# Patient Record
Sex: Female | Born: 1985 | Race: Black or African American | Hispanic: No | Marital: Single | Smoking: Former smoker
Health system: Southern US, Community
[De-identification: ages and names within clinical notes are randomized; demographics above are authoritative.]

## PROBLEM LIST (undated history)

## (undated) ENCOUNTER — Inpatient Hospital Stay (HOSPITAL_COMMUNITY): Payer: Self-pay

## (undated) DIAGNOSIS — N898 Other specified noninflammatory disorders of vagina: Secondary | ICD-10-CM

## (undated) DIAGNOSIS — O24419 Gestational diabetes mellitus in pregnancy, unspecified control: Secondary | ICD-10-CM

## (undated) DIAGNOSIS — F32A Depression, unspecified: Secondary | ICD-10-CM

## (undated) DIAGNOSIS — R350 Frequency of micturition: Secondary | ICD-10-CM

## (undated) DIAGNOSIS — B379 Candidiasis, unspecified: Secondary | ICD-10-CM

## (undated) DIAGNOSIS — F419 Anxiety disorder, unspecified: Secondary | ICD-10-CM

## (undated) DIAGNOSIS — B009 Herpesviral infection, unspecified: Secondary | ICD-10-CM

## (undated) DIAGNOSIS — E559 Vitamin D deficiency, unspecified: Secondary | ICD-10-CM

## (undated) DIAGNOSIS — N760 Acute vaginitis: Secondary | ICD-10-CM

## (undated) DIAGNOSIS — B9689 Other specified bacterial agents as the cause of diseases classified elsewhere: Secondary | ICD-10-CM

## (undated) DIAGNOSIS — N926 Irregular menstruation, unspecified: Secondary | ICD-10-CM

## (undated) DIAGNOSIS — F329 Major depressive disorder, single episode, unspecified: Secondary | ICD-10-CM

## (undated) HISTORY — DX: Other specified bacterial agents as the cause of diseases classified elsewhere: B96.89

## (undated) HISTORY — DX: Irregular menstruation, unspecified: N92.6

## (undated) HISTORY — DX: Gestational diabetes mellitus in pregnancy, unspecified control: O24.419

## (undated) HISTORY — PX: WISDOM TOOTH EXTRACTION: SHX21

## (undated) HISTORY — DX: Other specified noninflammatory disorders of vagina: N89.8

## (undated) HISTORY — DX: Frequency of micturition: R35.0

## (undated) HISTORY — DX: Acute vaginitis: N76.0

## (undated) HISTORY — DX: Anxiety disorder, unspecified: F41.9

## (undated) HISTORY — DX: Major depressive disorder, single episode, unspecified: F32.9

## (undated) HISTORY — DX: Candidiasis, unspecified: B37.9

## (undated) HISTORY — DX: Depression, unspecified: F32.A

## (undated) HISTORY — DX: Vitamin D deficiency, unspecified: E55.9

---
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
## 2004-04-05 ENCOUNTER — Ambulatory Visit (HOSPITAL_COMMUNITY): Admission: RE | Admit: 2004-04-05 | Discharge: 2004-04-05 | Payer: Self-pay | Admitting: Obstetrics and Gynecology

## 2004-04-08 ENCOUNTER — Inpatient Hospital Stay (HOSPITAL_COMMUNITY): Admission: RE | Admit: 2004-04-08 | Discharge: 2004-04-10 | Payer: Self-pay | Admitting: Obstetrics and Gynecology

## 2004-06-13 ENCOUNTER — Emergency Department (HOSPITAL_COMMUNITY): Admission: EM | Admit: 2004-06-13 | Discharge: 2004-06-13 | Payer: Self-pay | Admitting: Emergency Medicine

## 2004-08-19 ENCOUNTER — Emergency Department (HOSPITAL_COMMUNITY): Admission: EM | Admit: 2004-08-19 | Discharge: 2004-08-19 | Payer: Self-pay | Admitting: *Deleted

## 2005-09-09 ENCOUNTER — Inpatient Hospital Stay (HOSPITAL_COMMUNITY): Admission: RE | Admit: 2005-09-09 | Discharge: 2005-09-11 | Payer: Self-pay | Admitting: Obstetrics and Gynecology

## 2006-11-21 ENCOUNTER — Other Ambulatory Visit: Admission: RE | Admit: 2006-11-21 | Discharge: 2006-11-21 | Payer: Self-pay | Admitting: Obstetrics and Gynecology

## 2007-08-19 ENCOUNTER — Emergency Department (HOSPITAL_COMMUNITY): Admission: EM | Admit: 2007-08-19 | Discharge: 2007-08-19 | Payer: Self-pay | Admitting: Emergency Medicine

## 2007-10-27 ENCOUNTER — Ambulatory Visit: Payer: Self-pay | Admitting: Psychiatry

## 2007-10-28 ENCOUNTER — Inpatient Hospital Stay (HOSPITAL_COMMUNITY): Admission: AD | Admit: 2007-10-28 | Discharge: 2007-11-11 | Payer: Self-pay | Admitting: Psychiatry

## 2007-11-27 ENCOUNTER — Emergency Department (HOSPITAL_COMMUNITY): Admission: EM | Admit: 2007-11-27 | Discharge: 2007-11-27 | Payer: Self-pay | Admitting: Emergency Medicine

## 2007-12-05 ENCOUNTER — Other Ambulatory Visit: Admission: RE | Admit: 2007-12-05 | Discharge: 2007-12-05 | Payer: Self-pay | Admitting: Obstetrics and Gynecology

## 2008-11-05 ENCOUNTER — Emergency Department (HOSPITAL_COMMUNITY): Admission: EM | Admit: 2008-11-05 | Discharge: 2008-11-05 | Payer: Self-pay | Admitting: Emergency Medicine

## 2008-12-28 ENCOUNTER — Emergency Department (HOSPITAL_COMMUNITY): Admission: EM | Admit: 2008-12-28 | Discharge: 2008-12-28 | Payer: Self-pay | Admitting: Emergency Medicine

## 2009-02-03 ENCOUNTER — Other Ambulatory Visit: Admission: RE | Admit: 2009-02-03 | Discharge: 2009-02-03 | Payer: Self-pay | Admitting: Obstetrics and Gynecology

## 2009-11-26 ENCOUNTER — Inpatient Hospital Stay (HOSPITAL_COMMUNITY)
Admission: AD | Admit: 2009-11-26 | Discharge: 2009-11-28 | Payer: Self-pay | Source: Home / Self Care | Admitting: Obstetrics & Gynecology

## 2009-11-26 ENCOUNTER — Ambulatory Visit: Payer: Self-pay | Admitting: Family Medicine

## 2010-02-16 ENCOUNTER — Emergency Department (HOSPITAL_COMMUNITY)
Admission: EM | Admit: 2010-02-16 | Discharge: 2010-02-16 | Payer: Self-pay | Source: Home / Self Care | Admitting: Emergency Medicine

## 2010-02-16 ENCOUNTER — Other Ambulatory Visit
Admission: RE | Admit: 2010-02-16 | Discharge: 2010-02-16 | Payer: Self-pay | Source: Home / Self Care | Admitting: Obstetrics and Gynecology

## 2010-05-14 LAB — CBC
MCH: 31.6 pg (ref 26.0–34.0)
MCH: 32.6 pg (ref 26.0–34.0)
MCV: 95.2 fL (ref 78.0–100.0)
MCV: 96 fL (ref 78.0–100.0)
Platelets: 173 10*3/uL (ref 150–400)
RBC: 3.58 MIL/uL — ABNORMAL LOW (ref 3.87–5.11)
RBC: 4.03 MIL/uL (ref 3.87–5.11)
RDW: 15.1 % (ref 11.5–15.5)
WBC: 14.1 10*3/uL — ABNORMAL HIGH (ref 4.0–10.5)

## 2010-06-04 LAB — URINALYSIS, ROUTINE W REFLEX MICROSCOPIC
Glucose, UA: NEGATIVE mg/dL
Nitrite: NEGATIVE
Specific Gravity, Urine: 1.015 (ref 1.005–1.030)
pH: 7 (ref 5.0–8.0)

## 2010-06-04 LAB — COMPREHENSIVE METABOLIC PANEL
AST: 17 U/L (ref 0–37)
Albumin: 3.8 g/dL (ref 3.5–5.2)
Alkaline Phosphatase: 54 U/L (ref 39–117)
BUN: 12 mg/dL (ref 6–23)
Calcium: 8.9 mg/dL (ref 8.4–10.5)
Chloride: 104 mEq/L (ref 96–112)
Creatinine, Ser: 0.61 mg/dL (ref 0.4–1.2)
Sodium: 137 mEq/L (ref 135–145)
Total Bilirubin: 1 mg/dL (ref 0.3–1.2)
Total Protein: 6.6 g/dL (ref 6.0–8.3)

## 2010-06-04 LAB — CBC
Hemoglobin: 13.1 g/dL (ref 12.0–15.0)
Platelets: 186 10*3/uL (ref 150–400)
RDW: 13.2 % (ref 11.5–15.5)
WBC: 8.5 10*3/uL (ref 4.0–10.5)

## 2010-06-04 LAB — DIFFERENTIAL
Basophils Absolute: 0 10*3/uL (ref 0.0–0.1)
Lymphs Abs: 3.5 10*3/uL (ref 0.7–4.0)
Neutro Abs: 3.8 10*3/uL (ref 1.7–7.7)

## 2010-06-05 LAB — URINALYSIS, ROUTINE W REFLEX MICROSCOPIC
Nitrite: NEGATIVE
pH: 6 (ref 5.0–8.0)

## 2010-06-05 LAB — URINE CULTURE

## 2010-06-05 LAB — RAPID STREP SCREEN (MED CTR MEBANE ONLY): Streptococcus, Group A Screen (Direct): NEGATIVE

## 2010-06-05 LAB — URINE MICROSCOPIC-ADD ON

## 2010-06-05 LAB — PREGNANCY, URINE: Preg Test, Ur: NEGATIVE

## 2010-07-14 NOTE — H&P (Signed)
[name], [name]            ACCOUNT NO.:  [id number]   MEDICAL RECORD NO.:  [id number]          PATIENT TYPE:  IPS   LOCATION:  0403                          FACILITY:  [facility]   PHYSICIAN:  Anselm Jungling, MD  DATE OF BIRTH:  26-Dec-1985   DATE OF ADMISSION:  10/28/2007  DATE OF DISCHARGE:                       PSYCHIATRIC ADMISSION ASSESSMENT   This is an involuntary admission to the services of Anselm Jungling,  M.D.  This is a 25 year old single [place] American female whose most  notable attribute is her multicolored hair.  It is quite unusual.  The  commitment papers indicate that the respondent is psychotic, she was  responding to internal stimuli and currently a danger to herself.  She  was brought to the emergency room at [facility] with a chief  complaint of altered level of consciousness.  According to the patient's  sister, the patient had been threatening suicide.  She had been under a  lot of family stress.  She is known to use marijuana; indeed, her UDS  was positive for marijuana.  She had no alcohol, and her UA showed that  she was beginning a UTI.  She had a small amount of leukocyte esterase  positive.  She is on her period, and she is concerned about STDs and  wants to be checked.  Yesterday, prior to getting a shot of Geodon, she  was somewhat combative.  She had to be restrained.  Apparently she has  not been sleeping well and has not been eating well for the last week or  so, and the Geodon seemed to have brought her around.  She received 10  mg IM in the ED and she has not required any further medication.   PAST PSYCHIATRIC HISTORY:  She presented to her [facility], Dr. Sudie Bailey, in  February.  He started her on Lexapro.  She became noncompliant with the  Lexapro approximately 2 months ago.  No real reason other than she had  not been back for followup and did not have refills.  She states that it  was helpful.   SOCIAL HISTORY:  She is a high school  graduate in 2005.  She is not  married.  She has a 71-year-old son and a 67-year-old daughter.  Their  father is incarcerated due to drugs.  The children are with her sister.  She has been unemployed since last year, and apparently various family  members are financially supporting her and the children.   FAMILY HISTORY:  She thinks her mother has anxiety.  She is not sure  what medications her mother is prescribed.   ALCOHOL AND DRUG HISTORY:  She states that she only uses marijuana on  and off.   PRIMARY CARE [name]:  Mila Homer. Sudie Bailey, M.D.   MEDICAL PROBLEMS:  None that she is aware of.   MEDICATIONS:  She was prescribed Lexapro 20 mg but she has not taken  this in about 2 months.   DRUG ALLERGIES:  No known drug allergies.   POSITIVE PHYSICAL FINDINGS:  She was medically cleared in the ED at  [facility].  She  does have a UTI as noted with the small amount of  leukocyte esterase positive in her urine.  The remainder of her lab work  was unremarkable.  Her vital signs on admission to the unit show that  she is 62 inches tall, she weighs 153, her temperature is 98.6, her  blood pressure was 123/69 to 127/81, pulse was 89 to 97, respirations  18.  She does have several tattoos.  Please see anatomic drawing for  location and description.  She does have a history for multiple STDs and  she is complaining today of vaginal itch.   MENTAL STATUS EXAM:  Today she is alert and oriented.  She is  appropriately groomed, dressed, and appears to be adequately nourished.  Her speech was a normal rate, rhythm and tone.  Her mood is calm.  It is  appropriate to the situation.  Her affect is congruent.  Thought  processes are clear, rational and goal oriented.  She would like to  restart the Lexapro.  Judgment and insight are good.  Concentration and  memory are intact.  Intelligence is at least average.  She denies being  suicidal or homicidal.  She denies auditory or visual  hallucinations.  She states that the Geodon cleared that up.   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  Marijuana abuse.  AXIS II:  Deferred.  AXIS III:  History for STDs, currently has urinary tract infection.  AXIS IV:  Severe financial issues and unemployment.  AXIS V:  30.   The plan is to admit for safety and stabilization.  We will restart her  Lexapro.  We will treat her UTI and rule out any STDs.  Estimated length  of stay is 3 days.      Mickie Leonarda Salon, P.A.-C.      Anselm Jungling, MD  Electronically Signed    MD/MEDQ  D:  10/28/2007  T:  10/28/2007  Job:  [id number]

## 2010-07-14 NOTE — Discharge Summary (Signed)
[name], SEEDORF            ACCOUNT NO.:  [id number]   MEDICAL RECORD NO.:  [id number]          PATIENT TYPE:  IPS   LOCATION:  0400                          FACILITY:  [facility]   PHYSICIAN:  Anselm Jungling, MD  DATE OF BIRTH:  [birth year]   DATE OF ADMISSION:  10/28/2007  DATE OF DISCHARGE:  11/11/2007                               DISCHARGE SUMMARY   IDENTIFYING DATA AND REASON FOR ADMISSION:  The patient is a 25 year old  single African American female who was admitted due to psychotic  symptoms.  Please refer to the admission note for further details  pertaining to the symptoms, circumstances and history that led to her  hospitalization.  She was given an initial Axis I diagnosis of psychosis  not otherwise specified.   MEDICAL AND LABORATORY:  The patient was admitted with a urinary tract  infection.  She was treated with Bactrim DS 1 tablet b.i.d.  There were  no other medical issues.   HOSPITAL COURSE:  The patient was admitted to the adult inpatient  psychiatric service.  She presented as a well-nourished, normally-  developed [place] American female with multicolored hair.  She showed  vague and latent responses.  She could not explain why she was in the  hospital.  She made vague references to not being able to control her  behavior, but was unable to be specific.  She appeared to have limited  insight into mental illness.   She was treated with a psychotropic regimen initially consisting of  Risperdal.  This was well tolerated, but the patient made minimal  progress.  After approximately 9 days of inpatient treatment, it was  noted that she was beginning to display an affective component,  consisting of a good deal of inappropriate laughter, mild euphoria and  attempting to play childish tricks on the staff and then laughing about  it.  Because of this, Depakote was added to the regimen.  Within 3 days,  the patient's affect had stabilized significantly, her  thinking was more  realistic and she reported that she was feeling better.   The case manager worked with the patient's family towards discharge and  aftercare planning.  This involved the patient's aunt, who had been away  in the early part of the patient's stay, but was able to be involved in  the latter part of the patient's stay.   The patient was discharged on the 15th hospital day, much improved.  She  agreed to the following aftercare plan.   AFTERCARE:  The patient was to follow up with [facility] with  an appointment on November 15, 2007.   DISCHARGE MEDICATIONS:  1. Lexapro 10 mg daily.  2. Depakote 1000 mg q.h.s.  3. Risperdal 6 mg q.h.s.  4. Septra/Bactrim DS 1 tablet twice daily through September 15.   DISCHARGE DIAGNOSES:  AXIS I:  Schizoaffective disorder not otherwise  specified.  AXIS II:  Deferred.  AXIS III:  Status post urinary tract infection.  AXIS IV:  Stressors severe.  AXIS V:  GAF on discharge 55.      Anselm Jungling,  MD  Electronically Signed     SPB/MEDQ  D:  11/11/2007  T:  11/12/2007  Job:  [id number]

## 2010-07-17 NOTE — H&P (Signed)
[name], [name]            ACCOUNT NO.:  [id number]   MEDICAL RECORD NO.:  [id number]          PATIENT TYPE:  INP   LOCATION:  LDR4                          FACILITY:  [facility]   PHYSICIAN:  [name], M.D. DATE OF BIRTH:  Apr 12, 1985   DATE OF ADMISSION:  09/09/2005  DATE OF DISCHARGE:  LH                                HISTORY & PHYSICAL   REASON FOR ADMISSION:  Pregnancy at 39 weeks, in active labor.   HISTORY OF PRESENT ILLNESS:  [name] was awakened about midnight with  uterine contractions and now presents at 6 cm, in active labor.   MEDICAL HISTORY:  Negative.   SURGICAL HISTORY:  Negative.   ALLERGIES:  She has no known allergies.   MEDICATIONS:  Prenatal vitamins and Valtrex.   FAMILY HISTORY:  Family history is positive for hypertension, coronary heart  disease and diabetes.   PRENATAL COURSE:  Uneventful.  Blood type is A positive.  UDS negative.  Rubella is immune.  Hepatitis B surface antigen negative.  HIV is negative.  HSV is positive.  Serology is nonreactive.  Pap normal.  GC and Chlamydia  are both negative.  Prior GBS was positive.  Twenty-eight-week hemoglobin  10.1, hematocrit 31.5.  One-hour glucose 153; three-hour is as follows --  79, 183, 147 and 108.   PHYSICAL EXAMINATION:  VITAL SIGNS:  Stable.  PELVIC:  Cervix 6 cm, 0 station, bulging membranes, active labor.  ABDOMEN:  Fetus is stable and has reactive heart rhythm.   PLAN:  Anticipate vaginal delivery.     [name], [name]      [name], M.D.  Electronically Signed   DL/MEDQ  D:  04/54/0981  T:  09/09/2005  Job:  [id number]

## 2010-07-17 NOTE — H&P (Signed)
NAMEREWA, WEISSBERG            ACCOUNT NO.:  [id number]   MEDICAL RECORD NO.:  [id number]          PATIENT TYPE:  INP   LOCATION:  LDR1                          [facility]ILITY:  [facility]   PHYSICIAN:  Tilda Burrow, M.D. DATE OF BIRTH:  [birth year]   DATE OF ADMISSION:  04/08/2004  DATE OF DISCHARGE:  LH                                HISTORY & PHYSICAL   LENGTH OF FIRST STAGE LABOR:  Eight hours and 25 minutes.   LENGTH OF SECOND STAGE LABOR:  26 minutes.   LENGTH OF THIRD STAGE LABOR:  11 minutes.   DELIVERY NOTE:  Elysha had a spontaneous vaginal delivery from an OP  position of a viable female infant.  Upon delivery of head, nuchal cord was  noted x1 which was loosened easily and infant slipped through without any  difficulty.  Upon delivery infant had spontaneous movement of all  extremities, good strong cry, and pinked up readily after delivery.  Infant  was thoroughly suctioned and dried, cord clamped and cut, and placed on  mother's abdomen in good condition.  Upon inspection perineum is noted to be  intact.  Third stage of labor was actively managed with 20 units of Pitocin  and 1000 cc of crystalloid_at a rapid rate.  Placenta was delivered  spontaneously via Tomasa Blase' mechanism.  Three vessel cord is noted upon  inspection.  Membranes are noted to be intact upon inspection.  Estimated  blood loss is approximately 350 cc.  Infant and mother stabilized and  transferred out to the postpartum unit in stable condition.      DL/MEDQ  D:  62/95/2841  T:  04/08/2004  Job:  [id number]

## 2010-07-17 NOTE — H&P (Signed)
NAMEADREENA, [name]            ACCOUNT NO.:  [id number]   MEDICAL RECORD NO.:  [id number]          PATIENT TYPE:  INP   LOCATION:  LDR1                          [facility]  [facility]   PHYSICIAN:  Tilda Burrow, M.D. DATE OF BIRTH:  1986-01-08   DATE OF ADMISSION:  04/08/2004  DATE OF DISCHARGE:  LH                                HISTORY & PHYSICAL   REASON FOR ADMISSION:  Pregnancy at 38 weeks approximately with early labor.   HISTORY OF PRESENT ILLNESS:  [name] presented at 3 a.m. this morning  having mild contractions about 5-10 minutes apart.  At that time she was 1  cm.  Now at 9:20 a.m. she is 3 cm, 90% effaced, and 0 station.  The  membranes are bulging.  The contractions are still approximately 5-7 minutes  apart, mild-to-moderate to palpation, but she is making cervical change.   MEDICAL HISTORY:  Positive for migraines.   SURGICAL HISTORY:  Negative.   ALLERGIES:  She has no known allergies.   MEDICATIONS:  1.  Valtrex.  2.  Prenatal vitamins.   FAMILY HISTORY:  Positive for hypertension, diabetes, and coronary artery  disease.   PRENATAL COURSE:  Essentially uneventful.  Blood type is A positive.  Rubella is immune.  Hepatitis B surface antigen is negative.  HIV is  negative.  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  Serology is nonreactive.  AFP is within  normal limits.  A 28-week hemoglobin is 11, hematocrit 34.9.  A one hour  glucose is 160.  A three hour is as follows:  166, 163, and 134.  Sickle  cell screen is negative.  She is GBF positive.   PHYSICAL EXAMINATION:  VITAL SIGNS:  Stable.  Fetal heart rate is stable  with A cells.  PELVIC:  Cervix is 3, 90, and 0 station.  Membranes are bulging.  Contraction pattern is still mild, anywhere from 5-7 minutes apart.   PLAN:  We are going admit, ambulate.  Start IV antibiotics for positive GBS  status and probable Pitocin augmentation of labor.      DL/MEDQ  D:  16/11/9602  T:  04/08/2004  Job:  [id number]   cc:   [facility]

## 2010-07-17 NOTE — Op Note (Signed)
[name], [name]            ACCOUNT NO.:  [id number]   MEDICAL RECORD NO.:  [id number]          PATIENT TYPE:  INP   LOCATION:  LDR4                          FACILITY:  [facility]   PHYSICIAN:  Tilda Burrow, M.D. DATE OF BIRTH:  01/20/1986   DATE OF PROCEDURE:  DATE OF DISCHARGE:                                  PROCEDURE NOTE   DELIVERY SUMMARY   ONSET OF LABOR:  September 09, 2005.   DATE OF DELIVERY:  September 09, 2005, at 03:12 a.m.   LENGTH OF FIRST STAGE LABOR:  3-1/2 hours.   LENGTH OF SECOND STAGE LABOR:  14 minutes.   LENGTH OF THIRD STAGE LABOR:  8 minutes.   DELIVERY NOTE:  [name] had a normal spontaneous vaginal livery of a viable  female infant.  There was spontaneous delivery of entire infant.  Upon  delivery infant had strong cry, good movement, pinked up well.  Cord was  clamped and cut.  Infant suctioned, thoroughly dried, and placed _on warmer_  for newborn care.  Upon inspection perineum was noted to be intact.  Third  stage of labor was __ managed with 20 units of Pitocin, 1000 mL of _infusion  at a_ rapid rate.  The placenta was delivered spontaneously via Schultze  mechanism.  Membranes were noted to be intact.  There is _3-vessel_ cord.  Cord blood gas and cord blood was obtained and sent to the lab.  Estimated  blood loss was approximately 400 mL.      [name], Lanier Clam      Tilda Burrow, M.D.  Electronically Signed    DL/MEDQ  D:  16/11/9602  T:  09/09/2005  Job:  [id number]   cc:   Jeoffrey Massed, MD  Fax: [phone]

## 2010-11-24 ENCOUNTER — Other Ambulatory Visit: Payer: Self-pay | Admitting: Obstetrics and Gynecology

## 2010-11-26 LAB — RAPID URINE DRUG SCREEN, HOSP PERFORMED
Barbiturates: NOT DETECTED
Cocaine: NOT DETECTED
Opiates: NOT DETECTED
Tetrahydrocannabinol: POSITIVE — AB

## 2010-11-26 LAB — URINALYSIS, ROUTINE W REFLEX MICROSCOPIC
Bilirubin Urine: NEGATIVE
Nitrite: NEGATIVE
Specific Gravity, Urine: 1.025
pH: 6

## 2010-12-02 LAB — URINE CULTURE: Special Requests: NEGATIVE

## 2010-12-02 LAB — URINALYSIS, ROUTINE W REFLEX MICROSCOPIC
Bilirubin Urine: NEGATIVE
Glucose, UA: NEGATIVE
Specific Gravity, Urine: 1.027
pH: 6.5

## 2010-12-02 LAB — URINE MICROSCOPIC-ADD ON

## 2011-04-12 ENCOUNTER — Other Ambulatory Visit: Payer: Self-pay | Admitting: Obstetrics & Gynecology

## 2011-04-12 ENCOUNTER — Other Ambulatory Visit (HOSPITAL_COMMUNITY)
Admission: RE | Admit: 2011-04-12 | Discharge: 2011-04-12 | Disposition: A | Discharge status: Home / Self Care | Payer: Medicaid Other | Source: Ambulatory Visit | Attending: Obstetrics & Gynecology | Admitting: Obstetrics & Gynecology

## 2011-04-12 DIAGNOSIS — Z113 Encounter for screening for infections with a predominantly sexual mode of transmission: Secondary | ICD-10-CM | POA: Insufficient documentation

## 2011-04-12 DIAGNOSIS — Z01419 Encounter for gynecological examination (general) (routine) without abnormal findings: Secondary | ICD-10-CM | POA: Insufficient documentation

## 2011-05-21 ENCOUNTER — Other Ambulatory Visit: Payer: Self-pay | Admitting: Obstetrics and Gynecology

## 2012-04-24 ENCOUNTER — Other Ambulatory Visit (HOSPITAL_COMMUNITY)
Admission: RE | Admit: 2012-04-24 | Discharge: 2012-04-24 | Disposition: A | Discharge status: Home / Self Care | Payer: Medicaid Other | Source: Ambulatory Visit | Attending: Obstetrics and Gynecology | Admitting: Obstetrics and Gynecology

## 2012-04-24 ENCOUNTER — Other Ambulatory Visit: Payer: Self-pay | Admitting: Adult Health

## 2012-04-24 DIAGNOSIS — Z01419 Encounter for gynecological examination (general) (routine) without abnormal findings: Secondary | ICD-10-CM | POA: Insufficient documentation

## 2012-04-24 DIAGNOSIS — Z113 Encounter for screening for infections with a predominantly sexual mode of transmission: Secondary | ICD-10-CM | POA: Insufficient documentation

## 2012-08-15 ENCOUNTER — Other Ambulatory Visit: Payer: Self-pay | Admitting: *Deleted

## 2012-08-15 MED ORDER — METRONIDAZOLE 250 MG PO TABS: 250.0000 mg | ORAL_TABLET | Freq: Three times a day (TID) | ORAL | Status: DC

## 2012-09-02 ENCOUNTER — Inpatient Hospital Stay (HOSPITAL_COMMUNITY)
Admission: EM | Admit: 2012-09-02 | Discharge: 2012-09-07 | DRG: 419 | Disposition: A | Discharge status: Home / Self Care | Payer: Medicaid Other | Attending: Internal Medicine | Admitting: Internal Medicine

## 2012-09-02 ENCOUNTER — Emergency Department (HOSPITAL_COMMUNITY): Payer: Medicaid Other

## 2012-09-02 ENCOUNTER — Encounter (HOSPITAL_COMMUNITY): Payer: Self-pay | Admitting: Emergency Medicine

## 2012-09-02 DIAGNOSIS — K8050 Calculus of bile duct without cholangitis or cholecystitis without obstruction: Secondary | ICD-10-CM

## 2012-09-02 DIAGNOSIS — R932 Abnormal findings on diagnostic imaging of liver and biliary tract: Secondary | ICD-10-CM

## 2012-09-02 DIAGNOSIS — F329 Major depressive disorder, single episode, unspecified: Secondary | ICD-10-CM | POA: Diagnosis present

## 2012-09-02 DIAGNOSIS — E875 Hyperkalemia: Secondary | ICD-10-CM | POA: Diagnosis present

## 2012-09-02 DIAGNOSIS — F3289 Other specified depressive episodes: Secondary | ICD-10-CM | POA: Diagnosis present

## 2012-09-02 DIAGNOSIS — R3911 Hesitancy of micturition: Secondary | ICD-10-CM | POA: Diagnosis present

## 2012-09-02 DIAGNOSIS — R109 Unspecified abdominal pain: Secondary | ICD-10-CM

## 2012-09-02 DIAGNOSIS — K8071 Calculus of gallbladder and bile duct without cholecystitis with obstruction: Principal | ICD-10-CM | POA: Diagnosis present

## 2012-09-02 DIAGNOSIS — R110 Nausea: Secondary | ICD-10-CM | POA: Diagnosis present

## 2012-09-02 DIAGNOSIS — R1013 Epigastric pain: Secondary | ICD-10-CM | POA: Diagnosis present

## 2012-09-02 DIAGNOSIS — E876 Hypokalemia: Secondary | ICD-10-CM | POA: Diagnosis not present

## 2012-09-02 DIAGNOSIS — K8021 Calculus of gallbladder without cholecystitis with obstruction: Secondary | ICD-10-CM | POA: Diagnosis present

## 2012-09-02 DIAGNOSIS — F17200 Nicotine dependence, unspecified, uncomplicated: Secondary | ICD-10-CM | POA: Diagnosis present

## 2012-09-02 DIAGNOSIS — R112 Nausea with vomiting, unspecified: Secondary | ICD-10-CM | POA: Diagnosis present

## 2012-09-02 LAB — URINALYSIS, ROUTINE W REFLEX MICROSCOPIC
Glucose, UA: NEGATIVE mg/dL
Ketones, ur: NEGATIVE mg/dL
Leukocytes, UA: NEGATIVE
Nitrite: NEGATIVE
Protein, ur: 30 mg/dL — AB
Urobilinogen, UA: 1 mg/dL (ref 0.0–1.0)

## 2012-09-02 LAB — CBC WITH DIFFERENTIAL/PLATELET
Basophils Absolute: 0 10*3/uL (ref 0.0–0.1)
Basophils Relative: 0 % (ref 0–1)
Eosinophils Relative: 1 % (ref 0–5)
HCT: 39.5 % (ref 36.0–46.0)
Hemoglobin: 13.5 g/dL (ref 12.0–15.0)
MCHC: 34.2 g/dL (ref 30.0–36.0)
MCV: 92.3 fL (ref 78.0–100.0)
Monocytes Absolute: 0.7 10*3/uL (ref 0.1–1.0)
Monocytes Relative: 7 % (ref 3–12)
RDW: 13.3 % (ref 11.5–15.5)

## 2012-09-02 LAB — LIPASE, BLOOD: Lipase: 40 U/L (ref 11–59)

## 2012-09-02 LAB — COMPREHENSIVE METABOLIC PANEL
AST: 814 U/L — ABNORMAL HIGH (ref 0–37)
Albumin: 3.8 g/dL (ref 3.5–5.2)
BUN: 9 mg/dL (ref 6–23)
CO2: 25 mEq/L (ref 19–32)
Calcium: 9 mg/dL (ref 8.4–10.5)
Creatinine, Ser: 0.55 mg/dL (ref 0.50–1.10)
GFR calc non Af Amer: >90 mL/min (ref >90)

## 2012-09-02 LAB — POCT PREGNANCY, URINE: Preg Test, Ur: NEGATIVE

## 2012-09-02 MED ORDER — PANTOPRAZOLE SODIUM 40 MG IV SOLR
40.0000 mg | Freq: Two times a day (BID) | INTRAVENOUS | Status: DC
  Administered 2012-09-02 – 2012-09-06 (×8): 40 mg via INTRAVENOUS
  Filled 2012-09-02 (×14): qty 40

## 2012-09-02 MED ORDER — MORPHINE SULFATE 4 MG/ML IJ SOLN
4.0000 mg | Freq: Once | INTRAMUSCULAR | Status: AC
  Administered 2012-09-02: 4 mg via INTRAVENOUS
  Filled 2012-09-02: qty 1

## 2012-09-02 MED ORDER — SERTRALINE HCL 50 MG PO TABS
50.0000 mg | ORAL_TABLET | Freq: Every day | ORAL | Status: DC
  Administered 2012-09-02 – 2012-09-07 (×5): 50 mg via ORAL
  Filled 2012-09-02 (×7): qty 1

## 2012-09-02 MED ORDER — ACETAMINOPHEN 325 MG PO TABS: 650.0000 mg | ORAL_TABLET | Freq: Four times a day (QID) | ORAL | Status: DC | PRN

## 2012-09-02 MED ORDER — ALUM & MAG HYDROXIDE-SIMETH 200-200-20 MG/5ML PO SUSP: 30.0000 mL | Freq: Four times a day (QID) | ORAL | Status: DC | PRN

## 2012-09-02 MED ORDER — POTASSIUM CHLORIDE CRYS ER 20 MEQ PO TBCR
40.0000 meq | EXTENDED_RELEASE_TABLET | Freq: Once | ORAL | Status: AC
  Administered 2012-09-02: 40 meq via ORAL
  Filled 2012-09-02: qty 2

## 2012-09-02 MED ORDER — SODIUM CHLORIDE 0.9 % IV SOLN
INTRAVENOUS | Status: DC
  Administered 2012-09-02 – 2012-09-03 (×2): via INTRAVENOUS

## 2012-09-02 MED ORDER — ONDANSETRON HCL 4 MG/2ML IJ SOLN
4.0000 mg | Freq: Once | INTRAMUSCULAR | Status: AC
  Administered 2012-09-02: 4 mg via INTRAVENOUS
  Filled 2012-09-02: qty 2

## 2012-09-02 MED ORDER — ONDANSETRON HCL 4 MG PO TABS: 4.0000 mg | ORAL_TABLET | Freq: Four times a day (QID) | ORAL | Status: DC | PRN

## 2012-09-02 MED ORDER — ZOLPIDEM TARTRATE 5 MG PO TABS
5.0000 mg | ORAL_TABLET | Freq: Every evening | ORAL | Status: DC | PRN
  Administered 2012-09-05: 5 mg via ORAL
  Filled 2012-09-02: qty 1

## 2012-09-02 MED ORDER — ONDANSETRON HCL 4 MG/2ML IJ SOLN
4.0000 mg | Freq: Four times a day (QID) | INTRAMUSCULAR | Status: DC | PRN
  Administered 2012-09-04: 4 mg via INTRAVENOUS
  Filled 2012-09-02: qty 2

## 2012-09-02 MED ORDER — POTASSIUM CHLORIDE CRYS ER 20 MEQ PO TBCR
EXTENDED_RELEASE_TABLET | ORAL | Status: AC
  Filled 2012-09-02: qty 1

## 2012-09-02 MED ORDER — OXYCODONE HCL 5 MG PO TABS
5.0000 mg | ORAL_TABLET | ORAL | Status: DC | PRN
  Administered 2012-09-04 – 2012-09-05 (×2): 5 mg via ORAL
  Filled 2012-09-02 (×2): qty 1

## 2012-09-02 MED ORDER — POTASSIUM CHLORIDE CRYS ER 20 MEQ PO TBCR
40.0000 meq | EXTENDED_RELEASE_TABLET | Freq: Once | ORAL | Status: AC
  Administered 2012-09-02: 40 meq via ORAL
  Filled 2012-09-02: qty 1

## 2012-09-02 MED ORDER — ACETAMINOPHEN 650 MG RE SUPP
650.0000 mg | Freq: Four times a day (QID) | RECTAL | Status: DC | PRN
  Administered 2012-09-03 – 2012-09-04 (×2): 650 mg via RECTAL
  Filled 2012-09-02 (×2): qty 1

## 2012-09-02 MED ORDER — FENTANYL CITRATE 0.05 MG/ML IJ SOLN
50.0000 ug | Freq: Once | INTRAMUSCULAR | Status: AC
  Administered 2012-09-02: 50 ug via INTRAVENOUS
  Filled 2012-09-02: qty 2

## 2012-09-02 MED ORDER — HYDROMORPHONE HCL PF 1 MG/ML IJ SOLN
0.5000 mg | INTRAMUSCULAR | Status: DC | PRN
  Administered 2012-09-02 – 2012-09-06 (×22): 1 mg via INTRAVENOUS
  Filled 2012-09-02 (×22): qty 1

## 2012-09-02 MED ORDER — GI COCKTAIL ~~LOC~~
30.0000 mL | Freq: Once | ORAL | Status: AC
  Administered 2012-09-02: 30 mL via ORAL
  Filled 2012-09-02: qty 30

## 2012-09-02 NOTE — ED Notes (Signed)
Hx of recent yeast infection, Flagyl Rx finished

## 2012-09-02 NOTE — ED Provider Notes (Signed)
History    CSN: [id number] Arrival date & time 09/02/12  1341  First MD Initiated Contact with Patient 09/02/12 1455     Chief Complaint  Patient presents with  . Abdominal Pain   (Consider location/radiation/quality/duration/timing/severity/associated sxs/prior Treatment) HPI Comments: 27 y.o. Female with no significant PMHx presents today with gradual onset abdominal pain that started this morning after she woke up about 7am. She describes the pain as sharp, constant, severe "10/10," epigastric, radiating to the left thoracic area at times. Pt tried lying down to feel better, but it did not help. Admits nausea.   Pt states that her stomach was bothering her as she went to work. At work, she ate a full breakfast and noticed the pain was worse after eating. She went to the bathroom, hoping to experience relief. She had a normal bowel movement, but pain persisted so she went home to lay down. Denies fevers, diarrhea, vomiting, dysuria, hematuria, recent illness, sick contacts.   LMP was 2 weeks ago.   Patient is a 27 y.o. female presenting with abdominal pain.  Abdominal Pain Associated symptoms include abdominal pain. Pertinent negatives include no chest pain, diaphoresis, fever, headaches, nausea, neck pain, numbness, rash, vomiting or weakness.   History reviewed. No pertinent past medical history. History reviewed. No pertinent past surgical history. History reviewed. No pertinent family history. History  Substance Use Topics  . Smoking status: Current Every Day Smoker  . Smokeless tobacco: Not on file  . Alcohol Use: Yes   OB History   Grav Para Term Preterm Abortions TAB SAB Ect Mult Living                 Review of Systems  Constitutional: Negative for fever and diaphoresis.  HENT: Negative for neck pain and neck stiffness.   Eyes: Negative for visual disturbance.  Respiratory: Negative for apnea, chest tightness and shortness of breath.   Cardiovascular: Negative for  chest pain and palpitations.  Gastrointestinal: Positive for abdominal pain. Negative for nausea, vomiting, diarrhea and constipation.       Epigastric radiating to lower central abdomen  Genitourinary: Negative for dysuria.  Musculoskeletal: Negative for gait problem.  Skin: Negative for rash.  Neurological: Negative for dizziness, weakness, light-headedness, numbness and headaches.    Allergies  Review of patient's allergies indicates no known allergies.  Home Medications   Current Outpatient Rx  Name  Route  Sig  Dispense  Refill  . etonogestrel (IMPLANON) 68 MG IMPL implant   Subcutaneous   Inject 1 each into the skin once.         . metroNIDAZOLE (FLAGYL) 250 MG tablet   Oral   Take 250 mg by mouth 3 (three) times daily.         . sertraline (ZOLOFT) 50 MG tablet   Oral   Take 50 mg by mouth daily.          BP 118/75  Pulse 62  Temp(Src) 98.1 F (36.7 C) (Oral)  Resp 18  SpO2 99%  LMP 08/13/2012 Physical Exam  Nursing note and vitals reviewed. Constitutional: She is oriented to person, place, and time. She appears well-developed and well-nourished. No distress.  uncomfortable  HENT:  Head: Normocephalic and atraumatic.  Eyes: Conjunctivae and EOM are normal.  Neck: Normal range of motion. Neck supple.  No meningeal signs  Cardiovascular: Normal rate, regular rhythm and normal heart sounds.  Exam reveals no gallop and no friction rub.   No murmur heard. Pulmonary/Chest: Effort normal  and breath sounds normal. No respiratory distress. She has no wheezes. She has no rales. She exhibits no tenderness.  Abdominal: Soft. Bowel sounds are normal. She exhibits no distension. There is tenderness in the epigastric area. There is no rebound, no guarding, no CVA tenderness and negative Murphy's sign.    Musculoskeletal: Normal range of motion. She exhibits no edema and no tenderness.  Neurological: She is alert and oriented to person, place, and time. No cranial  nerve deficit.  Skin: Skin is warm and dry. She is not diaphoretic. No erythema.  Psychiatric: She has a normal mood and affect.    ED Course  Procedures (including critical care time) Labs Reviewed  COMPREHENSIVE METABOLIC PANEL - Abnormal; Notable for the following:    Potassium 3.3 (*)    Glucose, Bld 123 (*)    AST 814 (*)    ALT 501 (*)    Total Bilirubin 1.4 (*)    All other components within normal limits  URINALYSIS, ROUTINE W REFLEX MICROSCOPIC - Abnormal; Notable for the following:    Color, Urine AMBER (*)    APPearance CLOUDY (*)    Bilirubin Urine SMALL (*)    Protein, ur 30 (*)    All other components within normal limits  URINE MICROSCOPIC-ADD ON - Abnormal; Notable for the following:    Squamous Epithelial / LPF FEW (*)    All other components within normal limits  CBC WITH DIFFERENTIAL  LIPASE, BLOOD  POCT PREGNANCY, URINE   US Abdomen Complete  09/02/2012   *RADIOLOGY REPORT*  Clinical Data:  Abdominal pain.  Elevated liver enzymes.  COMPLETE ABDOMINAL ULTRASOUND  Comparison:  CT scan of the abdomen dated 12/28/2008  Findings:  Gallbladder:  There are numerous stones in the gallbladder, the largest being 1.9 cm in diameter.  These stones were present on the prior CT scan.  Gallbladder wall is not thickened.  Negative sonographic Murphy's sign.  Common bile duct:  Common bile duct is dilated to a diameter of 10.2 mm.  There are dilated intrahepatic bile ducts.  Liver:  Intrahepatic ductal dilatation.  The liver parenchyma is otherwise normal.  IVC:  Normal.  Pancreas:  Normal.  Spleen:  Normal.  4.2 cm in length.  Right Kidney:  Normal.  10.0 cm in length.  Left Kidney:  Normal.  11.7 cm in length.  Abdominal aorta:  Normal.  2.2 cm maximum diameter.  IMPRESSION: Multiple gallstones.  Biliary ductal dilatation into the head of the pancreas.  The finding is most likely secondary to a distal common bile duct stone although the stone is not identified in the distal duct on  this exam.   Original Report Authenticated By: Francene Boyers, M.D.   1. Gall stones, common bile duct   2. Abdominal pain   3. Hypokalemia   4. Elevated transaminase level   5. Epigastric abdominal pain   6. Hypokalemia   7. Nausea     MDM  Patient is afebrile, nontoxic, nonseptic appearing, though uncomfortable appearing.  Abdominal exam was unimpressive. Patient does not appear to have a surgical abdomen and there are no peritoneal signs.  Low suspicion for appendicitis, bowel obstruction, bowel perforation, cholecystitis, diverticulitis, PID or ectopic pregnancy. Will get basic labs, urinalysis, give GI cocktail, and re-evaluate.   Significantly elevated AST/ALT with normal Alk Phos and slightly elevated total bili. Will order abdominal ultrasound to consider gall bladder/liver. Discussed with Dr. Preston Fleeting who agreed with plan and will ask pt if she is still on  Flagyl and how long she has been on Zoloft as these can interfere with liver functions.  Pt states she finished the flagyl 3 weeks ago and started the Zoloft 2 weeks ago.   Ultrasound findings show multiple gallstones which is something that was also noted in a CT scan from 2010. But also with some biliary ductal dilatation into the head of the pancreas and Intrahepatic ductal dilatation. Discussed with Dr. Preston Fleeting who agrees that with these findings, along with the significant pain pt is appreciating, pt should be admitted. Will consult [facility].   7:32 PM Consulted both internal medicine (Dr. Lovell Sheehan) and GI (Dr. Rhea Belton, [place]). GI will see pt in the morning for ERCP. Dr. Lovell Sheehan will admit to med surg.   Glade Nurse, PA-C 09/02/12 2022

## 2012-09-02 NOTE — ED Notes (Addendum)
Report attempted 

## 2012-09-02 NOTE — ED Notes (Signed)
Pt here from home with c/o abd pain that  Started  this morning around 7 am , pain is in  Epigastric area , no n/v

## 2012-09-02 NOTE — ED Provider Notes (Signed)
Medical screening examination/treatment/procedure(s) were performed by non-physician practitioner and as supervising physician I was immediately available for consultation/collaboration.   Dione Booze, MD 09/02/12 [phone]

## 2012-09-02 NOTE — H&P (Signed)
[facility] History and Physical  [name] ZHY:[id number] DOB: 1986/01/24 DOA: 09/02/2012  Referring physician:  EDP PCP: Pcp Not In System  Specialists:  [facility]: Dr. Rhea Belton  Chief Complaint: ABD Pain  HPI: [name] is a 27 y.o. female who was in her usual state of good health until this AM when she began to have severe 9/10 epigastric ABD Pain.  The pain was constant and sharp.   She had nausea but no vomiting.  She denies having fevers or chills or diarrhea.  In the ED she was evaluated and was found to have elevated transaminases and an ABD [place] study revealed +Gallstones and CBD dilatation.  She was referred for medical admission and Gi was consulted to evaluate and perform an ERCP if possible in the AM.  Dr. Rhea Belton of [facility] was consulted.      Review of Systems: The patient denies anorexia, fever, chills, headaches, weight loss, vision loss, diplopia, dizziness, decreased hearing, rhinitis, hoarseness, chest pain, syncope, dyspnea on exertion, peripheral edema, balance deficits, cough, hemoptysis, vomiting, diarrhea, constipation, hematemesis, melena, hematochezia, severe indigestion/heartburn, dysuria, hematuria, incontinence, muscle weakness, suspicious skin lesions, transient blindness, difficulty walking, depression, unusual weight change, abnormal bleeding, enlarged lymph nodes, angioedema, and breast masses.    History reviewed. No pertinent past medical history.   G3P3   History reviewed. No pertinent past surgical history.   Prior to Admission medications   Medication Sig Start Date End Date Taking? Authorizing Provider  etonogestrel (IMPLANON) 68 MG IMPL implant Inject 1 each into the skin once.   Yes Historical Provider, MD  metroNIDAZOLE (FLAGYL) 250 MG tablet Take 250 mg by mouth 3 (three) times daily. 08/15/12  Yes Historical Provider, MD  sertraline (ZOLOFT) 50 MG tablet Take 50 mg by mouth daily.   Yes Historical Provider, MD     No  Known Allergies   Social History:     She smokes 3 cigarettes a day, and she drinks maybe twice a month.    reports that she has been smoking.  She does not have any smokeless tobacco history on file. She reports that  drinks alcohol. She reports that she does not use illicit drugs.     Family History  Problem Relation Age of Onset  . CAD Maternal Grandmother   . Hypertension Mother   . Diabetes Mother   . Breast cancer Maternal Aunt      Physical Exam:  GEN:  Pleasant Well nourished and well developed  27 y.o. African American  female  examined  and in no acute distress; cooperative with exam Filed Vitals:   09/02/12 1514 09/02/12 1612 09/02/12 1810 09/02/12 2005  BP: 118/75 114/60 131/83   Pulse: 62 85 57 65  Temp:    98.8 F (37.1 C)  TempSrc:    Oral  Resp: 18 18 19 19   SpO2: 99% 99% 98% 97%   Blood pressure 131/83, pulse 65, temperature 98.8 F (37.1 C), temperature source Oral, resp. rate 19, last menstrual period 08/13/2012, SpO2 97.00%. PSYCH: She is alert and oriented x4; does not appear anxious does not appear depressed; affect is normal HEENT: Normocephalic and Atraumatic, Mucous membranes pink; PERRLA; EOM intact; Fundi:  Benign;  No scleral icterus, Nares: Patent, Oropharynx: Clear, Fair Dentition, Neck:  FROM, no cervical lymphadenopathy nor thyromegaly or carotid bruit; no JVD; Breasts:: Not examined CHEST WALL: No tenderness CHEST: Normal respiration, clear to auscultation bilaterally HEART: Regular rate and rhythm; no murmurs rubs or gallops BACK:  No kyphosis or scoliosis; no CVA tenderness ABDOMEN: Positive Bowel Sounds, Obese, soft non-tender; no masses, no organomegaly.   Rectal Exam: Not done EXTREMITIES: No cyanosis, clubbing or edema; no ulcerations. Genitalia: not examined PULSES: 2+ and symmetric SKIN: Normal hydration no rash or ulceration CNS: Cranial nerves 2-12 grossly intact no focal neurologic deficit    Labs on Admission:  Basic  Metabolic Panel:  Recent Labs Lab 09/02/12 1504  NA 138  K 3.3*  CL 101  CO2 25  GLUCOSE 123*  BUN 9  CREATININE 0.55  CALCIUM 9.0   Liver Function Tests:  Recent Labs Lab 09/02/12 1504  AST 814*  ALT 501*  ALKPHOS 102  BILITOT 1.4*  PROT 7.0  ALBUMIN 3.8    Recent Labs Lab 09/02/12 1504  LIPASE 40   No results found for this basename: AMMONIA,  in the last 168 hours CBC:  Recent Labs Lab 09/02/12 1504  WBC 9.0  NEUTROABS 6.6  HGB 13.5  HCT 39.5  MCV 92.3  PLT 248   Cardiac Enzymes: No results found for this basename: CKTOTAL, CKMB, CKMBINDEX, TROPONINI,  in the last 168 hours  BNP (last 3 results) No results found for this basename: PROBNP,  in the last 8760 hours CBG: No results found for this basename: GLUCAP,  in the last 168 hours  Radiological Exams on Admission: US Abdomen Complete  09/02/2012   *RADIOLOGY REPORT*  Clinical Data:  Abdominal pain.  Elevated liver enzymes.  COMPLETE ABDOMINAL ULTRASOUND  Comparison:  CT scan of the abdomen dated 12/28/2008  Findings:  Gallbladder:  There are numerous stones in the gallbladder, the largest being 1.9 cm in diameter.  These stones were present on the prior CT scan.  Gallbladder wall is not thickened.  Negative sonographic Murphy's sign.  Common bile duct:  Common bile duct is dilated to a diameter of 10.2 mm.  There are dilated intrahepatic bile ducts.  Liver:  Intrahepatic ductal dilatation.  The liver parenchyma is otherwise normal.  IVC:  Normal.  Pancreas:  Normal.  Spleen:  Normal.  4.2 cm in length.  Right Kidney:  Normal.  10.0 cm in length.  Left Kidney:  Normal.  11.7 cm in length.  Abdominal aorta:  Normal.  2.2 cm maximum diameter.  IMPRESSION: Multiple gallstones.  Biliary ductal dilatation into the head of the pancreas.  The finding is most likely secondary to a distal common bile duct stone although the stone is not identified in the distal duct on this exam.   Original Report Authenticated By:  Francene Boyers, M.D.     Assessment/Plan Principal Problem:   Cholelithiasis with obstruction Active Problems:   Elevated transaminase level   Epigastric abdominal pain   Nausea   Hypokalemia    1.  Cholelithiasis with Obstruction- Seen on ABD [place].  GI to see in Am 07/06 for ERCP.  Cler liquids now, and NPO after midnight .  Pain Control with IV Dilaudid, IV Zofran PRN Nausea, and IVFs for maintenance.    2.  Elevated Transaminases- Due to #1, Monitor Trend, should improve after relief of Obstruction .     3.  Epigastric ABD Pain- Due to #1.  Pain Control with IV dilaudid PRN.    4.  Nausea- Due to #1, and Anti-Emetics PRN.    5.  Hypokalemia-  Replete K+, and Monitor.    6.  SCDs for DVT prophylaxis.       Code Status:      FULL CODE  Family Communication:    No Family Present Disposition Plan:    Return to Home  Time spent:  63 Minutes  Ron Parker [facility] Pager [phone]  If 7PM-7AM, please contact night-coverage [email or web address] Password [facility] 09/02/2012, 8:13 PM

## 2012-09-03 ENCOUNTER — Encounter (HOSPITAL_COMMUNITY): Payer: Self-pay | Admitting: Internal Medicine

## 2012-09-03 DIAGNOSIS — K8020 Calculus of gallbladder without cholecystitis without obstruction: Secondary | ICD-10-CM

## 2012-09-03 DIAGNOSIS — R109 Unspecified abdominal pain: Secondary | ICD-10-CM

## 2012-09-03 DIAGNOSIS — K831 Obstruction of bile duct: Secondary | ICD-10-CM

## 2012-09-03 LAB — BASIC METABOLIC PANEL
BUN: 4 mg/dL — ABNORMAL LOW (ref 6–23)
Creatinine, Ser: 0.56 mg/dL (ref 0.50–1.10)
GFR calc Af Amer: >90 mL/min (ref >90)
GFR calc non Af Amer: >90 mL/min (ref >90)

## 2012-09-03 LAB — CBC
MCHC: 32.7 g/dL (ref 30.0–36.0)
RDW: 13.5 % (ref 11.5–15.5)

## 2012-09-03 MED ORDER — CHLORHEXIDINE GLUCONATE 0.12 % MT SOLN
15.0000 mL | Freq: Two times a day (BID) | OROMUCOSAL | Status: DC
  Administered 2012-09-03 – 2012-09-04 (×3): 15 mL via OROMUCOSAL
  Filled 2012-09-03 (×2): qty 15

## 2012-09-03 MED ORDER — WHITE PETROLATUM GEL
Status: AC
  Administered 2012-09-03: 12:00:00
  Filled 2012-09-03: qty 5

## 2012-09-03 MED ORDER — BIOTENE DRY MOUTH MT LIQD
15.0000 mL | Freq: Two times a day (BID) | OROMUCOSAL | Status: DC
  Administered 2012-09-03 – 2012-09-04 (×3): 15 mL via OROMUCOSAL

## 2012-09-03 MED ORDER — SODIUM CHLORIDE 0.9 % IV SOLN
INTRAVENOUS | Status: AC
  Administered 2012-09-03: 18:00:00 via INTRAVENOUS

## 2012-09-03 MED ORDER — PIPERACILLIN-TAZOBACTAM 3.375 G IVPB
3.3750 g | Freq: Three times a day (TID) | INTRAVENOUS | Status: DC
  Administered 2012-09-03 – 2012-09-05 (×7): 3.375 g via INTRAVENOUS
  Filled 2012-09-03 (×8): qty 50

## 2012-09-03 NOTE — Consult Note (Signed)
Reason for Consult: Right upper quadrant abdominal pain, cholelithiasis with CBD obstruction Referring Physician: Leroy Sea, MD  [name] is an 27 y.o. female.  HPI: 27 yr old female who presented to [facility] yesterday morning with 9/10 sharp and severe epigastric and RUQ abdominal pain that was associated with nausea.  She denies vomiting, fevers, chills, or diarrhea.  She has not had symptoms like this before.  Evaluation in the ER showed a dilated CBD and cholelithiasis and elevated transminases.  GI was consulted for possible ERCP.  We are consulted to evaluate for laparoscopic cholecystectomy.    History reviewed. No pertinent past medical history.  History reviewed. No pertinent past surgical history.  Family History  Problem Relation Age of Onset  . CAD Maternal Grandmother   . Hypertension Mother   . Diabetes Mother   . Breast cancer Maternal Aunt     Social History:  reports that she has been smoking.  She does not have any smokeless tobacco history on file. She reports that  drinks alcohol. She reports that she does not use illicit drugs.  Allergies: No Known Allergies  Medications: I have reviewed the patient's current medications.  Results for orders placed during the hospital encounter of 09/02/12 (from the past 48 hour(s))  CBC WITH DIFFERENTIAL     Status: None   Collection Time    09/02/12  3:04 PM      Result Value Range   WBC 9.0  4.0 - 10.5 K/uL   RBC 4.28  3.87 - 5.11 MIL/uL   Hemoglobin 13.5  12.0 - 15.0 g/dL   HCT 40.9  81.1 - 91.4 %   MCV 92.3  78.0 - 100.0 fL   MCH 31.5  26.0 - 34.0 pg   MCHC 34.2  30.0 - 36.0 g/dL   RDW 78.2  95.6 - 21.3 %   Platelets 248  150 - 400 K/uL   Neutrophils Relative % 74  43 - 77 %   Neutro Abs 6.6  1.7 - 7.7 K/uL   Lymphocytes Relative 18  12 - 46 %   Lymphs Abs 1.6  0.7 - 4.0 K/uL   Monocytes Relative 7  3 - 12 %   Monocytes Absolute 0.7  0.1 - 1.0 K/uL   Eosinophils Relative 1  0 - 5 %   Eosinophils  Absolute 0.1  0.0 - 0.7 K/uL   Basophils Relative 0  0 - 1 %   Basophils Absolute 0.0  0.0 - 0.1 K/uL  COMPREHENSIVE METABOLIC PANEL     Status: Abnormal   Collection Time    09/02/12  3:04 PM      Result Value Range   Sodium 138  135 - 145 mEq/L   Potassium 3.3 (*) 3.5 - 5.1 mEq/L   Chloride 101  96 - 112 mEq/L   CO2 25  19 - 32 mEq/L   Glucose, Bld 123 (*) 70 - 99 mg/dL   BUN 9  6 - 23 mg/dL   Creatinine, Ser 0.86  0.50 - 1.10 mg/dL   Calcium 9.0  8.4 - 57.8 mg/dL   Total Protein 7.0  6.0 - 8.3 g/dL   Albumin 3.8  3.5 - 5.2 g/dL   AST 469 (*) 0 - 37 U/L   ALT 501 (*) 0 - 35 U/L   Alkaline Phosphatase 102  39 - 117 U/L   Total Bilirubin 1.4 (*) 0.3 - 1.2 mg/dL   GFR calc non Af Amer >90  >  90 mL/min   GFR calc Af Amer >90  >90 mL/min   Comment:            The eGFR has been calculated     using the CKD EPI equation.     This calculation has not been     validated in all clinical     situations.     eGFR's persistently     <90 mL/min signify     possible Chronic Kidney Disease.  LIPASE, BLOOD     Status: None   Collection Time    09/02/12  3:04 PM      Result Value Range   Lipase 40  11 - 59 U/L  URINALYSIS, ROUTINE W REFLEX MICROSCOPIC     Status: Abnormal   Collection Time    09/02/12  3:07 PM      Result Value Range   Color, Urine AMBER (*) YELLOW   Comment: BIOCHEMICALS MAY BE AFFECTED BY COLOR   APPearance CLOUDY (*) CLEAR   Specific Gravity, Urine 1.026  1.005 - 1.030   pH 8.0  5.0 - 8.0   Glucose, UA NEGATIVE  NEGATIVE mg/dL   Hgb urine dipstick NEGATIVE  NEGATIVE   Bilirubin Urine SMALL (*) NEGATIVE   Ketones, ur NEGATIVE  NEGATIVE mg/dL   Protein, ur 30 (*) NEGATIVE mg/dL   Urobilinogen, UA 1.0  0.0 - 1.0 mg/dL   Nitrite NEGATIVE  NEGATIVE   Leukocytes, UA NEGATIVE  NEGATIVE  URINE MICROSCOPIC-ADD ON     Status: Abnormal   Collection Time    09/02/12  3:07 PM      Result Value Range   Squamous Epithelial / LPF FEW (*) RARE   Urine-Other AMORPHOUS  URATES/PHOSPHATES    POCT PREGNANCY, URINE     Status: None   Collection Time    09/02/12  3:33 PM      Result Value Range   Preg Test, Ur NEGATIVE  NEGATIVE   Comment:            THE SENSITIVITY OF THIS     METHODOLOGY IS >24 mIU/mL  BASIC METABOLIC PANEL     Status: Abnormal   Collection Time    09/03/12  4:05 AM      Result Value Range   Sodium 134 (*) 135 - 145 mEq/L   Potassium 3.9  3.5 - 5.1 mEq/L   Chloride 104  96 - 112 mEq/L   CO2 27  19 - 32 mEq/L   Glucose, Bld 90  70 - 99 mg/dL   BUN 4 (*) 6 - 23 mg/dL   Creatinine, Ser 1.61  0.50 - 1.10 mg/dL   Calcium 7.9 (*) 8.4 - 10.5 mg/dL   GFR calc non Af Amer >90  >90 mL/min   GFR calc Af Amer >90  >90 mL/min   Comment:            The eGFR has been calculated     using the CKD EPI equation.     This calculation has not been     validated in all clinical     situations.     eGFR's persistently     <90 mL/min signify     possible Chronic Kidney Disease.  CBC     Status: Abnormal   Collection Time    09/03/12  4:05 AM      Result Value Range   WBC 7.5  4.0 - 10.5 K/uL   RBC 3.79 (*) 3.87 -  5.11 MIL/uL   Hemoglobin 11.7 (*) 12.0 - 15.0 g/dL   HCT 16.1 (*) 09.6 - 04.5 %   MCV 94.5  78.0 - 100.0 fL   MCH 30.9  26.0 - 34.0 pg   MCHC 32.7  30.0 - 36.0 g/dL   RDW 40.9  81.1 - 91.4 %   Platelets 237  150 - 400 K/uL    US Abdomen Complete  09/02/2012   *RADIOLOGY REPORT*  Clinical Data:  Abdominal pain.  Elevated liver enzymes.  COMPLETE ABDOMINAL ULTRASOUND  Comparison:  CT scan of the abdomen dated 12/28/2008  Findings:  Gallbladder:  There are numerous stones in the gallbladder, the largest being 1.9 cm in diameter.  These stones were present on the prior CT scan.  Gallbladder wall is not thickened.  Negative sonographic Murphy's sign.  Common bile duct:  Common bile duct is dilated to a diameter of 10.2 mm.  There are dilated intrahepatic bile ducts.  Liver:  Intrahepatic ductal dilatation.  The liver parenchyma is  otherwise normal.  IVC:  Normal.  Pancreas:  Normal.  Spleen:  Normal.  4.2 cm in length.  Right Kidney:  Normal.  10.0 cm in length.  Left Kidney:  Normal.  11.7 cm in length.  Abdominal aorta:  Normal.  2.2 cm maximum diameter.  IMPRESSION: Multiple gallstones.  Biliary ductal dilatation into the head of the pancreas.  The finding is most likely secondary to a distal common bile duct stone although the stone is not identified in the distal duct on this exam.   Original Report Authenticated By: Francene Boyers, M.D.    Review of Systems  Constitutional: Negative.   HENT: Negative.   Eyes: Negative.   Respiratory: Negative.   Cardiovascular: Negative.   Gastrointestinal: Positive for nausea and abdominal pain. Negative for vomiting, diarrhea and constipation.  Genitourinary: Negative.   Musculoskeletal: Negative.   Skin: Negative.   Neurological: Negative.   Endo/Heme/Allergies: Negative.   Psychiatric/Behavioral: Positive for depression. Negative for substance abuse.   Blood pressure 121/73, pulse 55, temperature 97.4 F (36.3 C), temperature source Oral, resp. rate 18, height 5\' 4"  (1.626 m), weight 175 lb 11.3 oz (79.7 kg), last menstrual period 08/13/2012, SpO2 99.00%. Physical Exam  Assessment/Plan: Cholelithiasis with CBD obstruction: patient is currently being seen by GI.  They report that ERCP would not be possible until tomorrow and are planning to proceed with MRCP today.  Pending results of MRCP, would can proceed with laparoscopic cholecystectomy with IOC, ?ERCP after if IOC positive.  Will plan to make patient NPO after MN tonight if case surgery can proceed tomorrow.  Tesla Keeler 09/03/2012, 11:31 AM

## 2012-09-03 NOTE — Consult Note (Signed)
Patient needs preoperative ERCP.  Not sure of the value of MRCP.  No surgery until after ERCP.  [name]. Gae Bon, MD, FACS [phone] [facility]

## 2012-09-03 NOTE — Progress Notes (Signed)
[facility] Consultation  Referring Provider: Dr. Wyatt, CCS Primary Care Physician:  Pcp Not In System Primary Gastroenterologist:  none  Reason for Consultation:  CBD stones/obstruction  HPI: [name] is a 26 y.o. female with little past medical history who developed severe epigastric abdominal pain yesterday morning. The pain was sharp in nature without specific radiation. It was associated with nausea but no vomiting. She denied fevers or chills. No change in her bowel habits including no blood in her stool or melena. She denies similar pain in the past.  She denies chest pain, dyspnea.  No trouble swallowing.  No severe heartburn.   History reviewed. No pertinent past medical history.  History reviewed. No pertinent past surgical history.  Prior to Admission medications   Medication Sig Start Date End Date Taking? Authorizing Provider  etonogestrel (IMPLANON) 68 MG IMPL implant Inject 1 each into the skin once.   Yes Historical Provider, MD  metroNIDAZOLE (FLAGYL) 250 MG tablet Take 250 mg by mouth 3 (three) times daily. 08/15/12  Yes Historical Provider, MD  sertraline (ZOLOFT) 50 MG tablet Take 50 mg by mouth daily.   Yes Historical Provider, MD    Current Facility-Administered Medications  Medication Dose Route Frequency Provider Last Rate Last Dose  . 0.9 %  sodium chloride infusion   Intravenous Continuous Prashant K Singh, MD 75 mL/hr at 09/03/12 1045    . acetaminophen (TYLENOL) tablet 650 mg  650 mg Oral Q6H PRN Harvette C Jenkins, MD       Or  . acetaminophen (TYLENOL) suppository 650 mg  650 mg Rectal Q6H PRN Harvette C Jenkins, MD      . alum & mag hydroxide-simeth (MAALOX/MYLANTA) 200-200-20 MG/5ML suspension 30 mL  30 mL Oral Q6H PRN Harvette C Jenkins, MD      . antiseptic oral rinse (BIOTENE) solution 15 mL  15 mL Mouth Rinse q12n4p Harvette C Jenkins, MD   15 mL at 09/03/12 1200  . chlorhexidine (PERIDEX) 0.12 % solution 15 mL  15 mL Mouth  Rinse BID Harvette C Jenkins, MD   15 mL at 09/03/12 0859  . HYDROmorphone (DILAUDID) injection 0.5-1 mg  0.5-1 mg Intravenous Q3H PRN Harvette C Jenkins, MD   1 mg at 09/03/12 1324  . ondansetron (ZOFRAN) tablet 4 mg  4 mg Oral Q6H PRN Harvette C Jenkins, MD       Or  . ondansetron (ZOFRAN) injection 4 mg  4 mg Intravenous Q6H PRN Harvette C Jenkins, MD      . oxyCODONE (Oxy IR/ROXICODONE) immediate release tablet 5 mg  5 mg Oral Q4H PRN Harvette C Jenkins, MD      . pantoprazole (PROTONIX) injection 40 mg  40 mg Intravenous Q12H Harvette C Jenkins, MD   40 mg at 09/03/12 0958  . piperacillin-tazobactam (ZOSYN) IVPB 3.375 g  3.375 g Intravenous Q8H Michelle Saunders Turner, RPH   3.375 g at 09/03/12 1156  . sertraline (ZOLOFT) tablet 50 mg  50 mg Oral Daily Harvette C Jenkins, MD   50 mg at 09/03/12 0958  . zolpidem (AMBIEN) tablet 5 mg  5 mg Oral QHS PRN Harvette C Jenkins, MD        Allergies as of 09/02/2012  . (No Known Allergies)    Family History  Problem Relation Age of Onset  . CAD Maternal Grandmother   . Hypertension Mother   . Diabetes Mother   . Breast cancer Maternal Aunt     History   Social History  .   Marital Status: Single    Spouse Name: N/A    Number of Children: N/A  . Years of Education: N/A   Occupational History  . Not on file.   Social History Main Topics  . Smoking status: Current Every Day Smoker  . Smokeless tobacco: Not on file  . Alcohol Use: Yes  . Drug Use: No  . Sexually Active: Not on file   Other Topics Concern  . Not on file   Social History Narrative  . No narrative on file    Review of Systems: As per history of present illness, otherwise negative  Physical Exam: Vital signs in last 24 hours: Temp:  [97.4 F (36.3 C)-98.8 F (37.1 C)] 97.4 F (36.3 C) (07/06 1008) Pulse Rate:  [55-85] 55 (07/06 1008) Resp:  [18-19] 18 (07/06 1008) BP: (104-131)/(58-85) 121/73 mmHg (07/06 1008) SpO2:  [97 %-100 %] 99 % (07/06  1008) Weight:  [175 lb 11.3 oz (79.7 kg)] 175 lb 11.3 oz (79.7 kg) (07/05 2124) Last BM Date: 09/02/12 Gen: awake, alert, NAD HEENT: anicteric, op clear CV: RRR, no mrg Pulm: CTA b/l Abd: soft, mild to moderate epigastric tenderness without rebound or guarding, nondistended, +BS throughout Ext: no c/c/e Neuro: nonfocal   Intake/Output from previous day: 07/05 0701 - 07/06 0700 In: -  Out: 1 [Urine:1] Intake/Output this shift: Total I/O In: -  Out: 1300 [Urine:1300]  Lab Results:  Recent Labs  09/02/12 1504 09/03/12 0405  WBC 9.0 7.5  HGB 13.5 11.7*  HCT 39.5 35.8*  PLT 248 237   BMET  Recent Labs  09/02/12 1504 09/03/12 0405  NA 138 134*  K 3.3* 3.9  CL 101 104  CO2 25 27  GLUCOSE 123* 90  BUN 9 4*  CREATININE 0.55 0.56  CALCIUM 9.0 7.9*   LFT  Recent Labs  09/02/12 1504  PROT 7.0  ALBUMIN 3.8  AST 814*  ALT 501*  ALKPHOS 102  BILITOT 1.4*    Studies/Results: Us Abdomen Complete  09/02/2012   *RADIOLOGY REPORT*  Clinical Data:  Abdominal pain.  Elevated liver enzymes.  COMPLETE ABDOMINAL ULTRASOUND  Comparison:  CT scan of the abdomen dated 12/28/2008  Findings:  Gallbladder:  There are numerous stones in the gallbladder, the largest being 1.9 cm in diameter.  These stones were present on the prior CT scan.  Gallbladder wall is not thickened.  Negative sonographic Murphy's sign.  Common bile duct:  Common bile duct is dilated to a diameter of 10.2 mm.  There are dilated intrahepatic bile ducts.  Liver:  Intrahepatic ductal dilatation.  The liver parenchyma is otherwise normal.  IVC:  Normal.  Pancreas:  Normal.  Spleen:  Normal.  4.2 cm in length.  Right Kidney:  Normal.  10.0 cm in length.  Left Kidney:  Normal.  11.7 cm in length.  Abdominal aorta:  Normal.  2.2 cm maximum diameter.  IMPRESSION: Multiple gallstones.  Biliary ductal dilatation into the head of the pancreas.  The finding is most likely secondary to a distal common bile duct stone although  the stone is not identified in the distal duct on this exam.   Original Report Authenticated By: James Maxwell, M.D.     Previous Endoscopies: none  Impression/ Recommendations: 26-year-old female admitted with epigastric abdominal pain found by ultrasound to have multiple gallstones, intra-and extra hepatic biliary ductal dilatation consistent with common bile duct stone/obstruction.  1.  Choledocholithiasis --  patient has symptomatic choledocholithiasis and elevated LFTs consistent with the same.    I entertained MRCP, with the thoughts of a negative study may allow for cholecystectomy tomorrow.  Dr. Wyatt feels that ERCP is indicated first, and I do not disagree.  She has symptoms, LFT pattern, and imaging consistent with CBD stone(s).  I discussed ERCP with her at length including the risks and benefits and she is agreeable to proceed. We discussed the risk of pancreatitis, bleeding, infection, sedation-related risks.  Time was provided for questions and answers and we reviewed the hepatobiliary anatomy together so that she would understand the planned procedure. --clear liquid diet now --NPO after MN --ERCP tomorrow with Dr. Jacobs in OR (awaiting time per OR staff) --pain control, IVFs  2.  Gallstones -- Gen. surgery plans cholecystectomy after ERCP, likely Tuesday   LOS: 1 day   Elzia Hott M  09/03/2012, 1:54 PM     

## 2012-09-03 NOTE — Progress Notes (Signed)
ANTIBIOTIC CONSULT NOTE - INITIAL  Pharmacy Consult for Zosyn Indication: Cholelithiasis  No Known Allergies  Patient Measurements: Height: 5\' 4"  (162.6 cm) Weight: 175 lb 11.3 oz (79.7 kg) IBW/kg (Calculated) : 54.7  Vital Signs: Temp: 98 F (36.7 C) (07/06 0626) BP: 109/61 mmHg (07/06 0626) Pulse Rate: 59 (07/06 0626) Intake/Output from previous day: 07/05 0701 - 07/06 0700 In: -  Out: 1 [Urine:1] Intake/Output from this shift:    Labs:  Recent Labs  09/02/12 1504 09/03/12 0405  WBC 9.0 7.5  HGB 13.5 11.7*  PLT 248 237  CREATININE 0.55 0.56   Estimated Creatinine Clearance: 107.9 ml/min (by C-G formula based on Cr of 0.56). No results found for this basename: VANCOTROUGH, VANCOPEAK, VANCORANDOM, GENTTROUGH, GENTPEAK, GENTRANDOM, TOBRATROUGH, TOBRAPEAK, TOBRARND, AMIKACINPEAK, AMIKACINTROU, AMIKACIN,  in the last 72 hours   Microbiology: No results found for this or any previous visit (from the past 720 hour(s)).  Medical History: History reviewed. No pertinent past medical history.  Medications:  Anti-infectives   None     Assessment: 27 year old female admitted with cholelithiasis with obstruction to begin empiric antibiotic therapy with IV Zosyn.  Plan:  Zosyn 3.375gm IV q8h extended infusion As no dosage adjustments are anticipated pharmacy will sign off.  Thank you for the consult.  Estella Husk, Pharm.D., BCPS, AAHIVP Clinical Pharmacist Phone: [phone] or [phone] 09/03/2012, 8:44 AM

## 2012-09-03 NOTE — Progress Notes (Addendum)
[facility]                                                                                Patient Demographics  [name], is a 27 y.o. female, DOB - [birth year], BJY:[id number], YQM:[id number]  Admit date - 09/02/2012  Admitting Physician Ron Parker, MD  Outpatient Primary MD for the patient is Pcp Not In System  LOS - 1   Chief Complaint  Patient presents with  . Abdominal Pain        Assessment & Plan    1. Right upper quadrant abdominal pain with nausea vomiting secondary to   Cholelithiasis with obstruction, elevated transaminases. Patient is n.p.o., IV fluids, empiric IV Zosyn for now, due for ERCP later today, have also consulted Gen. surgery for eventual cholecystectomy likely on 09/04/2012, patient clinically looks stable right now the   2. Nausea vomiting causing hyperkalemia due to #1 above. Stable now, supportive care, potassium repleted and stable.   3. H/O depression home dose Zoloft to continue once taking by mouth.    Code Status: Full  Family Communication: None present  Disposition Plan: Home   Procedures right upper quadrant ultrasound, scheduled for ERCP on 09/03/2012, likely laparoscopy cholecystectomy on 09/04/2012   Consults  GI and general surgery   DVT Prophylaxis    SCDs    Lab Results  Component Value Date   PLT 237 09/03/2012    Medications  Scheduled Meds: . antiseptic oral rinse  15 mL Mouth Rinse q12n4p  . chlorhexidine  15 mL Mouth Rinse BID  . pantoprazole (PROTONIX) IV  40 mg Intravenous Q12H  . potassium chloride SA      . sertraline  50 mg Oral Daily   Continuous Infusions: . sodium chloride 125 mL/hr at 09/03/12 0643   PRN Meds:.acetaminophen, acetaminophen, alum & mag hydroxide-simeth, HYDROmorphone (DILAUDID) injection, ondansetron (ZOFRAN) IV, ondansetron, oxyCODONE, zolpidem  Antibiotics     Anti-infectives   None       Time Spent in minutes   35   SINGH,PRASHANT K M.D on 09/03/2012  at 7:56 AM  Between 7am to 7pm - Pager - [phone]  After 7pm go to [email or web address] - password [email or web address]  And look for the night coverage person covering for me after hours  [facility] Office  [phone]    Subjective:   [name] today has, No headache, No chest pain, No abdominal pain - No Nausea, No new weakness tingling or numbness, No Cough - SOB.    Objective:   Filed Vitals:   09/02/12 2005 09/02/12 2124 09/03/12 0215 09/03/12 0626  BP:  124/85 104/58 109/61  Pulse: 65 61 58 59  Temp: 98.8 F (37.1 C) 98.1 F (36.7 C) 97.5 F (36.4 C) 98 F (36.7 C)  TempSrc: Oral     Resp: 19 18 18 18   Height:  5\' 4"  (1.626 m)    Weight:  79.7 kg (175 lb 11.3 oz)    SpO2: 97% 100% 98% 97%    Wt Readings from Last 3 Encounters:  09/02/12 79.7 kg (175 lb 11.3 oz)     Intake/Output Summary (Last 24 hours) at 09/03/12 0756 Last data filed at  09/03/12 0500  Gross per 24 hour  Intake      0 ml  Output      1 ml  Net     -1 ml    Exam Awake Alert, Oriented X 3, No new F.N deficits, Normal affect [place].AT,PERRAL Supple Neck,No JVD, No cervical lymphadenopathy appriciated.  Symmetrical Chest wall movement, Good air movement bilaterally, CTAB RRR,No Gallops,Rubs or new Murmurs, No Parasternal Heave +ve B.Sounds, Abd Soft, Non tender, No organomegaly appriciated, No rebound - guarding or rigidity. No Cyanosis, Clubbing or edema, No new Rash or bruise     Data Review   Micro Results No results found for this or any previous visit (from the past 240 hour(s)).  Radiology Reports US Abdomen Complete  09/02/2012   *RADIOLOGY REPORT*  Clinical Data:  Abdominal pain.  Elevated liver enzymes.  COMPLETE ABDOMINAL ULTRASOUND  Comparison:  CT scan of the abdomen dated 12/28/2008  Findings:  Gallbladder:  There are numerous stones in the gallbladder, the largest being 1.9 cm in diameter.  These stones were present on the prior CT scan.  Gallbladder wall is not thickened.   Negative sonographic Murphy's sign.  Common bile duct:  Common bile duct is dilated to a diameter of 10.2 mm.  There are dilated intrahepatic bile ducts.  Liver:  Intrahepatic ductal dilatation.  The liver parenchyma is otherwise normal.  IVC:  Normal.  Pancreas:  Normal.  Spleen:  Normal.  4.2 cm in length.  Right Kidney:  Normal.  10.0 cm in length.  Left Kidney:  Normal.  11.7 cm in length.  Abdominal aorta:  Normal.  2.2 cm maximum diameter.  IMPRESSION: Multiple gallstones.  Biliary ductal dilatation into the head of the pancreas.  The finding is most likely secondary to a distal common bile duct stone although the stone is not identified in the distal duct on this exam.   Original Report Authenticated By: Francene Boyers, M.D.    CBC  Recent Labs Lab 09/02/12 1504 09/03/12 0405  WBC 9.0 7.5  HGB 13.5 11.7*  HCT 39.5 35.8*  PLT 248 237  MCV 92.3 94.5  MCH 31.5 30.9  MCHC 34.2 32.7  RDW 13.3 13.5  LYMPHSABS 1.6  --   MONOABS 0.7  --   EOSABS 0.1  --   BASOSABS 0.0  --     Chemistries   Recent Labs Lab 09/02/12 1504 09/03/12 0405  NA 138 134*  K 3.3* 3.9  CL 101 104  CO2 25 27  GLUCOSE 123* 90  BUN 9 4*  CREATININE 0.55 0.56  CALCIUM 9.0 7.9*  AST 814*  --   ALT 501*  --   ALKPHOS 102  --   BILITOT 1.4*  --    ------------------------------------------------------------------------------------------------------------------ estimated creatinine clearance is 107.9 ml/min (by C-G formula based on Cr of 0.56). ------------------------------------------------------------------------------------------------------------------ No results found for this basename: HGBA1C,  in the last 72 hours ------------------------------------------------------------------------------------------------------------------ No results found for this basename: CHOL, HDL, LDLCALC, TRIG, CHOLHDL, LDLDIRECT,  in the last 72  hours ------------------------------------------------------------------------------------------------------------------ No results found for this basename: TSH, T4TOTAL, FREET3, T3FREE, THYROIDAB,  in the last 72 hours ------------------------------------------------------------------------------------------------------------------ No results found for this basename: VITAMINB12, FOLATE, FERRITIN, TIBC, IRON, RETICCTPCT,  in the last 72 hours  Coagulation profile No results found for this basename: INR, PROTIME,  in the last 168 hours  No results found for this basename: DDIMER,  in the last 72 hours  Cardiac Enzymes No results found for this basename: CK, CKMB,  TROPONINI, MYOGLOBIN,  in the last 168 hours ------------------------------------------------------------------------------------------------------------------ No components found with this basename: POCBNP,

## 2012-09-04 ENCOUNTER — Encounter (HOSPITAL_COMMUNITY): Payer: Self-pay | Admitting: Anesthesiology

## 2012-09-04 ENCOUNTER — Inpatient Hospital Stay (HOSPITAL_COMMUNITY): Payer: Medicaid Other | Admitting: Anesthesiology

## 2012-09-04 ENCOUNTER — Inpatient Hospital Stay (HOSPITAL_COMMUNITY): Payer: Medicaid Other

## 2012-09-04 ENCOUNTER — Encounter (HOSPITAL_COMMUNITY)
Admission: EM | Disposition: A | Discharge status: Home / Self Care | Payer: Self-pay | Source: Home / Self Care | Attending: Internal Medicine

## 2012-09-04 DIAGNOSIS — R932 Abnormal findings on diagnostic imaging of liver and biliary tract: Secondary | ICD-10-CM

## 2012-09-04 DIAGNOSIS — R7402 Elevation of levels of lactic acid dehydrogenase (LDH): Secondary | ICD-10-CM

## 2012-09-04 DIAGNOSIS — R740 Nonspecific elevation of levels of transaminase and lactic acid dehydrogenase [LDH]: Secondary | ICD-10-CM

## 2012-09-04 HISTORY — PX: ERCP: SHX5425

## 2012-09-04 LAB — CBC
HCT: 34.9 % — ABNORMAL LOW (ref 36.0–46.0)
MCH: 30.9 pg (ref 26.0–34.0)
MCV: 93.1 fL (ref 78.0–100.0)
Platelets: 217 10*3/uL (ref 150–400)
RDW: 13.2 % (ref 11.5–15.5)

## 2012-09-04 LAB — HEPATIC FUNCTION PANEL
ALT: 398 U/L — ABNORMAL HIGH (ref 0–35)
AST: 131 U/L — ABNORMAL HIGH (ref 0–37)
Albumin: 3.2 g/dL — ABNORMAL LOW (ref 3.5–5.2)
Alkaline Phosphatase: 133 U/L — ABNORMAL HIGH (ref 39–117)
Total Bilirubin: 2.6 mg/dL — ABNORMAL HIGH (ref 0.3–1.2)

## 2012-09-04 LAB — BASIC METABOLIC PANEL
BUN: 3 mg/dL — ABNORMAL LOW (ref 6–23)
Calcium: 8.4 mg/dL (ref 8.4–10.5)
Creatinine, Ser: 0.64 mg/dL (ref 0.50–1.10)
GFR calc Af Amer: >90 mL/min (ref >90)

## 2012-09-04 SURGERY — ERCP, WITH INTERVENTION IF INDICATED
Anesthesia: General

## 2012-09-04 MED ORDER — LACTATED RINGERS IV SOLN
Freq: Once | INTRAVENOUS | Status: AC
  Administered 2012-09-04: 11:00:00 via INTRAVENOUS

## 2012-09-04 MED ORDER — SODIUM CHLORIDE 0.9 % IV SOLN: INTRAVENOUS | Status: DC

## 2012-09-04 MED ORDER — MIDAZOLAM HCL 5 MG/5ML IJ SOLN
INTRAMUSCULAR | Status: DC | PRN
  Administered 2012-09-04: 2 mg via INTRAVENOUS

## 2012-09-04 MED ORDER — LACTATED RINGERS IV SOLN
INTRAVENOUS | Status: DC | PRN
  Administered 2012-09-04: 11:00:00 via INTRAVENOUS

## 2012-09-04 MED ORDER — MENTHOL 3 MG MT LOZG
1.0000 | LOZENGE | OROMUCOSAL | Status: DC | PRN
  Filled 2012-09-04: qty 9

## 2012-09-04 MED ORDER — PROPOFOL 10 MG/ML IV BOLUS
INTRAVENOUS | Status: DC | PRN
  Administered 2012-09-04: 200 mg via INTRAVENOUS

## 2012-09-04 MED ORDER — GLYCOPYRROLATE 0.2 MG/ML IJ SOLN
INTRAMUSCULAR | Status: DC | PRN
  Administered 2012-09-04: .8 mg via INTRAVENOUS

## 2012-09-04 MED ORDER — DEXAMETHASONE SODIUM PHOSPHATE 4 MG/ML IJ SOLN
INTRAMUSCULAR | Status: DC | PRN
  Administered 2012-09-04: 4 mg via INTRAVENOUS

## 2012-09-04 MED ORDER — METOCLOPRAMIDE HCL 5 MG/ML IJ SOLN: 10.0000 mg | Freq: Once | INTRAMUSCULAR | Status: DC | PRN

## 2012-09-04 MED ORDER — ROCURONIUM BROMIDE 100 MG/10ML IV SOLN
INTRAVENOUS | Status: DC | PRN
  Administered 2012-09-04: 40 mg via INTRAVENOUS

## 2012-09-04 MED ORDER — LIDOCAINE HCL 4 % MT SOLN
OROMUCOSAL | Status: DC | PRN
  Administered 2012-09-04: 4 mL via TOPICAL

## 2012-09-04 MED ORDER — NEOSTIGMINE METHYLSULFATE 1 MG/ML IJ SOLN
INTRAMUSCULAR | Status: DC | PRN
  Administered 2012-09-04: 5 mg via INTRAVENOUS

## 2012-09-04 MED ORDER — LIDOCAINE HCL (CARDIAC) 20 MG/ML IV SOLN
INTRAVENOUS | Status: DC | PRN
  Administered 2012-09-04: 40 mg via INTRAVENOUS

## 2012-09-04 MED ORDER — IOHEXOL 350 MG/ML SOLN
INTRAVENOUS | Status: DC | PRN
  Administered 2012-09-04: 20 mL via INTRAVENOUS

## 2012-09-04 MED ORDER — FENTANYL CITRATE 0.05 MG/ML IJ SOLN: 25.0000 ug | INTRAMUSCULAR | Status: DC | PRN

## 2012-09-04 MED ORDER — OXYCODONE HCL 5 MG/5ML PO SOLN: 5.0000 mg | Freq: Once | ORAL | Status: DC | PRN

## 2012-09-04 MED ORDER — ONDANSETRON HCL 4 MG/2ML IJ SOLN
INTRAMUSCULAR | Status: DC | PRN
  Administered 2012-09-04: 4 mg via INTRAVENOUS

## 2012-09-04 MED ORDER — FENTANYL CITRATE 0.05 MG/ML IJ SOLN
INTRAMUSCULAR | Status: DC | PRN
  Administered 2012-09-04: 50 ug via INTRAVENOUS

## 2012-09-04 MED ORDER — OXYCODONE HCL 5 MG PO TABS: 5.0000 mg | ORAL_TABLET | Freq: Once | ORAL | Status: DC | PRN

## 2012-09-04 NOTE — Anesthesia Procedure Notes (Signed)
Procedure Name: Intubation Date/Time: 09/04/2012 11:05 AM Performed by: Leona Singleton A Pre-anesthesia Checklist: Patient identified Patient Re-evaluated:Patient Re-evaluated prior to inductionOxygen Delivery Method: Circle system utilized Preoxygenation: Pre-oxygenation with 100% oxygen Intubation Type: IV induction Ventilation: Mask ventilation without difficulty Laryngoscope Size: Miller and 2 Grade View: Grade I Tube type: Oral Tube size: 7.0 mm Number of attempts: 1 Airway Equipment and Method: Stylet and LTA kit utilized Placement Confirmation: ETT inserted through vocal cords under direct vision,  positive ETCO2 and breath sounds checked- equal and bilateral Secured at: 22 cm Tube secured with: Tape Dental Injury: Teeth and Oropharynx as per pre-operative assessment

## 2012-09-04 NOTE — Progress Notes (Signed)
Day of Surgery  Subjective: Pt vomited last night, had chills and sweats.  Denies further symptoms.  Still having considerable periumbilical pain, alleviated with pain medication.   Objective: Vital signs in last 24 hours: Temp:  [97.3 F (36.3 C)-98.5 F (36.9 C)] 98.5 F (36.9 C) (07/07 0610) Pulse Rate:  [55-67] 62 (07/07 0610) Resp:  [18] 18 (07/07 0610) BP: (108-126)/(51-73) 122/55 mmHg (07/07 0610) SpO2:  [98 %-100 %] 98 % (07/07 0610) Last BM Date: 09/02/12  Intake/Output from previous day: 07/06 0701 - 07/07 0700 In: 1440 [P.O.:880; I.V.:510; IV Piggyback:50] Out: 6100 [Urine:6100] Intake/Output this shift:    General appearance: alert, cooperative, appears stated age and no distress Resp: clear to auscultation bilaterally, no chest wall tenderness or cyanosis.   Cardio: regular rate and rhythm, S1, S2 normal, no murmur, click, rub or gallop.  +2 pulses, no edema. GI: +BS x4 quadrants. Abdomen if soft, round and diffusely tender.  No HSM Extremities: SCDs.  Skin is pink, dry and intact.  Lab Results:   Recent Labs  09/03/12 0405 09/04/12 0440  WBC 7.5 6.6  HGB 11.7* 11.6*  HCT 35.8* 34.9*  PLT 237 217   BMET  Recent Labs  09/03/12 0405 09/04/12 0440  NA 134* 135  K 3.9 4.1  CL 104 102  CO2 27 28  GLUCOSE 90 90  BUN 4* 3*  CREATININE 0.56 0.64  CALCIUM 7.9* 8.4   Lab Results  Component Value Date   ALT 398* 09/04/2012   AST 131* 09/04/2012   ALKPHOS 133* 09/04/2012   BILITOT 2.6* 09/04/2012    Studies/Results: US Abdomen Complete  09/02/2012   *RADIOLOGY REPORT*  Clinical Data:  Abdominal pain.  Elevated liver enzymes.  COMPLETE ABDOMINAL ULTRASOUND  Comparison:  CT scan of the abdomen dated 12/28/2008  Findings:  Gallbladder:  There are numerous stones in the gallbladder, the largest being 1.9 cm in diameter.  These stones were present on the prior CT scan.  Gallbladder wall is not thickened.  Negative sonographic Murphy's sign.  Common bile duct:   Common bile duct is dilated to a diameter of 10.2 mm.  There are dilated intrahepatic bile ducts.  Liver:  Intrahepatic ductal dilatation.  The liver parenchyma is otherwise normal.  IVC:  Normal.  Pancreas:  Normal.  Spleen:  Normal.  4.2 cm in length.  Right Kidney:  Normal.  10.0 cm in length.  Left Kidney:  Normal.  11.7 cm in length.  Abdominal aorta:  Normal.  2.2 cm maximum diameter.  IMPRESSION: Multiple gallstones.  Biliary ductal dilatation into the head of the pancreas.  The finding is most likely secondary to a distal common bile duct stone although the stone is not identified in the distal duct on this exam.   Original Report Authenticated By: Francene Boyers, M.D.    Anti-infectives: Anti-infectives   Start     Dose/Rate Route Frequency Ordered Stop   09/03/12 1000  piperacillin-tazobactam (ZOSYN) IVPB 3.375 g     3.375 g 12.5 mL/hr over 240 Minutes Intravenous Every 8 hours 09/03/12 0845        Assessment/Plan: Cholelithiasis with CBD obstruction Elevated transaminase  -ERCP this morning by Dr. Christella Hartigan -possible lap chole tomorrow or Wednesday depending on procedure today -repeat LFTs in AM -pain control -may have clears after procedure, then NPO after midnight -continue with zosyn   LOS: 2 days   Bonner Puna [facility] ANP-BC Pager [phone]  09/04/2012 8:41 AM

## 2012-09-04 NOTE — Progress Notes (Signed)
Agree with above, duct cleared. Plan lap chole tomorrow. Marland Kitchen

## 2012-09-04 NOTE — Anesthesia Preprocedure Evaluation (Addendum)
Anesthesia Evaluation  Patient identified by MRN, date of birth, ID band Patient awake    Reviewed: Allergy & Precautions, H&P , NPO status , Patient's Chart, lab work & pertinent test results, reviewed documented beta blocker date and time   Airway Mallampati: II TM Distance: >3 FB Neck ROM: full    Dental  (+) Dental Advisory Given   Pulmonary Current Smoker,  breath sounds clear to auscultation        Cardiovascular negative cardio ROS  Rhythm:regular     Neuro/Psych PSYCHIATRIC DISORDERS Depression negative neurological ROS  negative psych ROS   GI/Hepatic negative GI ROS, Neg liver ROS, Symptomatic choledocholithiasis and elevated LFTs consistent with the same admitted with epigastric abdominal pain found by ultrasound to have multiple gallstones, intra-and extra hepatic biliary ductal dilatation consistent with common bile duct stone/obstruction   Endo/Other  negative endocrine ROS  Renal/GU negative Renal ROS  negative genitourinary   Musculoskeletal negative musculoskeletal ROS (+)   Abdominal (+) + obese,   Peds  Hematology negative hematology ROS (+)   Anesthesia Other Findings See surgeon's H&P   Reproductive/Obstetrics negative OB ROS                          Anesthesia Physical Anesthesia Plan  ASA: II  Anesthesia Plan: General   Post-op Pain Management:    Induction: Intravenous  Airway Management Planned: Oral ETT  Additional Equipment:   Intra-op Plan:   Post-operative Plan: Extubation in OR  Informed Consent: I have reviewed the patients History and Physical, chart, labs and discussed the procedure including the risks, benefits and alternatives for the proposed anesthesia with the patient or authorized representative who has indicated his/her understanding and acceptance.   Dental Advisory Given  Plan Discussed with: CRNA and Surgeon  Anesthesia Plan Comments:          Anesthesia Quick Evaluation

## 2012-09-04 NOTE — Interval H&P Note (Signed)
History and Physical Interval Note:  09/04/2012 10:56 AM  [name]  has presented today for surgery, with the diagnosis of Choledocholithiasis  The various methods of treatment have been discussed with the patient and family. After consideration of risks, benefits and other options for treatment, the patient has consented to  Procedure(s): ENDOSCOPIC RETROGRADE CHOLANGIOPANCREATOGRAPHY (ERCP) (N/A) as a surgical intervention .  The patient's history has been reviewed, patient examined, no change in status, stable for surgery.  I have reviewed the patient's chart and labs.  Questions were answered to the patient's satisfaction.     Rob Bunting

## 2012-09-04 NOTE — H&P (View-Only) (Signed)
[facility] Consultation  Referring Provider: Dr. Lindie Spruce, CCS Primary Care Physician:  Pcp Not In System Primary Gastroenterologist:  none  Reason for Consultation:  CBD stones/obstruction  HPI: [name] is a 27 y.o. female with little past medical history who developed severe epigastric abdominal pain yesterday morning. The pain was sharp in nature without specific radiation. It was associated with nausea but no vomiting. She denied fevers or chills. No change in her bowel habits including no blood in her stool or melena. She denies similar pain in the past.  She denies chest pain, dyspnea.  No trouble swallowing.  No severe heartburn.   History reviewed. No pertinent past medical history.  History reviewed. No pertinent past surgical history.  Prior to Admission medications   Medication Sig Start Date End Date Taking? Authorizing Provider  etonogestrel (IMPLANON) 68 MG IMPL implant Inject 1 each into the skin once.   Yes Historical Provider, MD  metroNIDAZOLE (FLAGYL) 250 MG tablet Take 250 mg by mouth 3 (three) times daily. 08/15/12  Yes Historical Provider, MD  sertraline (ZOLOFT) 50 MG tablet Take 50 mg by mouth daily.   Yes Historical Provider, MD    Current Facility-Administered Medications  Medication Dose Route Frequency Provider Last Rate Last Dose  . 0.9 %  sodium chloride infusion   Intravenous Continuous Leroy Sea, MD 75 mL/hr at 09/03/12 1045    . acetaminophen (TYLENOL) tablet 650 mg  650 mg Oral Q6H PRN Ron Parker, MD       Or  . acetaminophen (TYLENOL) suppository 650 mg  650 mg Rectal Q6H PRN Ron Parker, MD      . alum & mag hydroxide-simeth (MAALOX/MYLANTA) 200-200-20 MG/5ML suspension 30 mL  30 mL Oral Q6H PRN Ron Parker, MD      . antiseptic oral rinse (BIOTENE) solution 15 mL  15 mL Mouth Rinse q12n4p Ron Parker, MD   15 mL at 09/03/12 1200  . chlorhexidine (PERIDEX) 0.12 % solution 15 mL  15 mL Mouth  Rinse BID Ron Parker, MD   15 mL at 09/03/12 0859  . HYDROmorphone (DILAUDID) injection 0.5-1 mg  0.5-1 mg Intravenous Q3H PRN Ron Parker, MD   1 mg at 09/03/12 1324  . ondansetron (ZOFRAN) tablet 4 mg  4 mg Oral Q6H PRN Ron Parker, MD       Or  . ondansetron (ZOFRAN) injection 4 mg  4 mg Intravenous Q6H PRN Ron Parker, MD      . oxyCODONE (Oxy IR/ROXICODONE) immediate release tablet 5 mg  5 mg Oral Q4H PRN Ron Parker, MD      . pantoprazole (PROTONIX) injection 40 mg  40 mg Intravenous Q12H Ron Parker, MD   40 mg at 09/03/12 0958  . piperacillin-tazobactam (ZOSYN) IVPB 3.375 g  3.375 g Intravenous Q8H Madolyn Frieze, RPH   3.375 g at 09/03/12 1156  . sertraline (ZOLOFT) tablet 50 mg  50 mg Oral Daily Ron Parker, MD   50 mg at 09/03/12 0958  . zolpidem (AMBIEN) tablet 5 mg  5 mg Oral QHS PRN Ron Parker, MD        Allergies as of 09/02/2012  . (No Known Allergies)    Family History  Problem Relation Age of Onset  . CAD Maternal Grandmother   . Hypertension Mother   . Diabetes Mother   . Breast cancer Maternal Aunt     History   Social History  .  Marital Status: Single    Spouse Name: N/A    Number of Children: N/A  . Years of Education: N/A   Occupational History  . Not on file.   Social History Main Topics  . Smoking status: Current Every Day Smoker  . Smokeless tobacco: Not on file  . Alcohol Use: Yes  . Drug Use: No  . Sexually Active: Not on file   Other Topics Concern  . Not on file   Social History Narrative  . No narrative on file    Review of Systems: As per history of present illness, otherwise negative  Physical Exam: Vital signs in last 24 hours: Temp:  [97.4 F (36.3 C)-98.8 F (37.1 C)] 97.4 F (36.3 C) (07/06 1008) Pulse Rate:  [55-85] 55 (07/06 1008) Resp:  [18-19] 18 (07/06 1008) BP: (104-131)/(58-85) 121/73 mmHg (07/06 1008) SpO2:  [97 %-100 %] 99 % (07/06  1008) Weight:  [175 lb 11.3 oz (79.7 kg)] 175 lb 11.3 oz (79.7 kg) (07/05 2124) Last BM Date: 09/02/12 Gen: awake, alert, NAD HEENT: anicteric, op clear CV: RRR, no mrg Pulm: CTA b/l Abd: soft, mild to moderate epigastric tenderness without rebound or guarding, nondistended, +BS throughout Ext: no c/c/e Neuro: nonfocal   Intake/Output from previous day: 07/05 0701 - 07/06 0700 In: -  Out: 1 [Urine:1] Intake/Output this shift: Total I/O In: -  Out: 1300 [Urine:1300]  Lab Results:  Recent Labs  09/02/12 1504 09/03/12 0405  WBC 9.0 7.5  HGB 13.5 11.7*  HCT 39.5 35.8*  PLT 248 237   BMET  Recent Labs  09/02/12 1504 09/03/12 0405  NA 138 134*  K 3.3* 3.9  CL 101 104  CO2 25 27  GLUCOSE 123* 90  BUN 9 4*  CREATININE 0.55 0.56  CALCIUM 9.0 7.9*   LFT  Recent Labs  09/02/12 1504  PROT 7.0  ALBUMIN 3.8  AST 814*  ALT 501*  ALKPHOS 102  BILITOT 1.4*    Studies/Results: US Abdomen Complete  09/02/2012   *RADIOLOGY REPORT*  Clinical Data:  Abdominal pain.  Elevated liver enzymes.  COMPLETE ABDOMINAL ULTRASOUND  Comparison:  CT scan of the abdomen dated 12/28/2008  Findings:  Gallbladder:  There are numerous stones in the gallbladder, the largest being 1.9 cm in diameter.  These stones were present on the prior CT scan.  Gallbladder wall is not thickened.  Negative sonographic Murphy's sign.  Common bile duct:  Common bile duct is dilated to a diameter of 10.2 mm.  There are dilated intrahepatic bile ducts.  Liver:  Intrahepatic ductal dilatation.  The liver parenchyma is otherwise normal.  IVC:  Normal.  Pancreas:  Normal.  Spleen:  Normal.  4.2 cm in length.  Right Kidney:  Normal.  10.0 cm in length.  Left Kidney:  Normal.  11.7 cm in length.  Abdominal aorta:  Normal.  2.2 cm maximum diameter.  IMPRESSION: Multiple gallstones.  Biliary ductal dilatation into the head of the pancreas.  The finding is most likely secondary to a distal common bile duct stone although  the stone is not identified in the distal duct on this exam.   Original Report Authenticated By: Francene Boyers, M.D.     Previous Endoscopies: none  Impression/ Recommendations: 27 year old female admitted with epigastric abdominal pain found by ultrasound to have multiple gallstones, intra-and extra hepatic biliary ductal dilatation consistent with common bile duct stone/obstruction.  1.  Choledocholithiasis --  patient has symptomatic choledocholithiasis and elevated LFTs consistent with the same.  I entertained MRCP, with the thoughts of a negative study may allow for cholecystectomy tomorrow.  Dr. Lindie Spruce feels that ERCP is indicated first, and I do not disagree.  She has symptoms, LFT pattern, and imaging consistent with CBD stone(s).  I discussed ERCP with her at length including the risks and benefits and she is agreeable to proceed. We discussed the risk of pancreatitis, bleeding, infection, sedation-related risks.  Time was provided for questions and answers and we reviewed the hepatobiliary anatomy together so that she would understand the planned procedure. --clear liquid diet now --NPO after MN --ERCP tomorrow with Dr. Christella Hartigan in OR (awaiting time per OR staff) --pain control, IVFs  2.  Gallstones -- Gen. surgery plans cholecystectomy after ERCP, likely Tuesday   LOS: 1 day   Atoya Andrew M  09/03/2012, 1:54 PM

## 2012-09-04 NOTE — Preoperative (Signed)
Beta Blockers   Reason not to administer Beta Blockers:Not Applicable 

## 2012-09-04 NOTE — Transfer of Care (Signed)
Immediate Anesthesia Transfer of Care Note  Patient: [name]  Procedure(s) Performed: Procedure(s): ENDOSCOPIC RETROGRADE CHOLANGIOPANCREATOGRAPHY (ERCP) (N/A)  Patient Location: PACU  Anesthesia Type:General  Level of Consciousness: awake, alert , oriented and patient cooperative  Airway & Oxygen Therapy: Patient Spontanous Breathing and Patient connected to nasal cannula oxygen  Post-op Assessment: Report given to PACU RN and Post -op Vital signs reviewed and stable  Post vital signs: Reviewed and stable  Complications: No apparent anesthesia complications

## 2012-09-04 NOTE — Anesthesia Postprocedure Evaluation (Signed)
Anesthesia Post Note  Patient: [name]  Procedure(s) Performed: Procedure(s) (LRB): ENDOSCOPIC RETROGRADE CHOLANGIOPANCREATOGRAPHY (ERCP) (N/A)  Anesthesia type: General  Patient location: PACU  Post pain: Pain level controlled  Post assessment: Patient's Cardiovascular Status Stable  Last Vitals:  Filed Vitals:   09/04/12 1314  BP: 121/68  Pulse: 52  Temp: 36.6 C  Resp: 16    Post vital signs: Reviewed and stable  Level of consciousness: alert  Complications: No apparent anesthesia complications

## 2012-09-04 NOTE — Progress Notes (Signed)
[facility]                                                                                Patient Demographics  [name], is a 27 y.o. female, DOB - 1985/04/15, ZOX:[id number], WJX:[id number]  Admit date - 09/02/2012  Admitting Physician Ron Parker, MD  Outpatient Primary MD for the patient is Pcp Not In System  LOS - 2   Chief Complaint  Patient presents with  . Abdominal Pain        Assessment & Plan    1. Right upper quadrant abdominal pain with nausea vomiting secondary to   Cholelithiasis with obstruction, elevated transaminases. Patient is n.p.o., IV fluids, empiric IV Zosyn for now, due for ERCP later today it could not be done on 09/03/2012 due to scheduling conflicts, have also consulted Gen. surgery for eventual cholecystectomy likely on 09/05/2012, patient clinically looks stable right now.   2. Nausea vomiting causing hyperkalemia due to #1 above. Stable now, supportive care, potassium repleted and stable.   3. H/O depression home dose Zoloft to continue once taking by mouth.    Code Status: Full  Family Communication: None present  Disposition Plan: Home   Procedures right upper quadrant ultrasound, scheduled for ERCP on 09/04/2012, likely laparoscopy cholecystectomy on 09/05/2012   Consults  GI and general surgery   DVT Prophylaxis    SCDs    Lab Results  Component Value Date   PLT 217 09/04/2012    Medications  Scheduled Meds: . antiseptic oral rinse  15 mL Mouth Rinse q12n4p  . chlorhexidine  15 mL Mouth Rinse BID  . pantoprazole (PROTONIX) IV  40 mg Intravenous Q12H  . piperacillin-tazobactam (ZOSYN)  IV  3.375 g Intravenous Q8H  . sertraline  50 mg Oral Daily   Continuous Infusions: . sodium chloride 75 mL/hr at 09/03/12 1814   PRN Meds:.acetaminophen, acetaminophen, alum & mag hydroxide-simeth, HYDROmorphone (DILAUDID) injection, ondansetron (ZOFRAN) IV, ondansetron, oxyCODONE, zolpidem  Antibiotics      Anti-infectives   Start     Dose/Rate Route Frequency Ordered Stop   09/03/12 1000  piperacillin-tazobactam (ZOSYN) IVPB 3.375 g     3.375 g 12.5 mL/hr over 240 Minutes Intravenous Every 8 hours 09/03/12 0845         Time Spent in minutes   35   SINGH,PRASHANT K M.D on 09/04/2012 at 8:57 AM  Between 7am to 7pm - Pager - [phone]  After 7pm go to [email or web address] - password [email or web address]  And look for the night coverage person covering for me after hours  [facility] Office  [phone]    Subjective:   [name] today has, No headache, No chest pain, No abdominal pain - mild Nausea, No new weakness tingling or numbness, No Cough - SOB.    Objective:   Filed Vitals:   09/03/12 1837 09/03/12 2224 09/04/12 0223 09/04/12 0610  BP: 126/71 108/59 120/51 122/55  Pulse: 59 64 65 62  Temp: 97.5 F (36.4 C) 98.2 F (36.8 C) 97.9 F (36.6 C) 98.5 F (36.9 C)  TempSrc:  Oral    Resp: 18 18 18 18   Height:      Weight:  SpO2: 100% 100% 100% 98%    Wt Readings from Last 3 Encounters:  09/02/12 79.7 kg (175 lb 11.3 oz)  09/02/12 79.7 kg (175 lb 11.3 oz)     Intake/Output Summary (Last 24 hours) at 09/04/12 0857 Last data filed at 09/04/12 0700  Gross per 24 hour  Intake   1440 ml  Output   5200 ml  Net  -3760 ml    Exam Awake Alert, Oriented X 3, No new F.N deficits, Normal affect [place].AT,PERRAL Supple Neck,No JVD, No cervical lymphadenopathy appriciated.  Symmetrical Chest wall movement, Good air movement bilaterally, CTAB RRR,No Gallops,Rubs or new Murmurs, No Parasternal Heave +ve B.Sounds, Abd Soft, Non tender, No organomegaly appriciated, No rebound - guarding or rigidity. No Cyanosis, Clubbing or edema, No new Rash or bruise     Data Review   Micro Results No results found for this or any previous visit (from the past 240 hour(s)).  Radiology Reports US Abdomen Complete  09/02/2012   *RADIOLOGY REPORT*  Clinical Data:  Abdominal pain.   Elevated liver enzymes.  COMPLETE ABDOMINAL ULTRASOUND  Comparison:  CT scan of the abdomen dated 12/28/2008  Findings:  Gallbladder:  There are numerous stones in the gallbladder, the largest being 1.9 cm in diameter.  These stones were present on the prior CT scan.  Gallbladder wall is not thickened.  Negative sonographic Murphy's sign.  Common bile duct:  Common bile duct is dilated to a diameter of 10.2 mm.  There are dilated intrahepatic bile ducts.  Liver:  Intrahepatic ductal dilatation.  The liver parenchyma is otherwise normal.  IVC:  Normal.  Pancreas:  Normal.  Spleen:  Normal.  4.2 cm in length.  Right Kidney:  Normal.  10.0 cm in length.  Left Kidney:  Normal.  11.7 cm in length.  Abdominal aorta:  Normal.  2.2 cm maximum diameter.  IMPRESSION: Multiple gallstones.  Biliary ductal dilatation into the head of the pancreas.  The finding is most likely secondary to a distal common bile duct stone although the stone is not identified in the distal duct on this exam.   Original Report Authenticated By: Francene Boyers, M.D.    [facility]  Recent Labs Lab 09/02/12 1504 09/03/12 0405 09/04/12 0440  WBC 9.0 7.5 6.6  HGB 13.5 11.7* 11.6*  HCT 39.5 35.8* 34.9*  PLT 248 237 217  MCV 92.3 94.5 93.1  MCH 31.5 30.9 30.9  MCHC 34.2 32.7 33.2  RDW 13.3 13.5 13.2  LYMPHSABS 1.6  --   --   MONOABS 0.7  --   --   EOSABS 0.1  --   --   BASOSABS 0.0  --   --     Chemistries   Recent Labs Lab 09/02/12 1504 09/03/12 0405 09/04/12 0440  NA 138 134* 135  K 3.3* 3.9 4.1  CL 101 104 102  CO2 25 27 28   GLUCOSE 123* 90 90  BUN 9 4* 3*  CREATININE 0.55 0.56 0.64  CALCIUM 9.0 7.9* 8.4  AST 814*  --  131*  ALT 501*  --  398*  ALKPHOS 102  --  133*  BILITOT 1.4*  --  2.6*   ------------------------------------------------------------------------------------------------------------------ estimated creatinine clearance is 107.9 ml/min (by C-G formula based on Cr of  0.64). ------------------------------------------------------------------------------------------------------------------ No results found for this basename: HGBA1C,  in the last 72 hours ------------------------------------------------------------------------------------------------------------------ No results found for this basename: CHOL, HDL, LDLCALC, TRIG, CHOLHDL, LDLDIRECT,  in the last 72 hours ------------------------------------------------------------------------------------------------------------------ No results found for this  basename: TSH, T4TOTAL, FREET3, T3FREE, THYROIDAB,  in the last 72 hours ------------------------------------------------------------------------------------------------------------------ No results found for this basename: VITAMINB12, FOLATE, FERRITIN, TIBC, IRON, RETICCTPCT,  in the last 72 hours  Coagulation profile No results found for this basename: INR, PROTIME,  in the last 168 hours  No results found for this basename: DDIMER,  in the last 72 hours  Cardiac Enzymes No results found for this basename: CK, CKMB, TROPONINI, MYOGLOBIN,  in the last 168 hours ------------------------------------------------------------------------------------------------------------------ No components found with this basename: POCBNP,

## 2012-09-04 NOTE — Op Note (Signed)
[facility] [address] [place], [address]   ERCP PROCEDURE REPORT  PATIENT: [name], [name].  MR# :[id number] BIRTHDATE: November 22, 1985  GENDER: Female ENDOSCOPIST: Rachael Fee, MD PROCEDURE DATE:  09/04/2012 PROCEDURE:   ERCP with sphincterotomy/papillotomy and ERCP with removal of calculus/calculi ASA CLASS:   Class I INDICATIONS:gallstones in GB, elevated liver tests, dilated CBD. MEDICATIONS: General endotracheal anesthesia (GETA) TOPICAL ANESTHETIC: none  DESCRIPTION OF PROCEDURE:   After the risks benefits and alternatives of the procedure were thoroughly explained, informed consent was obtained.  The Pentax Ercp Scope [id number]  endoscope was introduced through the mouth  and advanced to the second portion of the duodenum without detailed examination of the UGI tract.  The major papilla was normal.  A 44 Autotome over .035 hydrawire was used to cannulate the bile duct and contrast was injected.  An identical wire was placed into the main pancreatic duct temporarily to facilitate biliary cannulation but dye was never injected into the PD.  Cholangiogram showed a non-dilated biliary tree with a single round, floating stone. The cystic duct and gallbladder patially opacified, several stones were clearly noted in GB.  An adequate biliary sphincterotomy was performed and then the duct was swept several times with a biliary balloon.  Several small stone fragments were delivered into the duodenum, but there was no purulence.  A completion, occlusion cholangiogram showed no persistent filling defects. The scope was then completely withdrawn from the patient and the procedure terminated.     COMPLICATIONS: None  ENDOSCOPIC IMPRESSION: Choledocholithiasis, treated today with biliary sphincterotomy and balloon sweeping  RECOMMENDATIONS: Cholecystectomy, timing per general surgery.   _______________________________ eSignedRachael Fee, MD 09/04/2012 11:55 AM

## 2012-09-05 ENCOUNTER — Encounter (HOSPITAL_COMMUNITY): Payer: Self-pay | Admitting: Anesthesiology

## 2012-09-05 ENCOUNTER — Inpatient Hospital Stay (HOSPITAL_COMMUNITY): Payer: Medicaid Other | Admitting: Anesthesiology

## 2012-09-05 ENCOUNTER — Encounter (HOSPITAL_COMMUNITY): Payer: Self-pay | Admitting: Gastroenterology

## 2012-09-05 ENCOUNTER — Encounter (HOSPITAL_COMMUNITY)
Admission: EM | Disposition: A | Discharge status: Home / Self Care | Payer: Self-pay | Source: Home / Self Care | Attending: Internal Medicine

## 2012-09-05 DIAGNOSIS — K8000 Calculus of gallbladder with acute cholecystitis without obstruction: Secondary | ICD-10-CM

## 2012-09-05 HISTORY — PX: CHOLECYSTECTOMY: SHX55

## 2012-09-05 LAB — URINALYSIS, ROUTINE W REFLEX MICROSCOPIC
Glucose, UA: NEGATIVE mg/dL
Hgb urine dipstick: NEGATIVE
Ketones, ur: 15 mg/dL — AB
pH: 6 (ref 5.0–8.0)

## 2012-09-05 LAB — COMPREHENSIVE METABOLIC PANEL
ALT: 318 U/L — ABNORMAL HIGH (ref 0–35)
AST: 59 U/L — ABNORMAL HIGH (ref 0–37)
Albumin: 3.3 g/dL — ABNORMAL LOW (ref 3.5–5.2)
Alkaline Phosphatase: 140 U/L — ABNORMAL HIGH (ref 39–117)
Chloride: 100 mEq/L (ref 96–112)
Potassium: 3.5 mEq/L (ref 3.5–5.1)
Sodium: 136 mEq/L (ref 135–145)
Total Protein: 6.4 g/dL (ref 6.0–8.3)

## 2012-09-05 LAB — CBC
HCT: 36.8 % (ref 36.0–46.0)
MCH: 30.7 pg (ref 26.0–34.0)
MCHC: 33.2 g/dL (ref 30.0–36.0)
MCV: 92.5 fL (ref 78.0–100.0)
RDW: 12.9 % (ref 11.5–15.5)

## 2012-09-05 LAB — URINE MICROSCOPIC-ADD ON

## 2012-09-05 SURGERY — LAPAROSCOPIC CHOLECYSTECTOMY WITH INTRAOPERATIVE CHOLANGIOGRAM
Anesthesia: General | Site: Abdomen | Wound class: Clean Contaminated

## 2012-09-05 MED ORDER — CHLORHEXIDINE GLUCONATE CLOTH 2 % EX PADS
6.0000 | MEDICATED_PAD | Freq: Every day | CUTANEOUS | Status: DC
  Administered 2012-09-05 – 2012-09-06 (×2): 6 via TOPICAL

## 2012-09-05 MED ORDER — ONDANSETRON HCL 4 MG/2ML IJ SOLN
INTRAMUSCULAR | Status: DC | PRN
  Administered 2012-09-05: 4 mg via INTRAVENOUS

## 2012-09-05 MED ORDER — DEXAMETHASONE SODIUM PHOSPHATE 4 MG/ML IJ SOLN
INTRAMUSCULAR | Status: DC | PRN
  Administered 2012-09-05: 8 mg via INTRAVENOUS

## 2012-09-05 MED ORDER — OXYCODONE HCL 5 MG PO TABS: 5.0000 mg | ORAL_TABLET | Freq: Once | ORAL | Status: DC | PRN

## 2012-09-05 MED ORDER — HYDROMORPHONE HCL PF 1 MG/ML IJ SOLN
INTRAMUSCULAR | Status: AC
  Administered 2012-09-05: 0.5 mg via INTRAVENOUS
  Filled 2012-09-05: qty 1

## 2012-09-05 MED ORDER — LACTATED RINGERS IV SOLN
INTRAVENOUS | Status: DC | PRN
  Administered 2012-09-05 (×2): via INTRAVENOUS

## 2012-09-05 MED ORDER — OXYCODONE-ACETAMINOPHEN 5-325 MG PO TABS
1.0000 | ORAL_TABLET | ORAL | Status: DC | PRN
  Administered 2012-09-05 – 2012-09-07 (×10): 2 via ORAL
  Filled 2012-09-05 (×10): qty 2

## 2012-09-05 MED ORDER — BUPIVACAINE-EPINEPHRINE 0.25% -1:200000 IJ SOLN
INTRAMUSCULAR | Status: DC | PRN
  Administered 2012-09-05: 10 mL

## 2012-09-05 MED ORDER — ROCURONIUM BROMIDE 100 MG/10ML IV SOLN
INTRAVENOUS | Status: DC | PRN
  Administered 2012-09-05: 50 mg via INTRAVENOUS

## 2012-09-05 MED ORDER — GLYCOPYRROLATE 0.2 MG/ML IJ SOLN
INTRAMUSCULAR | Status: DC | PRN
  Administered 2012-09-05: 0.6 mg via INTRAVENOUS

## 2012-09-05 MED ORDER — SODIUM CHLORIDE 0.9 % IV SOLN
INTRAVENOUS | Status: AC
  Administered 2012-09-05: 18:00:00 via INTRAVENOUS

## 2012-09-05 MED ORDER — LIDOCAINE HCL (CARDIAC) 20 MG/ML IV SOLN
INTRAVENOUS | Status: DC | PRN
  Administered 2012-09-05: 50 mg via INTRAVENOUS

## 2012-09-05 MED ORDER — LACTATED RINGERS IV SOLN
INTRAVENOUS | Status: DC
  Administered 2012-09-05: 10:00:00 via INTRAVENOUS

## 2012-09-05 MED ORDER — SODIUM CHLORIDE 0.9 % IR SOLN
Status: DC | PRN
  Administered 2012-09-05: 1000 mL

## 2012-09-05 MED ORDER — BUPIVACAINE-EPINEPHRINE PF 0.25-1:200000 % IJ SOLN
INTRAMUSCULAR | Status: AC
  Filled 2012-09-05: qty 30

## 2012-09-05 MED ORDER — NEOSTIGMINE METHYLSULFATE 1 MG/ML IJ SOLN
INTRAMUSCULAR | Status: DC | PRN
  Administered 2012-09-05: 4 mg via INTRAVENOUS

## 2012-09-05 MED ORDER — PROPOFOL 10 MG/ML IV BOLUS
INTRAVENOUS | Status: DC | PRN
  Administered 2012-09-05: 150 mg via INTRAVENOUS

## 2012-09-05 MED ORDER — SODIUM CHLORIDE 0.9 % IV SOLN: INTRAVENOUS | Status: DC

## 2012-09-05 MED ORDER — 0.9 % SODIUM CHLORIDE (POUR BTL) OPTIME
TOPICAL | Status: DC | PRN
  Administered 2012-09-05: 1000 mL

## 2012-09-05 MED ORDER — FENTANYL CITRATE 0.05 MG/ML IJ SOLN
INTRAMUSCULAR | Status: DC | PRN
  Administered 2012-09-05: 50 ug via INTRAVENOUS
  Administered 2012-09-05: 100 ug via INTRAVENOUS
  Administered 2012-09-05 (×2): 50 ug via INTRAVENOUS

## 2012-09-05 MED ORDER — HYDROMORPHONE HCL PF 1 MG/ML IJ SOLN
0.2500 mg | INTRAMUSCULAR | Status: DC | PRN
  Administered 2012-09-05: 0.5 mg via INTRAVENOUS

## 2012-09-05 MED ORDER — MUPIROCIN 2 % EX OINT
1.0000 "application " | TOPICAL_OINTMENT | Freq: Two times a day (BID) | CUTANEOUS | Status: DC
  Administered 2012-09-05 – 2012-09-07 (×6): 1 via NASAL
  Filled 2012-09-05: qty 22

## 2012-09-05 MED ORDER — ARTIFICIAL TEARS OP OINT
TOPICAL_OINTMENT | OPHTHALMIC | Status: DC | PRN
  Administered 2012-09-05: 1 via OPHTHALMIC

## 2012-09-05 MED ORDER — PROMETHAZINE HCL 25 MG/ML IJ SOLN: 6.2500 mg | INTRAMUSCULAR | Status: DC | PRN

## 2012-09-05 MED ORDER — OXYCODONE HCL 5 MG/5ML PO SOLN: 5.0000 mg | Freq: Once | ORAL | Status: DC | PRN

## 2012-09-05 MED ORDER — MIDAZOLAM HCL 5 MG/5ML IJ SOLN
INTRAMUSCULAR | Status: DC | PRN
  Administered 2012-09-05: 2 mg via INTRAVENOUS

## 2012-09-05 MED ORDER — IBUPROFEN 600 MG PO TABS
600.0000 mg | ORAL_TABLET | Freq: Three times a day (TID) | ORAL | Status: DC
  Administered 2012-09-05 – 2012-09-07 (×6): 600 mg via ORAL
  Filled 2012-09-05 (×10): qty 1

## 2012-09-05 MED ORDER — PIPERACILLIN-TAZOBACTAM 3.375 G IVPB
3.3750 g | Freq: Three times a day (TID) | INTRAVENOUS | Status: DC
  Administered 2012-09-05 – 2012-09-06 (×2): 3.375 g via INTRAVENOUS
  Filled 2012-09-05 (×3): qty 50

## 2012-09-05 SURGICAL SUPPLY — 41 items
APPLIER CLIP 5 13 M/L LIGAMAX5 (MISCELLANEOUS) ×2
BLADE SURG ROTATE 9660 (MISCELLANEOUS) IMPLANT
CANISTER SUCTION 2500CC (MISCELLANEOUS) ×2 IMPLANT
CHLORAPREP W/TINT 26ML (MISCELLANEOUS) ×2 IMPLANT
CLIP APPLIE 5 13 M/L LIGAMAX5 (MISCELLANEOUS) ×1 IMPLANT
CLOTH BEACON ORANGE TIMEOUT ST (SAFETY) ×2 IMPLANT
COVER MAYO STAND STRL (DRAPES) ×2 IMPLANT
COVER SURGICAL LIGHT HANDLE (MISCELLANEOUS) ×2 IMPLANT
DECANTER SPIKE VIAL GLASS SM (MISCELLANEOUS) IMPLANT
DERMABOND ADVANCED (GAUZE/BANDAGES/DRESSINGS) ×1
DERMABOND ADVANCED .7 DNX12 (GAUZE/BANDAGES/DRESSINGS) ×1 IMPLANT
DRAPE C-ARM 42X72 X-RAY (DRAPES) IMPLANT
ELECT REM PT RETURN 9FT ADLT (ELECTROSURGICAL) ×2
ELECTRODE REM PT RTRN 9FT ADLT (ELECTROSURGICAL) ×1 IMPLANT
GLOVE BIO SURGEON STRL SZ7 (GLOVE) ×2 IMPLANT
GLOVE BIO SURGEON STRL SZ7.5 (GLOVE) ×4 IMPLANT
GLOVE BIOGEL PI IND STRL 7.0 (GLOVE) ×1 IMPLANT
GLOVE BIOGEL PI IND STRL 7.5 (GLOVE) ×2 IMPLANT
GLOVE BIOGEL PI INDICATOR 7.0 (GLOVE) ×1
GLOVE BIOGEL PI INDICATOR 7.5 (GLOVE) ×2
GLOVE ECLIPSE 7.5 STRL STRAW (GLOVE) ×2 IMPLANT
GLOVE SS BIOGEL STRL SZ 6.5 (GLOVE) ×1 IMPLANT
GLOVE SUPERSENSE BIOGEL SZ 6.5 (GLOVE) ×1
GOWN STRL NON-REIN LRG LVL3 (GOWN DISPOSABLE) ×6 IMPLANT
GOWN STRL REIN XL XLG (GOWN DISPOSABLE) ×2 IMPLANT
KIT BASIN OR (CUSTOM PROCEDURE TRAY) ×2 IMPLANT
KIT ROOM TURNOVER OR (KITS) ×2 IMPLANT
NS IRRIG 1000ML POUR BTL (IV SOLUTION) ×2 IMPLANT
PAD ARMBOARD 7.5X6 YLW CONV (MISCELLANEOUS) ×2 IMPLANT
POUCH SPECIMEN RETRIEVAL 10MM (ENDOMECHANICALS) ×2 IMPLANT
SCISSORS LAP 5X35 DISP (ENDOMECHANICALS) IMPLANT
SET CHOLANGIOGRAPH 5 50 .035 (SET/KITS/TRAYS/PACK) IMPLANT
SET IRRIG TUBING LAPAROSCOPIC (IRRIGATION / IRRIGATOR) ×2 IMPLANT
SLEEVE ENDOPATH XCEL 5M (ENDOMECHANICALS) ×4 IMPLANT
SPECIMEN JAR SMALL (MISCELLANEOUS) ×2 IMPLANT
SUT MNCRL AB 4-0 PS2 18 (SUTURE) ×2 IMPLANT
TOWEL OR 17X24 6PK STRL BLUE (TOWEL DISPOSABLE) ×2 IMPLANT
TOWEL OR 17X26 10 PK STRL BLUE (TOWEL DISPOSABLE) ×2 IMPLANT
TRAY LAPAROSCOPIC (CUSTOM PROCEDURE TRAY) ×2 IMPLANT
TROCAR XCEL BLUNT TIP 100MML (ENDOMECHANICALS) ×2 IMPLANT
TROCAR XCEL NON-BLD 5MMX100MML (ENDOMECHANICALS) ×2 IMPLANT

## 2012-09-05 NOTE — Anesthesia Postprocedure Evaluation (Signed)
Anesthesia Post Note  Patient: [name]  Procedure(s) Performed: Procedure(s) (LRB): LAPAROSCOPIC CHOLECYSTECTOMY (N/A)  Anesthesia type: general  Patient location: PACU  Post pain: Pain level controlled  Post assessment: Patient's Cardiovascular Status Stable  Last Vitals:  Filed Vitals:   09/05/12 1230  BP: 149/86  Pulse: 56  Temp:   Resp: 14    Post vital signs: Reviewed and stable  Level of consciousness: sedated  Complications: No apparent anesthesia complications

## 2012-09-05 NOTE — Anesthesia Preprocedure Evaluation (Addendum)
Anesthesia Evaluation  Patient identified by MRN, date of birth, ID band Patient awake    Reviewed: Allergy & Precautions, H&P , NPO status , Patient's Chart, lab work & pertinent test results  History of Anesthesia Complications Negative for: history of anesthetic complications  Airway Mallampati: II TM Distance: >3 FB Neck ROM: Full    Dental  (+) Dental Advisory Given   Pulmonary neg pulmonary ROS, Current Smoker,  breath sounds clear to auscultation        Cardiovascular negative cardio ROS  Rhythm:regular Rate:Normal     Neuro/Psych negative neurological ROS  negative psych ROS   GI/Hepatic negative GI ROS, Neg liver ROS,   Endo/Other  negative endocrine ROS  Renal/GU negative Renal ROS     Musculoskeletal   Abdominal   Peds  Hematology negative hematology ROS (+)   Anesthesia Other Findings   Reproductive/Obstetrics negative OB ROS                         Anesthesia Physical Anesthesia Plan  ASA: II  Anesthesia Plan: General   Post-op Pain Management:    Induction: Intravenous  Airway Management Planned: Oral ETT  Additional Equipment:   Intra-op Plan:   Post-operative Plan: Extubation in OR  Informed Consent: I have reviewed the patients History and Physical, chart, labs and discussed the procedure including the risks, benefits and alternatives for the proposed anesthesia with the patient or authorized representative who has indicated his/her understanding and acceptance.   Dental advisory given  Plan Discussed with: Anesthesiologist, CRNA and Surgeon  Anesthesia Plan Comments:        Anesthesia Quick Evaluation

## 2012-09-05 NOTE — Progress Notes (Signed)
[facility]                                                                                Patient Demographics  [name], is a 27 y.o. female, DOB - Dec 10, 1985, QMV:[id number], MWU:[id number]  Admit date - 09/02/2012  Admitting Physician Ron Parker, MD  Outpatient Primary MD for the patient is Pcp Not In System  LOS - 3   Chief Complaint  Patient presents with  . Abdominal Pain        Assessment & Plan    1. Right upper quadrant abdominal pain with nausea vomiting secondary to   Cholelithiasis with obstruction, elevated transaminases. Patient is n.p.o., IV fluids, empiric IV Zosyn for now, he went ERCP by GI on 09/04/2012, general surgery also following the patient and they have scheduled laparoscopic cholecystectomy on 09/05/2012 around 2 PM.   2. Nausea vomiting causing hyperkalemia due to #1 above. Stable now, supportive care, potassium repleted and stable. Gentle IV fluids to continue for the next few hours and she comes back from OR.   3. H/O depression home dose Zoloft to continue once taking by mouth.    Code Status: Full  Family Communication: None present  Disposition Plan: Home   Procedures right upper quadrant ultrasound, scheduled for ERCP on 09/04/2012, likely laparoscopy cholecystectomy on 09/05/2012   Consults  GI and general surgery   DVT Prophylaxis    SCDs    Lab Results  Component Value Date   PLT 258 09/05/2012    Medications  Scheduled Meds: . antiseptic oral rinse  15 mL Mouth Rinse q12n4p  . chlorhexidine  15 mL Mouth Rinse BID  . Chlorhexidine Gluconate Cloth  6 each Topical Daily  . mupirocin ointment  1 application Nasal BID  . pantoprazole (PROTONIX) IV  40 mg Intravenous Q12H  . piperacillin-tazobactam (ZOSYN)  IV  3.375 g Intravenous Q8H  . sertraline  50 mg Oral Daily   Continuous Infusions: . sodium chloride     PRN Meds:.acetaminophen, acetaminophen, alum & mag hydroxide-simeth, HYDROmorphone  (DILAUDID) injection, menthol-cetylpyridinium, ondansetron (ZOFRAN) IV, ondansetron, oxyCODONE, zolpidem  Antibiotics     Anti-infectives   Start     Dose/Rate Route Frequency Ordered Stop   09/03/12 1000  piperacillin-tazobactam (ZOSYN) IVPB 3.375 g     3.375 g 12.5 mL/hr over 240 Minutes Intravenous Every 8 hours 09/03/12 0845         Time Spent in minutes   35   Austen Oyster K M.D on 09/05/2012 at 8:58 AM  Between 7am to 7pm - Pager - [phone]  After 7pm go to [email or web address] - password [email or web address]  And look for the night coverage person covering for me after hours  [facility] Office  [phone]    Subjective:   [name] today has, No headache, No chest pain, No abdominal pain - mild Nausea, No new weakness tingling or numbness, No Cough - SOB.    Objective:   Filed Vitals:   09/04/12 1314 09/04/12 1800 09/04/12 2147 09/05/12 0618  BP: 121/68 113/67 129/73 103/57  Pulse: 52 77 56 57  Temp: 97.9 F (36.6 C) 98.1 F (36.7 C) 98.5 F (36.9 C) 97.9  F (36.6 C)  TempSrc: Oral Oral Oral Oral  Resp: 16 16 17 19   Height:      Weight:      SpO2: 99% 98% 100% 98%    Wt Readings from Last 3 Encounters:  09/02/12 79.7 kg (175 lb 11.3 oz)  09/02/12 79.7 kg (175 lb 11.3 oz)  09/02/12 79.7 kg (175 lb 11.3 oz)     Intake/Output Summary (Last 24 hours) at 09/05/12 0858 Last data filed at 09/05/12 0500  Gross per 24 hour  Intake   1430 ml  Output    800 ml  Net    630 ml    Exam Awake Alert, Oriented X 3, No new F.N deficits, Normal affect [place].AT,PERRAL Supple Neck,No JVD, No cervical lymphadenopathy appriciated.  Symmetrical Chest wall movement, Good air movement bilaterally, CTAB RRR,No Gallops,Rubs or new Murmurs, No Parasternal Heave +ve B.Sounds, Abd Soft, Non tender, No organomegaly appriciated, No rebound - guarding or rigidity. No Cyanosis, Clubbing or edema, No new Rash or bruise     Data Review   Micro Results Recent Results  (from the past 240 hour(s))  SURGICAL PCR SCREEN     Status: Abnormal   Collection Time    09/05/12  1:26 AM      Result Value Range Status   MRSA, PCR NEGATIVE  NEGATIVE Final   Staphylococcus aureus POSITIVE (*) NEGATIVE Final   Comment:            The Xpert SA Assay (FDA     approved for NASAL specimens     in patients over 68 years of age),     is one component of     a comprehensive surveillance     program.  Test performance has     been validated by [employer] for patients greater     than or equal to 64 year old.     It is not intended     to diagnose infection nor to     guide or monitor treatment.     RESULT CALLED TO, READ BACK BY AND VERIFIED WITHSharolyn Douglas [id number] [facility]    Radiology Reports US Abdomen Complete  09/02/2012   *RADIOLOGY REPORT*  Clinical Data:  Abdominal pain.  Elevated liver enzymes.  COMPLETE ABDOMINAL ULTRASOUND  Comparison:  CT scan of the abdomen dated 12/28/2008  Findings:  Gallbladder:  There are numerous stones in the gallbladder, the largest being 1.9 cm in diameter.  These stones were present on the prior CT scan.  Gallbladder wall is not thickened.  Negative sonographic Murphy's sign.  Common bile duct:  Common bile duct is dilated to a diameter of 10.2 mm.  There are dilated intrahepatic bile ducts.  Liver:  Intrahepatic ductal dilatation.  The liver parenchyma is otherwise normal.  IVC:  Normal.  Pancreas:  Normal.  Spleen:  Normal.  4.2 cm in length.  Right Kidney:  Normal.  10.0 cm in length.  Left Kidney:  Normal.  11.7 cm in length.  Abdominal aorta:  Normal.  2.2 cm maximum diameter.  IMPRESSION: Multiple gallstones.  Biliary ductal dilatation into the head of the pancreas.  The finding is most likely secondary to a distal common bile duct stone although the stone is not identified in the distal duct on this exam.   Original Report Authenticated By: Francene Boyers, M.D.    CBC  Recent Labs Lab 09/02/12 1504 09/03/12 0405  09/04/12  0440 09/05/12 0450  WBC 9.0 7.5 6.6 9.4  HGB 13.5 11.7* 11.6* 12.2  HCT 39.5 35.8* 34.9* 36.8  PLT 248 237 217 258  MCV 92.3 94.5 93.1 92.5  MCH 31.5 30.9 30.9 30.7  MCHC 34.2 32.7 33.2 33.2  RDW 13.3 13.5 13.2 12.9  LYMPHSABS 1.6  --   --   --   MONOABS 0.7  --   --   --   EOSABS 0.1  --   --   --   BASOSABS 0.0  --   --   --     Chemistries   Recent Labs Lab 09/02/12 1504 09/03/12 0405 09/04/12 0440 09/05/12 0450  NA 138 134* 135 136  K 3.3* 3.9 4.1 3.5  CL 101 104 102 100  CO2 25 27 28 30   GLUCOSE 123* 90 90 100*  BUN 9 4* 3* 4*  CREATININE 0.55 0.56 0.64 0.66  CALCIUM 9.0 7.9* 8.4 9.1  AST 814*  --  131* 59*  ALT 501*  --  398* 318*  ALKPHOS 102  --  133* 140*  BILITOT 1.4*  --  2.6* 1.4*   ------------------------------------------------------------------------------------------------------------------ estimated creatinine clearance is 107.9 ml/min (by C-G formula based on Cr of 0.66). ------------------------------------------------------------------------------------------------------------------ No results found for this basename: HGBA1C,  in the last 72 hours ------------------------------------------------------------------------------------------------------------------ No results found for this basename: CHOL, HDL, LDLCALC, TRIG, CHOLHDL, LDLDIRECT,  in the last 72 hours ------------------------------------------------------------------------------------------------------------------ No results found for this basename: TSH, T4TOTAL, FREET3, T3FREE, THYROIDAB,  in the last 72 hours ------------------------------------------------------------------------------------------------------------------ No results found for this basename: VITAMINB12, FOLATE, FERRITIN, TIBC, IRON, RETICCTPCT,  in the last 72 hours  Coagulation profile No results found for this basename: INR, PROTIME,  in the last 168 hours  No results found for this basename: DDIMER,  in  the last 72 hours  Cardiac Enzymes No results found for this basename: CK, CKMB, TROPONINI, MYOGLOBIN,  in the last 168 hours ------------------------------------------------------------------------------------------------------------------ No components found with this basename: POCBNP,

## 2012-09-05 NOTE — Op Note (Signed)
Preoperative diagnosis: Choledocholithiasis status post ERCP Postoperative diagnosis: Same as above Procedure: Laparoscopic cholecystectomy Surgeon: Dr. Harden Mo Asst.: Dr. Axel Filler Anesthesia: Gen. Estimated blood loss: Minimal Drains: None Specimens: Gallbladder and contents to pathology Complications: None Sponge and needle count correct at operation Disposition to recovery stable  Indications: This a 27 year old female who developed abdominal pain was found to have gallstones. She also had elevated liver function tests and underwent an ERCP with clearance of her duct. I took her the following day to the operating room for laparoscopic cholecystectomy after discussion of the risks and benefits associated with the operation.  Procedure: After informed associated the patient was taken to the operating room. She was given antibiotics on the floor. She had sequential compression devices on her legs. She was then placed under general anesthesia without complication. Her abdomen was prepped and draped in the standard sterile surgical fashion. A surgical timeout was performed.  I injected Marcaine below her umbilicus. I made a vertical incision. I grasped her fascia with Kocher clamps. I entered the fascia sharply and the peritoneum bluntly. I placed a 0 Vicryl pursestring suture to the fascia. I then introduced the [place] trocar and insufflated the abdomen to 15 mm mercury pressure. I then inserted 3 further 5 mm trocars in the epigastrium and right upper quadrant under direct vision without complication. The gallbladder was then retracted cephalad and lateral. A critical view was obtained. I did not do a cholangiogram due to the fact that she had an ERCP yesterday. I then clipped the duct 3 times and divided it. I clipped the anterior and posterior branches of the artery and divided them as well. Then I removed the gallbladder from the liver bed without difficulty. This was placed in Endo  Catch bag and removed from the umbilicus. Hemostasis was observed. Irrigation was performed and this was clear. I then removed my Hassan trocar. I tied my umbilical stitch down and this completely obliterated the defect. I viewed this from my epigastric port there was no evidence of an entry injury. I then removed the remainder of our ports and desufflated the abdomen. These were closed with 4-0 Monocryl and Dermabond. At the completion we did place a catheter due to the fact she was evidence of difficulty urinating. There was only about 75 cc out of this and we removed this prior to her waking up. She was then extubated and transferred to recovery stable.

## 2012-09-05 NOTE — Progress Notes (Signed)
Agree with above, will check ua but will also place foley for 24 hours around surgery due to difficulty emptying. Plan lap chole today, she has minimal pain and no real evidence post ercp pancreatitis.

## 2012-09-05 NOTE — Progress Notes (Signed)
1 Day Post-Op  Subjective: Pt had abdominal pain last night and early this morning, doing better now with pain medication.  Denies further fever, chills or sweats.  Complains of urinary hesitancy and incomplete emptying.  Denies hematuria.  Objective: Vital signs in last 24 hours: Temp:  [97.4 F (36.3 C)-98.5 F (36.9 C)] 97.9 F (36.6 C) (07/08 0618) Pulse Rate:  [50-86] 57 (07/08 0618) Resp:  [13-20] 19 (07/08 0618) BP: (103-129)/(54-75) 103/57 mmHg (07/08 0618) SpO2:  [96 %-100 %] 98 % (07/08 0618) Last BM Date: 09/02/12  Intake/Output from previous day: 07/07 0701 - 07/08 0700 In: 1430 [P.O.:480; I.V.:700; IV Piggyback:250] Out: 800 [Urine:800] Intake/Output this shift:    General appearance: alert, cooperative, appears older than stated age and no distress Resp: clear to auscultation bilaterally, no cyanosis, no chest wall tenderness.   Cardio: regular rate and rhythm, S1, S2 normal, no murmur, click, rub or gallop.  +2 pulses, no edema. GI: soft, mild diffuse tenderness, bowel sounds normal; no masses,  no organomegaly Extremities: extremities normal, atraumatic, no cyanosis or edema.  SCDs  Lab Results:   Recent Labs  09/04/12 0440 09/05/12 0450  WBC 6.6 9.4  HGB 11.6* 12.2  HCT 34.9* 36.8  PLT 217 258   BMET  Recent Labs  09/04/12 0440 09/05/12 0450  NA 135 136  K 4.1 3.5  CL 102 100  CO2 28 30  GLUCOSE 90 100*  BUN 3* 4*  CREATININE 0.64 0.66  CALCIUM 8.4 9.1    Lab Results  Component Value Date   ALT 318* 09/05/2012   AST 59* 09/05/2012   ALKPHOS 140* 09/05/2012   BILITOT 1.4* 09/05/2012     Studies/Results: Dg Ercp Biliary & Pancreatic Ducts  09/04/2012   *RADIOLOGY REPORT*  Clinical Data: Common bile duct stones.  ERCP  Comparison: Ultrasound dated 09/02/2012  Findings: Images from ERCP demonstrate a wire in the biliary tree. Sphincterotomy was performed.  Balloon is seen sweeping the bile duct.  The last image demonstrates no stones.   IMPRESSION: ERCP and sphincterotomy performed.   Original Report Authenticated By: Francene Boyers, M.D.    Anti-infectives: Anti-infectives   Start     Dose/Rate Route Frequency Ordered Stop   09/03/12 1000  piperacillin-tazobactam (ZOSYN) IVPB 3.375 g     3.375 g 12.5 mL/hr over 240 Minutes Intravenous Every 8 hours 09/03/12 0845        Assessment/Plan: Cholelithiasis with CBD obstruction  Elevated transaminase  -ERCP(7/7) sphincterotomy/papillotomy and ERCP with  removal of calculus/calculi -Lap chole this morning, consent signed, on antibiotics, not on antithrombotic therapy. -LFTs are trending down, repeat LFTs in AM  -pain control  -continue with zosyn -start lovenox following surgery if okay with GI -SCDs, ambulate  Dysuria -obtain UA   LOS: 3 days   Bonner Puna [facility] ANP-BC Pager [phone]  09/05/2012 7:57 AM

## 2012-09-05 NOTE — Transfer of Care (Signed)
Immediate Anesthesia Transfer of Care Note  Patient: [name]  Procedure(s) Performed: Procedure(s): LAPAROSCOPIC CHOLECYSTECTOMY (N/A)  Patient Location: PACU  Anesthesia Type:General  Level of Consciousness: awake  Airway & Oxygen Therapy: Patient Spontanous Breathing and Patient connected to nasal cannula oxygen  Post-op Assessment: Report given to PACU RN, Post -op Vital signs reviewed and stable and Patient moving all extremities  Post vital signs: Reviewed and stable  Complications: No apparent anesthesia complications

## 2012-09-05 NOTE — Anesthesia Procedure Notes (Signed)
Procedure Name: Intubation Date/Time: 09/05/2012 10:46 AM Performed by: Luster Landsberg Pre-anesthesia Checklist: Patient identified, Emergency Drugs available, Suction available and Patient being monitored Patient Re-evaluated:Patient Re-evaluated prior to inductionOxygen Delivery Method: Circle system utilized Preoxygenation: Pre-oxygenation with 100% oxygen Intubation Type: IV induction Ventilation: Mask ventilation without difficulty and Oral airway inserted - appropriate to patient size Laryngoscope Size: Mac and 3 Grade View: Grade I Tube type: Oral Tube size: 7.5 mm Number of attempts: 1 Airway Equipment and Method: Stylet Placement Confirmation: ETT inserted through vocal cords under direct vision and breath sounds checked- equal and bilateral Secured at: 22 cm Tube secured with: Tape Dental Injury: Teeth and Oropharynx as per pre-operative assessment

## 2012-09-06 LAB — CBC
MCH: 30.8 pg (ref 26.0–34.0)
MCHC: 33 g/dL (ref 30.0–36.0)
MCV: 93.2 fL (ref 78.0–100.0)
Platelets: 240 10*3/uL (ref 150–400)
RDW: 13.2 % (ref 11.5–15.5)

## 2012-09-06 LAB — COMPREHENSIVE METABOLIC PANEL
AST: 34 U/L (ref 0–37)
Albumin: 3.1 g/dL — ABNORMAL LOW (ref 3.5–5.2)
Calcium: 8.9 mg/dL (ref 8.4–10.5)
Creatinine, Ser: 0.75 mg/dL (ref 0.50–1.10)
GFR calc non Af Amer: >90 mL/min (ref >90)
Sodium: 138 mEq/L (ref 135–145)
Total Protein: 5.9 g/dL — ABNORMAL LOW (ref 6.0–8.3)

## 2012-09-06 MED ORDER — HYDROMORPHONE HCL PF 1 MG/ML IJ SOLN: 0.5000 mg | INTRAMUSCULAR | Status: DC | PRN

## 2012-09-06 MED ORDER — TRAMADOL HCL 50 MG PO TABS
50.0000 mg | ORAL_TABLET | Freq: Three times a day (TID) | ORAL | Status: DC
  Administered 2012-09-06 – 2012-09-07 (×4): 50 mg via ORAL
  Filled 2012-09-06 (×7): qty 1

## 2012-09-06 MED ORDER — POLYETHYLENE GLYCOL 3350 17 G PO PACK
17.0000 g | PACK | Freq: Every day | ORAL | Status: DC
  Administered 2012-09-06: 17 g via ORAL
  Filled 2012-09-06 (×2): qty 1

## 2012-09-06 MED ORDER — ENOXAPARIN SODIUM 40 MG/0.4ML ~~LOC~~ SOLN
40.0000 mg | SUBCUTANEOUS | Status: DC
  Administered 2012-09-06: 40 mg via SUBCUTANEOUS
  Filled 2012-09-06 (×2): qty 0.4

## 2012-09-06 NOTE — Progress Notes (Signed)
[facility] PROGRESS NOTE  [name] ZOX:[id number] DOB: [birth year] DOA: 09/02/2012 PCP: Pcp Not In System  Assessment/Plan: Right upper quadrant abdominal pain with nausea vomiting secondary to Cholelithiasis with obstruction, elevated transaminases. underwent ERCP by GI on 09/04/2012, general surgery: laparoscopic cholecystectomy on 09/05/2012  Still having some pain- wean off dilaudid  Nausea vomiting causing hyperkalemia due to #1 above. Stable now, supportive care, potassium repleted and stable.   H/O depression home dose Zoloft to continue once taking by mouth.   Code Status: full Family Communication: patient at bedside Disposition Plan: home in AM   Consultants:  GI  surgery  Procedures:  ERCP 7/7  Lap chole 7/8  Antibiotics:    HPI/Subjective: Took mediations to help with BM today eating well  Objective: Filed Vitals:   09/05/12 2200 09/06/12 0200 09/06/12 0600 09/06/12 0947  BP: 106/53 117/52 119/59 116/52  Pulse: 56 59 65 65  Temp: 98.3 F (36.8 C) 98.4 F (36.9 C) 97.9 F (36.6 C) 97.9 F (36.6 C)  TempSrc: Oral Oral Axillary Oral  Resp:    18  Height:      Weight:      SpO2: 100% 96% 97% 100%    Intake/Output Summary (Last 24 hours) at 09/06/12 1042 Last data filed at 09/06/12 0534  Gross per 24 hour  Intake   2930 ml  Output   2585 ml  Net    345 ml   Filed Weights   09/02/12 2124  Weight: 79.7 kg (175 lb 11.3 oz)    Exam:   General:  A+Ox3, NAD  Cardiovascular: rrr  Respiratory: clear anterior  Abdomen: +BS, soft, NT  Musculoskeletal: moves all 4 ext   Data Reviewed: Basic Metabolic Panel:  Recent Labs Lab 09/02/12 1504 09/03/12 0405 09/04/12 0440 09/05/12 0450 09/06/12 0633  NA 138 134* 135 136 138  K 3.3* 3.9 4.1 3.5 3.7  CL 101 104 102 100 102  CO2 25 27 28 30 29   GLUCOSE 123* 90 90 100* 111*  BUN 9 4* 3* 4* 5*  CREATININE 0.55 0.56 0.64 0.66 0.75  CALCIUM 9.0 7.9* 8.4 9.1 8.9   Liver  Function Tests:  Recent Labs Lab 09/02/12 1504 09/04/12 0440 09/05/12 0450 09/06/12 0633  AST 814* 131* 59* 34  ALT 501* 398* 318* 215*  ALKPHOS 102 133* 140* 112  BILITOT 1.4* 2.6* 1.4* 0.7  PROT 7.0 6.0 6.4 5.9*  ALBUMIN 3.8 3.2* 3.3* 3.1*    Recent Labs Lab 09/02/12 1504  LIPASE 40   No results found for this basename: AMMONIA,  in the last 168 hours CBC:  Recent Labs Lab 09/02/12 1504 09/03/12 0405 09/04/12 0440 09/05/12 0450 09/06/12 0633  WBC 9.0 7.5 6.6 9.4 9.2  NEUTROABS 6.6  --   --   --   --   HGB 13.5 11.7* 11.6* 12.2 11.4*  HCT 39.5 35.8* 34.9* 36.8 34.5*  MCV 92.3 94.5 93.1 92.5 93.2  PLT 248 237 217 258 240   Cardiac Enzymes: No results found for this basename: CKTOTAL, CKMB, CKMBINDEX, TROPONINI,  in the last 168 hours BNP (last 3 results) No results found for this basename: PROBNP,  in the last 8760 hours CBG: No results found for this basename: GLUCAP,  in the last 168 hours  Recent Results (from the past 240 hour(s))  SURGICAL PCR SCREEN     Status: Abnormal   Collection Time    09/05/12  1:26 AM      Result Value Range  Status   MRSA, PCR NEGATIVE  NEGATIVE Final   Staphylococcus aureus POSITIVE (*) NEGATIVE Final   Comment:            The Xpert SA Assay (FDA     approved for NASAL specimens     in patients over 46 years of age),     is one component of     a comprehensive surveillance     program.  Test performance has     been validated by [employer] for patients greater     than or equal to 31 year old.     It is not intended     to diagnose infection nor to     guide or monitor treatment.     RESULT CALLED TO, READ BACK BY AND VERIFIED WITHSharolyn Douglas [id number] WILDERK     Studies: Dg Ercp Biliary & Pancreatic Ducts  09/04/2012   *RADIOLOGY REPORT*  Clinical Data: Common bile duct stones.  ERCP  Comparison: Ultrasound dated 09/02/2012  Findings: Images from ERCP demonstrate a wire in the biliary tree.  Sphincterotomy was performed.  Balloon is seen sweeping the bile duct.  The last image demonstrates no stones.  IMPRESSION: ERCP and sphincterotomy performed.   Original Report Authenticated By: Francene Boyers, M.D.    Scheduled Meds: . Chlorhexidine Gluconate Cloth  6 each Topical Daily  . ibuprofen  600 mg Oral TID  . mupirocin ointment  1 application Nasal BID  . pantoprazole (PROTONIX) IV  40 mg Intravenous Q12H  . polyethylene glycol  17 g Oral Daily  . sertraline  50 mg Oral Daily  . traMADol  50 mg Oral TID   Continuous Infusions:   Principal Problem:   Cholelithiasis with obstruction Active Problems:   Elevated transaminase level   Epigastric abdominal pain   Nausea   Hypokalemia   Nonspecific (abnormal) findings on radiological and other examination of biliary tract   Choledocholithiasis    Time spent: 25    Marlin Canary  [facility] Pager [phone]. If 7PM-7AM, please contact night-coverage at [email or web address], password [facility] 09/06/2012, 10:42 AM  LOS: 4 days

## 2012-09-06 NOTE — Progress Notes (Signed)
Agree with above, will check later to see if ready to dc but likely tomorrow

## 2012-09-06 NOTE — Progress Notes (Signed)
1 Day Post-Op  Subjective: States she had a bad night, complaining of 10/10 pain.  She has been taking ibuprofen, percocet and dilaudid.  The dilaudid is most helpful with the pain.  She tolerated dinner well.  Dysuria has improved.  Denies flatus, bm.  She has walked to the bathroom.  Objective: Vital signs in last 24 hours: Temp:  [97 F (36.1 C)-98.7 F (37.1 C)] 97.9 F (36.6 C) (07/09 0600) Pulse Rate:  [52-68] 65 (07/09 0600) Resp:  [11-18] 17 (07/08 1825) BP: (102-150)/(52-86) 119/59 mmHg (07/09 0600) SpO2:  [96 %-100 %] 97 % (07/09 0600) Last BM Date: 09/02/12  Intake/Output from previous day: 07/08 0701 - 07/09 0700 In: 2930 [P.O.:900; I.V.:1930; IV Piggyback:100] Out: 2885 [Urine:2875; Blood:10] Intake/Output this shift:   PE General appearance: alert, cooperative, appears older than stated age and no distress  Resp: clear to auscultation bilaterally, no cyanosis, no chest wall tenderness.  Cardio: regular rate and rhythm, S1, S2 normal, no murmur, click, rub or gallop. +2 pulses, no edema.  GI: soft, tenderness over the incision sites, dermabond in place, minimal erythema surrounding the incision which is likely reaction to the dermabond given that it is present in all 3 sites.  bowel sounds normal; no masses, no organomegaly  Extremities: extremities normal, atraumatic, no cyanosis or edema. SCDs    Lab Results:   Recent Labs  09/05/12 0450 09/06/12 0633  WBC 9.4 9.2  HGB 12.2 11.4*  HCT 36.8 34.5*  PLT 258 240   BMET  Recent Labs  09/05/12 0450 09/06/12 0633  NA 136 138  K 3.5 3.7  CL 100 102  CO2 30 29  GLUCOSE 100* 111*  BUN 4* 5*  CREATININE 0.66 0.75  CALCIUM 9.1 8.9   Lab Results  Component Value Date   ALT 215* 09/06/2012   AST 34 09/06/2012   ALKPHOS 112 09/06/2012   BILITOT 0.7 09/06/2012    Studies/Results: Dg Ercp Biliary & Pancreatic Ducts  09/04/2012   *RADIOLOGY REPORT*  Clinical Data: Common bile duct stones.  ERCP  Comparison:  Ultrasound dated 09/02/2012  Findings: Images from ERCP demonstrate a wire in the biliary tree. Sphincterotomy was performed.  Balloon is seen sweeping the bile duct.  The last image demonstrates no stones.  IMPRESSION: ERCP and sphincterotomy performed.   Original Report Authenticated By: Francene Boyers, M.D.    Anti-infectives: Anti-infectives   Start     Dose/Rate Route Frequency Ordered Stop   09/05/12 1800  piperacillin-tazobactam (ZOSYN) IVPB 3.375 g     3.375 g 12.5 mL/hr over 240 Minutes Intravenous Every 8 hours 09/05/12 1319 09/06/12 1759   09/03/12 1000  piperacillin-tazobactam (ZOSYN) IVPB 3.375 g  Status:  Discontinued     3.375 g 12.5 mL/hr over 240 Minutes Intravenous Every 8 hours 09/03/12 0845 09/05/12 1319      Assessment/Plan: Cholelithiasis with CBD obstruction  Elevated transaminase  -ERCP(7/7) sphincterotomy/papillotomy and ERCP with  removal of calculus/calculi  -Laparoscopic cholecystectomy(09/05/12 Dr. Dwain Sarna) -pain control, add tramadol -SCDs, ambulate -stop zosyn -tolerating regular diet -add miralax to prevent constipation -not quite ready for discharge today   LOS: 4 days   Bonner Puna [facility] ANP-Bc Pager [phone]  09/06/2012 8:18 AM

## 2012-09-06 NOTE — Progress Notes (Signed)
Pt doing a little better, using less IV pain medication.  She is ambulating and tolerating regular diet.  Keep overnight and discharge in the morning.  Princessa Lesmeister, ANP-BC

## 2012-09-07 ENCOUNTER — Encounter (HOSPITAL_COMMUNITY): Payer: Self-pay | Admitting: General Surgery

## 2012-09-07 LAB — COMPREHENSIVE METABOLIC PANEL
Albumin: 3.2 g/dL — ABNORMAL LOW (ref 3.5–5.2)
Alkaline Phosphatase: 113 U/L (ref 39–117)
BUN: 5 mg/dL — ABNORMAL LOW (ref 6–23)
CO2: 30 mEq/L (ref 19–32)
Chloride: 98 mEq/L (ref 96–112)
GFR calc non Af Amer: >90 mL/min (ref >90)
Glucose, Bld: 78 mg/dL (ref 70–99)
Potassium: 4 mEq/L (ref 3.5–5.1)
Total Bilirubin: 0.5 mg/dL (ref 0.3–1.2)

## 2012-09-07 MED ORDER — POLYETHYLENE GLYCOL 3350 17 G PO PACK: 17.0000 g | PACK | Freq: Every day | ORAL | Status: AC

## 2012-09-07 MED ORDER — OXYCODONE-ACETAMINOPHEN 5-325 MG PO TABS: 1.0000 | ORAL_TABLET | ORAL | Status: DC | PRN

## 2012-09-07 MED ORDER — IBUPROFEN 600 MG PO TABS: 600.0000 mg | ORAL_TABLET | Freq: Three times a day (TID) | ORAL | Status: DC

## 2012-09-07 MED ORDER — TRAMADOL HCL 50 MG PO TABS: 50.0000 mg | ORAL_TABLET | Freq: Three times a day (TID) | ORAL | Status: DC | PRN

## 2012-09-07 NOTE — Progress Notes (Signed)
2 Days Post-Op  Subjective: Pt doing much better today. Tolerating regular diet, voiding.  Pain is under much better control.  She is ambulating in the hallways.  +flatus, no bm yet.    Objective: Vital signs in last 24 hours: Temp:  [97.9 F (36.6 C)-98.2 F (36.8 C)] 98.2 F (36.8 C) (07/09 2327) Pulse Rate:  [54-65] 54 (07/09 2327) Resp:  [17-18] 18 (07/09 2327) BP: (112-129)/(52-64) 112/64 mmHg (07/09 2327) SpO2:  [96 %-100 %] 96 % (07/09 2327) Last BM Date: 09/04/12  Intake/Output from previous day: 07/09 0701 - 07/10 0700 In: 240 [P.O.:240] Out: -  Intake/Output this shift:   PE  General appearance: alert, cooperative, appears older than stated age and no distress  Resp: clear to auscultation bilaterally, no cyanosis, no chest wall tenderness.  Cardio: regular rate and rhythm, S1, S2 normal, no murmur, click, rub or gallop. +2 pulses, no edema.  GI: soft, tenderness over the incision sites, dermabond in place, resolved erythema.  bowel sounds normal; no masses, no organomegaly  Extremities: extremities normal, atraumatic, no cyanosis or edema. SCDs   Lab Results:   Recent Labs  09/05/12 0450 09/06/12 0633  WBC 9.4 9.2  HGB 12.2 11.4*  HCT 36.8 34.5*  PLT 258 240   BMET  Recent Labs  09/06/12 0633 09/07/12 0525  NA 138 138  K 3.7 4.0  CL 102 98  CO2 29 30  GLUCOSE 111* 78  BUN 5* 5*  CREATININE 0.75 0.74  CALCIUM 8.9 9.1   Lab Results  Component Value Date   ALT 200* 09/07/2012   AST 56* 09/07/2012   ALKPHOS 113 09/07/2012   BILITOT 0.5 09/07/2012     Anti-infectives: Anti-infectives   Start     Dose/Rate Route Frequency Ordered Stop   09/05/12 1800  piperacillin-tazobactam (ZOSYN) IVPB 3.375 g  Status:  Discontinued     3.375 g 12.5 mL/hr over 240 Minutes Intravenous Every 8 hours 09/05/12 1319 09/06/12 0817   09/03/12 1000  piperacillin-tazobactam (ZOSYN) IVPB 3.375 g  Status:  Discontinued     3.375 g 12.5 mL/hr over 240 Minutes Intravenous  Every 8 hours 09/03/12 0845 09/05/12 1319      Assessment/Plan: Cholelithiasis with CBD obstruction  Elevated transaminase  -ERCP(7/7) sphincterotomy/papillotomy and ERCP with  removal of calculus/calculi  -Laparoscopic cholecystectomy(09/05/12 Dr. Dwain Sarna -stable for discharge. Discussed with Dr. Zenaida Niece.  Prescriptions written, follow up scheduled and work statement provided.  Medication risks, benefits and therapeutic alternatives reviewed with the patient.  She will take miralax to help prevent constipation associated with pain medication.  Self care measures reviewed.  Warning signs that warrant immediate attention reviewed.  She was encouraged to call [facility] clinic with any questions or concerns.     LOS: 5 days    Bonner Puna [facility] ANP-BC Pager [phone]  09/07/2012 9:07 AM

## 2012-09-07 NOTE — Discharge Summary (Signed)
Physician Discharge Summary  [name]:[id number] DOB: 1985-08-28 DOA: 09/02/2012  PCP: Ihor Gully  Admit date: 09/02/2012 Discharge date: 09/07/2012  Time spent: 35 minutes   Discharge Diagnoses:  Principal Problem:   Cholelithiasis with obstruction Active Problems:   Elevated transaminase level   Epigastric abdominal pain   Nausea   Hypokalemia   Nonspecific (abnormal) findings on radiological and other examination of biliary tract   Choledocholithiasis   Discharge Condition: improved  Diet recommendation: low fat  Filed Weights   09/02/12 2124  Weight: 79.7 kg (175 lb 11.3 oz)    History of present illness:  [name] is a 27 y.o. female who was in her usual state of good health until this AM when she began to have severe 9/10 epigastric ABD Pain. The pain was constant and sharp. She had nausea but no vomiting. She denies having fevers or chills or diarrhea. In the ED she was evaluated and was found to have elevated transaminases and an ABD [place] study revealed +Gallstones and CBD dilatation. She was referred for medical admission and Gi was consulted to evaluate and perform an ERCP if possible in the AM. Dr. Rhea Belton of [facility] was consulted.    Hospital Course:  Right upper quadrant abdominal pain with nausea vomiting secondary to Cholelithiasis with obstruction, elevated transaminases. underwent ERCP by GI on 09/04/2012, general surgery: laparoscopic cholecystectomy on 09/05/2012   Nausea vomiting causing hyperkalemia due to #1 above. Stable now, supportive care, potassium repleted and stable.   H/O depression home dose Zoloft to continue once taking by mouth   Procedures: ERCP 7/7  Lap chole 7/8   Consultations:  surgery  Discharge Exam: Filed Vitals:   09/06/12 0600 09/06/12 0947 09/06/12 1407 09/06/12 2327  BP: 119/59 116/52 129/64 112/64  Pulse: 65 65 56 54  Temp: 97.9 F (36.6 C) 97.9 F (36.6 C) 97.9 F (36.6 C) 98.2 F  (36.8 C)  TempSrc: Axillary Oral Oral Oral  Resp:  18 17 18   Height:      Weight:      SpO2: 97% 100% 100% 96%    General: A+Ox3, NAD Cardiovascular: rrr Respiratory: clear anterior  Discharge Instructions      Discharge Orders   Future Appointments Provider Department Dept Phone   09/26/2012 11:45 AM [facility] [place] [phone]   Future Orders Complete By Expires     Call MD for:  persistant nausea and vomiting  As directed     Call MD for:  redness, tenderness, or signs of infection (pain, swelling, redness, odor or green/yellow discharge around incision site)  As directed     Call MD for:  severe uncontrolled pain  As directed     Call MD for:  temperature >100.4  As directed     Discharge instructions  As directed     Comments:      Low fat diet Further instructions per surgery    Increase activity slowly  As directed         Medication List    STOP taking these medications       metroNIDAZOLE 250 MG tablet  Commonly known as:  FLAGYL      TAKE these medications       etonogestrel 68 MG Impl implant  Commonly known as:  IMPLANON  Inject 1 each into the skin once.     ibuprofen 600 MG tablet  Commonly known as:  ADVIL,MOTRIN  Take 1 tablet (600 mg total) by mouth 3 (three) times daily.     oxyCODONE-acetaminophen 5-325 MG per tablet  Commonly known as:  PERCOCET/ROXICET  Take 1 tablet by mouth every 4 (four) hours as needed.     polyethylene glycol packet  Commonly known as:  MIRALAX / GLYCOLAX  Take 17 g by mouth daily.     sertraline 50 MG tablet  Commonly known as:  ZOLOFT  Take 50 mg by mouth daily.     traMADol 50 MG tablet  Commonly known as:  ULTRAM  Take 1 tablet (50 mg total) by mouth every 8 (eight) hours as needed for pain.       No Known Allergies Follow-up Information   Follow up with [facility] On 09/26/2012. (APPOINTMENT TIME: 11:45AM.  PLEASE ARRIVE PRIOR TO YOUR  APPOINTMENT)    Contact information:   [address]   [place] [address] [phone]       Follow up with Powers, Shireen Quan In 1 week.   Contact information:   [address] [place] [address] [phone]        The results of significant diagnostics from this hospitalization (including imaging, microbiology, ancillary and laboratory) are listed below for reference.    Significant Diagnostic Studies: US Abdomen Complete  09/02/2012   *RADIOLOGY REPORT*  Clinical Data:  Abdominal pain.  Elevated liver enzymes.  COMPLETE ABDOMINAL ULTRASOUND  Comparison:  CT scan of the abdomen dated 12/28/2008  Findings:  Gallbladder:  There are numerous stones in the gallbladder, the largest being 1.9 cm in diameter.  These stones were present on the prior CT scan.  Gallbladder wall is not thickened.  Negative sonographic Murphy's sign.  Common bile duct:  Common bile duct is dilated to a diameter of 10.2 mm.  There are dilated intrahepatic bile ducts.  Liver:  Intrahepatic ductal dilatation.  The liver parenchyma is otherwise normal.  IVC:  Normal.  Pancreas:  Normal.  Spleen:  Normal.  4.2 cm in length.  Right Kidney:  Normal.  10.0 cm in length.  Left Kidney:  Normal.  11.7 cm in length.  Abdominal aorta:  Normal.  2.2 cm maximum diameter.  IMPRESSION: Multiple gallstones.  Biliary ductal dilatation into the head of the pancreas.  The finding is most likely secondary to a distal common bile duct stone although the stone is not identified in the distal duct on this exam.   Original Report Authenticated By: Francene Boyers, M.D.   Dg Ercp Biliary & Pancreatic Ducts  09/04/2012   *RADIOLOGY REPORT*  Clinical Data: Common bile duct stones.  ERCP  Comparison: Ultrasound dated 09/02/2012  Findings: Images from ERCP demonstrate a wire in the biliary tree. Sphincterotomy was performed.  Balloon is seen sweeping the bile duct.  The last image demonstrates no stones.  IMPRESSION: ERCP and  sphincterotomy performed.   Original Report Authenticated By: Francene Boyers, M.D.    Microbiology: Recent Results (from the past 240 hour(s))  SURGICAL PCR SCREEN     Status: Abnormal   Collection Time    09/05/12  1:26 AM      Result Value Range Status   MRSA, PCR NEGATIVE  NEGATIVE Final   Staphylococcus aureus POSITIVE (*) NEGATIVE Final   Comment:            The Xpert SA Assay (FDA     approved for NASAL specimens     in patients over  71 years of age),     is one component of     a comprehensive surveillance     program.  Test performance has     been validated by [employer] for patients greater     than or equal to 30 year old.     It is not intended     to diagnose infection nor to     guide or monitor treatment.     RESULT CALLED TO, READ BACK BY AND VERIFIED WITHSharolyn Douglas [id number] [facility]     Labs: Basic Metabolic Panel:  Recent Labs Lab 09/03/12 0405 09/04/12 0440 09/05/12 0450 09/06/12 0633 09/07/12 0525  NA 134* 135 136 138 138  K 3.9 4.1 3.5 3.7 4.0  CL 104 102 100 102 98  CO2 27 28 30 29 30   GLUCOSE 90 90 100* 111* 78  BUN 4* 3* 4* 5* 5*  CREATININE 0.56 0.64 0.66 0.75 0.74  CALCIUM 7.9* 8.4 9.1 8.9 9.1   Liver Function Tests:  Recent Labs Lab 09/02/12 1504 09/04/12 0440 09/05/12 0450 09/06/12 0633 09/07/12 0525  AST 814* 131* 59* 34 56*  ALT 501* 398* 318* 215* 200*  ALKPHOS 102 133* 140* 112 113  BILITOT 1.4* 2.6* 1.4* 0.7 0.5  PROT 7.0 6.0 6.4 5.9* 6.3  ALBUMIN 3.8 3.2* 3.3* 3.1* 3.2*    Recent Labs Lab 09/02/12 1504  LIPASE 40   No results found for this basename: AMMONIA,  in the last 168 hours CBC:  Recent Labs Lab 09/02/12 1504 09/03/12 0405 09/04/12 0440 09/05/12 0450 09/06/12 0633  WBC 9.0 7.5 6.6 9.4 9.2  NEUTROABS 6.6  --   --   --   --   HGB 13.5 11.7* 11.6* 12.2 11.4*  HCT 39.5 35.8* 34.9* 36.8 34.5*  MCV 92.3 94.5 93.1 92.5 93.2  PLT 248 237 217 258 240   Cardiac Enzymes: No results  found for this basename: CKTOTAL, CKMB, CKMBINDEX, TROPONINI,  in the last 168 hours BNP: BNP (last 3 results) No results found for this basename: PROBNP,  in the last 8760 hours CBG: No results found for this basename: GLUCAP,  in the last 168 hours     Signed:  Benjamine Mola, Hiep Ollis  [facility] 09/07/2012, 1:15 PM

## 2012-09-07 NOTE — Progress Notes (Signed)
Agree with above 

## 2012-09-07 NOTE — Progress Notes (Signed)
DC home with mom, verbally understood dc instructions, no questions ask

## 2012-09-15 ENCOUNTER — Other Ambulatory Visit (INDEPENDENT_AMBULATORY_CARE_PROVIDER_SITE_OTHER): Payer: Self-pay | Admitting: General Surgery

## 2012-09-18 ENCOUNTER — Telehealth (INDEPENDENT_AMBULATORY_CARE_PROVIDER_SITE_OTHER): Payer: Self-pay | Admitting: General Surgery

## 2012-09-18 MED ORDER — HYDROCODONE-ACETAMINOPHEN 5-325 MG PO TABS: 1.0000 | ORAL_TABLET | Freq: Four times a day (QID) | ORAL | Status: DC | PRN

## 2012-09-18 NOTE — Telephone Encounter (Signed)
This is a patient of MW lap chole on 7/8.Marland Kitchenplease advise refill

## 2012-09-18 NOTE — Telephone Encounter (Signed)
Patient called to get a refill on her pain medication. Per automatic refill protocol Norco 5/325 #30 with no refills called to [facility]. Patient aware. Will keep follow up appt and call as needed.

## 2012-09-26 ENCOUNTER — Ambulatory Visit (INDEPENDENT_AMBULATORY_CARE_PROVIDER_SITE_OTHER): Payer: Medicaid Other | Admitting: Internal Medicine

## 2012-09-26 ENCOUNTER — Encounter (INDEPENDENT_AMBULATORY_CARE_PROVIDER_SITE_OTHER): Payer: Self-pay | Admitting: Internal Medicine

## 2012-09-26 VITALS — BP 114/72 | HR 68 | Temp 98.4°F | Resp 16 | Ht 63.0 in | Wt 169.0 lb

## 2012-09-26 DIAGNOSIS — K8050 Calculus of bile duct without cholangitis or cholecystitis without obstruction: Secondary | ICD-10-CM

## 2012-09-26 NOTE — Patient Instructions (Addendum)
May resume regular activity without restrictions. Follow up as needed. Call with questions or concerns.  

## 2012-09-26 NOTE — Progress Notes (Signed)
  Subjective: Pt returns to the clinic today after undergoing laparoscopic cholecystectomy on 09/05/12 by Dr. Dwain Sarna.  The patient is tolerating their diet well and is having no severe pain.  Bowel function is good.  No problems with the wounds.  Objective: Vital signs in last 24 hours: Reviewed  PE: Abd: soft, non-tender, +bs, incisions well healed  Lab Results:  No results found for this basename: WBC, HGB, HCT, PLT,  in the last 72 hours BMET No results found for this basename: NA, K, CL, CO2, GLUCOSE, BUN, CREATININE, CALCIUM,  in the last 72 hours PT/INR No results found for this basename: LABPROT, INR,  in the last 72 hours CMP     Component Value Date/Time   NA 138 09/07/2012 0525   K 4.0 09/07/2012 0525   CL 98 09/07/2012 0525   CO2 30 09/07/2012 0525   GLUCOSE 78 09/07/2012 0525   BUN 5* 09/07/2012 0525   CREATININE 0.74 09/07/2012 0525   CALCIUM 9.1 09/07/2012 0525   PROT 6.3 09/07/2012 0525   ALBUMIN 3.2* 09/07/2012 0525   AST 56* 09/07/2012 0525   ALT 200* 09/07/2012 0525   ALKPHOS 113 09/07/2012 0525   BILITOT 0.5 09/07/2012 0525   GFRNONAA >90 09/07/2012 0525   GFRAA >90 09/07/2012 0525   Lipase     Component Value Date/Time   LIPASE 40 09/02/2012 1504       Studies/Results: No results found.  Anti-infectives: Anti-infectives   None       Assessment/Plan  1.  S/P Laparoscopic Cholecystectomy: doing well, may resume regular activity without restrictions, Pt will follow up with [place] PRN and knows to call with questions or concerns.     Teva Bronkema 09/26/2012

## 2012-10-02 ENCOUNTER — Ambulatory Visit: Payer: Self-pay | Admitting: Adult Health

## 2013-01-14 ENCOUNTER — Other Ambulatory Visit: Payer: Self-pay | Admitting: Obstetrics and Gynecology

## 2013-01-17 ENCOUNTER — Ambulatory Visit (INDEPENDENT_AMBULATORY_CARE_PROVIDER_SITE_OTHER): Payer: Medicaid Other | Admitting: Adult Health

## 2013-01-17 ENCOUNTER — Encounter: Payer: Self-pay | Admitting: Adult Health

## 2013-01-17 VITALS — BP 114/70 | Ht 64.0 in | Wt 189.0 lb

## 2013-01-17 DIAGNOSIS — Z3202 Encounter for pregnancy test, result negative: Secondary | ICD-10-CM

## 2013-01-17 DIAGNOSIS — Z309 Encounter for contraceptive management, unspecified: Secondary | ICD-10-CM

## 2013-01-17 DIAGNOSIS — N926 Irregular menstruation, unspecified: Secondary | ICD-10-CM

## 2013-01-17 DIAGNOSIS — Z3046 Encounter for surveillance of implantable subdermal contraceptive: Secondary | ICD-10-CM

## 2013-01-17 HISTORY — DX: Irregular menstruation, unspecified: N92.6

## 2013-01-17 LAB — POCT URINE PREGNANCY: Preg Test, Ur: NEGATIVE

## 2013-01-17 MED ORDER — NORGESTIMATE-ETH ESTRADIOL 0.25-35 MG-MCG PO TABS: 1.0000 | ORAL_TABLET | Freq: Every day | ORAL | Status: DC

## 2013-01-17 NOTE — Progress Notes (Signed)
Subjective:     Patient ID: [name], female   DOB: 1985/11/22, 27 y.o.   MRN: [id number]  HPI [name] is a 27 year old black female in for implanon removal and complains of irregular bleeding and vaginal discharge with odor.No new sex partners.  Review of Systems See HPI Reviewed past medical,surgical, social and family history. Reviewed medications and allergies.     Objective:   Physical Exam BP 114/70  Ht 5\' 4"  (1.626 m)  Wt 189 lb (85.73 kg)  BMI 32.43 kg/m2  LMP 01/16/2013   urine pregnancy test negative.   Skin warm and dry.Pelvic: external genitalia is normal in appearance, vagina: period like blood without odor, cervix:smooth and bulbous, uterus: normal size, shape and contour, non tender, no masses felt, adnexa: no masses or tenderness noted. GC/CHL obtained.Verbal consent obtained to remove implanon.   Left arm cleansed with betadine, and injected with 1.5 cc 2% lidocaine and waited til numb.Under sterile technique a #11 blade was used to make small vertical incision, and a curved forceps was used to easily remove rod. Steri strips applied. Pressure dressing applied. She wants to go on the pill.  Assessment:     Implanon removal Contraceptive management Irregular bleeding     Plan:     Check GC/CHL,call in am for results Rx sprintec disp 1 pack take 1 daily refill x 11, start today   Use condoms x 4 weeks, keep clean and dry x 24 hours, no heavy lifting, keep steri strips on x 72 hours, Keep pressure dressing on x 24 hours. Follow up in 3 months Review handout on oral contraceptives

## 2013-01-17 NOTE — Patient Instructions (Addendum)
Start sprintec today Use condoms Follow up in 3 months Oral Contraception Use Oral contraceptive pills (OCPs) are medicines taken to prevent pregnancy. OCPs work by preventing the ovaries from releasing eggs. The hormones in OCPs also cause the cervical mucus to thicken, preventing the sperm from entering the uterus. The hormones also cause the uterine lining to become thin, not allowing a fertilized egg to attach to the inside of the uterus. OCPs are highly effective when taken exactly as prescribed. However, OCPs do not prevent sexually transmitted diseases (STDs). Safe sex practices, such as using condoms along with an OCP, can help prevent STDs. Before taking OCPs, you may have a physical exam and Pap test. Your health care provider may also order blood tests if necessary. Your health care provider will make sure you are a good candidate for oral contraception. Discuss with your health care provider the possible side effects of the OCP you may be prescribed. When starting an OCP, it can take 2 to 3 months for the body to adjust to the changes in hormone levels in your body.  HOW TO TAKE ORAL CONTRACEPTIVE PILLS Your health care provider may advise you on how to start taking the first cycle of OCPs. Otherwise, you can:   Start on day 1 of your menstrual period. You will not need any backup contraceptive protection with this start time.   Start on the first Sunday after your menstrual period or the day you get your prescription. In these cases, you will need to use backup contraceptive protection for the first week.   Start the pill at any time of your cycle. If you take the pill within 5 days of the start of your period, you are protected against pregnancy right away. In this case, you will not need a backup form of birth control. If you start at any other time of your menstrual cycle, you will need to use another form of birth control for 7 days. If your OCP is the type called a minipill, it will  protect you from pregnancy after taking it for 2 days (48 hours). After you have started taking OCPs:   If you forget to take 1 pill, take it as soon as you remember. Take the next pill at the regular time.   If you miss 2 or more pills, call your health care provider because different pills have different instructions for missed doses. Use backup birth control until your next menstrual period starts.   If you use a 28-day pack that contains inactive pills and you miss 1 of the last 7 pills (pills with no hormones), it will not matter. Throw away the rest of the nonhormone pills and start a new pill pack.  No matter which day you start the OCP, you will always start a new pack on that same day of the week. Have an extra pack of OCPs and a backup contraceptive method available in case you miss some pills or lose your OCP pack.  HOME CARE INSTRUCTIONS   Do not smoke.   Always use a condom to protect against STDs. OCPs do not protect against STDs.   Use a calendar to mark your menstrual period days.   Read the information and directions that came with your OCP. Talk to your health care provider if you have questions.  SEEK MEDICAL CARE IF:   You develop nausea and vomiting.   You have abnormal vaginal discharge or bleeding.   You develop a rash.  You miss your menstrual period.   You are losing your hair.   You need treatment for mood swings or depression.   You get dizzy when taking the OCP.   You develop acne from taking the OCP.   You become pregnant.  SEEK IMMEDIATE MEDICAL CARE IF:   You develop chest pain.   You develop shortness of breath.   You have an uncontrolled or severe headache.   You develop numbness or slurred speech.   You develop visual problems.   You develop pain, redness, and swelling in the legs.  Document Released: 02/04/2011 Document Revised: 10/18/2012 Document Reviewed: 08/06/2012 [facility] Patient Information 2014  [place], [place]. Use condoms x 4 weeks, keep clean and dry x 24 hours, no heavy lifting, keep steri strips on x 72 hours, Keep pressure dressing on x 24 hours. Follow up prn problems.

## 2013-02-26 ENCOUNTER — Telehealth: Payer: Self-pay | Admitting: Adult Health

## 2013-02-26 ENCOUNTER — Telehealth: Payer: Self-pay

## 2013-02-26 ENCOUNTER — Other Ambulatory Visit: Payer: Self-pay | Admitting: Adult Health

## 2013-02-26 MED ORDER — METRONIDAZOLE 500 MG PO TABS: 500.0000 mg | ORAL_TABLET | Freq: Two times a day (BID) | ORAL | Status: DC

## 2013-02-26 MED ORDER — METRONIDAZOLE 0.75 % VA GEL: 1.0000 | Freq: Two times a day (BID) | VAGINAL | Status: DC

## 2013-02-26 NOTE — Telephone Encounter (Signed)
Pt requesting gel for Bacterial vaginosis.

## 2013-02-26 NOTE — Telephone Encounter (Signed)
Pt states that medication is not covered by medicaid. Is there something else you can give her that medicaid will cover?

## 2013-02-26 NOTE — Telephone Encounter (Signed)
Left message will call in flagyl

## 2013-03-01 NOTE — L&D Delivery Note (Signed)
Patient is 28 y.o. 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  APGAR: 8, 9; weight  pending Placenta status: , .  Cord: 3 vessels with the following complications: None.   Anesthesia: None  Episiotomy: None Lacerations:  none Suture Repair: n/a Est. Blood Loss (mL):  250  Mom to postpartum.  Baby to Couplet care / Skin to Skin.  [name] ROCIO 01/16/2014, 4:30 AM

## 2013-04-19 ENCOUNTER — Encounter: Payer: Self-pay | Admitting: Adult Health

## 2013-04-19 ENCOUNTER — Ambulatory Visit (INDEPENDENT_AMBULATORY_CARE_PROVIDER_SITE_OTHER): Payer: Medicaid Other | Admitting: Adult Health

## 2013-04-19 VITALS — BP 126/80 | Ht 64.0 in | Wt 185.0 lb

## 2013-04-19 DIAGNOSIS — B379 Candidiasis, unspecified: Secondary | ICD-10-CM

## 2013-04-19 DIAGNOSIS — N898 Other specified noninflammatory disorders of vagina: Secondary | ICD-10-CM | POA: Insufficient documentation

## 2013-04-19 DIAGNOSIS — Z3200 Encounter for pregnancy test, result unknown: Secondary | ICD-10-CM

## 2013-04-19 DIAGNOSIS — Z3202 Encounter for pregnancy test, result negative: Secondary | ICD-10-CM

## 2013-04-19 HISTORY — DX: Other specified noninflammatory disorders of vagina: N89.8

## 2013-04-19 HISTORY — DX: Candidiasis, unspecified: B37.9

## 2013-04-19 LAB — POCT WET PREP (WET MOUNT)

## 2013-04-19 LAB — POCT URINE PREGNANCY: PREG TEST UR: NEGATIVE

## 2013-04-19 MED ORDER — FLUCONAZOLE 150 MG PO TABS: ORAL_TABLET | ORAL | Status: DC

## 2013-04-19 NOTE — Patient Instructions (Signed)
Monilial Vaginitis Vaginitis in a soreness, swelling and redness (inflammation) of the vagina and vulva. Monilial vaginitis is not a sexually transmitted infection. CAUSES  Yeast vaginitis is caused by yeast (candida) that is normally found in your vagina. With a yeast infection, the candida has overgrown in number to a point that upsets the chemical balance. SYMPTOMS   White, thick vaginal discharge.  Swelling, itching, redness and irritation of the vagina and possibly the lips of the vagina (vulva).  Burning or painful urination.  Painful intercourse. DIAGNOSIS  Things that may contribute to monilial vaginitis are:  Postmenopausal and virginal states.  Pregnancy.  Infections.  Being tired, sick or stressed, especially if you had monilial vaginitis in the past.  Diabetes. Good control will help lower the chance.  Birth control pills.  Tight fitting garments.  Using bubble bath, feminine sprays, douches or deodorant tampons.  Taking certain medications that kill germs (antibiotics).  Sporadic recurrence can occur if you become ill. TREATMENT  Your caregiver will give you medication.  There are several kinds of anti monilial vaginal creams and suppositories specific for monilial vaginitis. For recurrent yeast infections, use a suppository or cream in the vagina 2 times a week, or as directed.  Anti-monilial or steroid cream for the itching or irritation of the vulva may also be used. Get your caregiver's permission.  Painting the vagina with methylene blue solution may help if the monilial cream does not work.  Eating yogurt may help prevent monilial vaginitis. HOME CARE INSTRUCTIONS   Finish all medication as prescribed.  Do not have sex until treatment is completed or after your caregiver tells you it is okay.  Take warm sitz baths.  Do not douche.  Do not use tampons, especially scented ones.  Wear cotton underwear.  Avoid tight pants and panty  hose.  Tell your sexual partner that you have a yeast infection. They should go to their caregiver if they have symptoms such as mild rash or itching.  Your sexual partner should be treated as well if your infection is difficult to eliminate.  Practice safer sex. Use condoms.  Some vaginal medications cause latex condoms to fail. Vaginal medications that harm condoms are:  Cleocin cream.  Butoconazole (Femstat).  Terconazole (Terazol) vaginal suppository.  Miconazole (Monistat) (may be purchased over the counter). SEEK MEDICAL CARE IF:   You have a temperature by mouth above 102 F (38.9 C).  The infection is getting worse after 2 days of treatment.  The infection is not getting better after 3 days of treatment.  You develop blisters in or around your vagina.  You develop vaginal bleeding, and it is not your menstrual period.  You have pain when you urinate.  You develop intestinal problems.  You have pain with sexual intercourse. Document Released: 11/25/2004 Document Revised: 05/10/2011 Document Reviewed: 08/09/2008 [employer] Patient Information 2014 [employer]. Follow up prn  

## 2013-04-19 NOTE — Progress Notes (Signed)
Subjective:     Patient ID: [name], female   DOB: 04/07/1985, 28 y.o.   MRN: [id number]  HPI [name] is a 28 year old black female in complaining of vaginal discharge and itch.Has not started OCs yet.  Review of Systems See HPI Reviewed past medical,surgical, social and family history. Reviewed medications and allergies.     Objective:   Physical Exam BP 126/80  Ht 5\' 4"  (1.626 m)  Wt 185 lb (83.915 kg)  BMI 31.74 kg/m2  LMP 01/21/2015UPT negative Skin warm and dry.Pelvic: external genitalia is normal in appearance, vagina: white discharge without odor, cervix:smooth and bulbous, uterus: normal size, shape and contour, non tender, no masses felt, adnexa: no masses or tenderness noted. Wet prep: + for yeast    Assessment:    Vaginal discharge Yeast     Plan:    Rx diflucan 150 mg #2 1 now and 1 in 3 days with 1 refill Review handout on yeast  Follow up prn  Get on OCs asap

## 2013-06-05 ENCOUNTER — Ambulatory Visit (INDEPENDENT_AMBULATORY_CARE_PROVIDER_SITE_OTHER): Payer: Medicaid Other | Admitting: Adult Health

## 2013-06-05 ENCOUNTER — Encounter: Payer: Self-pay | Admitting: Adult Health

## 2013-06-05 VITALS — BP 130/58 | Ht 64.0 in | Wt 188.0 lb

## 2013-06-05 DIAGNOSIS — Z3201 Encounter for pregnancy test, result positive: Secondary | ICD-10-CM

## 2013-06-05 LAB — POCT URINE PREGNANCY: Preg Test, Ur: POSITIVE

## 2013-06-05 MED ORDER — PRENATAL PLUS 27-1 MG PO TABS: ORAL_TABLET | ORAL | Status: DC

## 2013-06-05 NOTE — Progress Notes (Signed)
Patient ID: [name], female   DOB: 08-24-1985, 28 y.o.   MRN: [id number] Pt here for pregnancy test resulted positive, no complaints at this time. Pt to start PNV take one tablet daily per [name], CNM. Pt to return in 2 weeks for New OB. Pt informed to contact our office for any vaginal bleeding, severe cramping. Pt verbalized understanding.

## 2013-06-07 ENCOUNTER — Other Ambulatory Visit: Payer: Self-pay | Admitting: Obstetrics and Gynecology

## 2013-06-07 DIAGNOSIS — O3680X0 Pregnancy with inconclusive fetal viability, not applicable or unspecified: Secondary | ICD-10-CM

## 2013-06-09 ENCOUNTER — Inpatient Hospital Stay (HOSPITAL_COMMUNITY)
Admission: AD | Admit: 2013-06-09 | Discharge: 2013-06-09 | Disposition: A | Discharge status: Home / Self Care | Payer: Medicaid Other | Source: Ambulatory Visit | Attending: Obstetrics & Gynecology | Admitting: Obstetrics & Gynecology

## 2013-06-09 ENCOUNTER — Encounter (HOSPITAL_COMMUNITY): Payer: Self-pay | Admitting: *Deleted

## 2013-06-09 DIAGNOSIS — O2390 Unspecified genitourinary tract infection in pregnancy, unspecified trimester: Secondary | ICD-10-CM | POA: Insufficient documentation

## 2013-06-09 DIAGNOSIS — B9689 Other specified bacterial agents as the cause of diseases classified elsewhere: Secondary | ICD-10-CM

## 2013-06-09 DIAGNOSIS — A499 Bacterial infection, unspecified: Secondary | ICD-10-CM | POA: Insufficient documentation

## 2013-06-09 DIAGNOSIS — N760 Acute vaginitis: Secondary | ICD-10-CM | POA: Insufficient documentation

## 2013-06-09 DIAGNOSIS — O210 Mild hyperemesis gravidarum: Secondary | ICD-10-CM | POA: Insufficient documentation

## 2013-06-09 DIAGNOSIS — O219 Vomiting of pregnancy, unspecified: Secondary | ICD-10-CM

## 2013-06-09 DIAGNOSIS — R109 Unspecified abdominal pain: Secondary | ICD-10-CM | POA: Insufficient documentation

## 2013-06-09 LAB — URINALYSIS, ROUTINE W REFLEX MICROSCOPIC
Bilirubin Urine: NEGATIVE
GLUCOSE, UA: NEGATIVE mg/dL
Hgb urine dipstick: NEGATIVE
Ketones, ur: NEGATIVE mg/dL
Leukocytes, UA: NEGATIVE
Nitrite: NEGATIVE
PH: 8.5 — AB (ref 5.0–8.0)
PROTEIN: NEGATIVE mg/dL
Specific Gravity, Urine: 1.02 (ref 1.005–1.030)
Urobilinogen, UA: 0.2 mg/dL (ref 0.0–1.0)

## 2013-06-09 LAB — WET PREP, GENITAL
TRICH WET PREP: NONE SEEN
YEAST WET PREP: NONE SEEN

## 2013-06-09 MED ORDER — METRONIDAZOLE 500 MG PO TABS: 500.0000 mg | ORAL_TABLET | Freq: Two times a day (BID) | ORAL | Status: DC

## 2013-06-09 MED ORDER — DOXYLAMINE-PYRIDOXINE 10-10 MG PO TBEC: 2.0000 | DELAYED_RELEASE_TABLET | Freq: Every day | ORAL | Status: DC

## 2013-06-09 MED ORDER — PROMETHAZINE HCL 12.5 MG PO TABS: 25.0000 mg | ORAL_TABLET | Freq: Four times a day (QID) | ORAL | Status: DC | PRN

## 2013-06-09 MED ORDER — ACETAMINOPHEN 325 MG PO TABS
650.0000 mg | ORAL_TABLET | Freq: Once | ORAL | Status: AC
  Administered 2013-06-09: 650 mg via ORAL
  Filled 2013-06-09: qty 2

## 2013-06-09 NOTE — Discharge Instructions (Signed)
Call the office if you need additional samples. Rx Diclegis Drink at least 8 8-oz glasses of water every day. Take Tylenol 325 mg 2 tablets by mouth every 4 hours if needed for pain.

## 2013-06-09 NOTE — MAU Provider Note (Signed)
History     CSN: [id number]  Arrival date and time: 06/09/13 1044   First Provider Initiated Contact with Patient 06/09/13 1153      Chief Complaint  Patient presents with  . Abdominal Pain  . Vaginal Discharge   HPI [name] 28 y.o. [redacted]w[redacted]d  Comes to MAU as she has had discharge with odor for several days.  Having nausea as well.  [facility] and will have an appointment for an exam at 8 weeks.  Has had some lower midline abdominal pain and some upper midline abdominal pain.  OB History   Grav Para Term Preterm Abortions TAB SAB Ect Mult Living   4 3        3       Past Medical History  Diagnosis Date  . Irregular bleeding 01/17/2013  . Vaginal discharge 04/19/2013  . Yeast infection 04/19/2013    Past Surgical History  Procedure Laterality Date  . Ercp N/A 09/04/2012    Procedure: ENDOSCOPIC RETROGRADE CHOLANGIOPANCREATOGRAPHY (ERCP);  Surgeon: Rachael Feeaniel P Jacobs, MD;  [facility];  Service: Endoscopy;  Laterality: N/A;  . Cholecystectomy N/A 09/05/2012    Procedure: LAPAROSCOPIC CHOLECYSTECTOMY;  Surgeon: Emelia LoronMatthew Wakefield, MD;  [facility];  Service: General;  Laterality: N/A;    Family History  Problem Relation Age of Onset  . CAD Maternal Grandmother   . Hypertension Mother   . Diabetes Mother   . Breast cancer Maternal Aunt     History  Substance Use Topics  . Smoking status: Former Smoker -- 0.25 packs/day for 10 years    Quit date: 08/29/2012  . Smokeless tobacco: Never Used  . Alcohol Use: No    Allergies: No Known Allergies  No prescriptions prior to admission    Review of Systems  Constitutional: Negative for fever.  Gastrointestinal: Positive for nausea and abdominal pain. Negative for vomiting.  Genitourinary:       Vaginal discharge with odor. No vaginal bleeding. No dysuria.   Physical Exam   Blood pressure 111/54, pulse 62, temperature 97.9 F (36.6 C), temperature source Oral, resp.  rate 18, height 5\' 4"  (1.626 m), weight 183 lb 6 oz (83.178 kg), last menstrual period 04/20/2013.  Physical Exam  Nursing note and vitals reviewed. Constitutional: She is oriented to person, place, and time. She appears well-developed and well-nourished. No distress.  HENT:  Head: Normocephalic.  Eyes: EOM are normal.  Neck: Neck supple.  Respiratory: Effort normal.  GI: Soft. There is tenderness. There is no rebound and no guarding.  Mild tenderness in the lower midline  Genitourinary:  Speculum exam: Vagina - Fairly dry with minimal creamy discharge, no odor detected Cervix - Difficult to visualize. No contact bleeding Bimanual exam: Cervix closed,  Uterus retroverted 6-7 week size Adnexa non tender, no masses bilaterally GC/Chlam, wet prep done Chaperone present for exam.  Musculoskeletal: Normal range of motion.  Neurological: She is alert and oriented to person, place, and time.  Skin: Skin is warm and dry.  Psychiatric: She has a normal mood and affect.    MAU Course  Procedures  MDM Results for orders placed during the hospital encounter of 06/09/13 (from the past 24 hour(s))  URINALYSIS, ROUTINE W REFLEX MICROSCOPIC     Status: Abnormal   Collection Time    06/09/13 10:52 AM      Result Value Ref Range   Color, Urine YELLOW  YELLOW   APPearance HAZY (*) CLEAR  Specific Gravity, Urine 1.020  1.005 - 1.030   pH 8.5 (*) 5.0 - 8.0   Glucose, UA NEGATIVE  NEGATIVE mg/dL   Hgb urine dipstick NEGATIVE  NEGATIVE   Bilirubin Urine NEGATIVE  NEGATIVE   Ketones, ur NEGATIVE  NEGATIVE mg/dL   Protein, ur NEGATIVE  NEGATIVE mg/dL   Urobilinogen, UA 0.2  0.0 - 1.0 mg/dL   Nitrite NEGATIVE  NEGATIVE   Leukocytes, UA NEGATIVE  NEGATIVE  WET PREP, GENITAL     Status: Abnormal   Collection Time    06/09/13 11:57 AM      Result Value Ref Range   Yeast Wet Prep HPF POC NONE SEEN  NONE SEEN   Trich, Wet Prep NONE SEEN  NONE SEEN   Clue Cells Wet Prep HPF POC MANY (*) NONE  SEEN   WBC, Wet Prep HPF POC MODERATE (*) NONE SEEN     Assessment and Plan  BV in pregnancy Nausea in pregnancy  Plan Rx metronidazole 500 mg PO bid x 7 days Rx Phenergan 12.5 mg one PO PRN q 6 hours Faxed info for Diclegis Sample given Call the office if you need additional samples. Rx Diclegis Drink at least 8 8-oz glasses of water every day. Take Tylenol 325 mg 2 tablets by mouth every 4 hours if needed for pain.     Nike Southwell L Philopater Mucha 06/09/2013, 12:01 PM

## 2013-06-09 NOTE — MAU Note (Signed)
Patient presents with complaints of abdominal cramping since Monday and vaginal discharge since last night.

## 2013-06-09 NOTE — MAU Provider Note (Signed)
Attestation of Attending Supervision of Advanced Practitioner (PA/CNM/NP): Evaluation and management procedures were performed by the Advanced Practitioner under my supervision and collaboration.  I have reviewed the Advanced Practitioner's note and chart, and I agree with the management and plan.  Candee Hoon, MD, FACOG Attending Obstetrician & Gynecologist Faculty Practice, [facility]  

## 2013-06-11 LAB — GC/CHLAMYDIA PROBE AMP
CT PROBE, AMP APTIMA: NEGATIVE
GC Probe RNA: NEGATIVE

## 2013-06-13 ENCOUNTER — Ambulatory Visit (INDEPENDENT_AMBULATORY_CARE_PROVIDER_SITE_OTHER): Payer: Medicaid Other

## 2013-06-13 DIAGNOSIS — O3680X0 Pregnancy with inconclusive fetal viability, not applicable or unspecified: Secondary | ICD-10-CM

## 2013-06-13 NOTE — Progress Notes (Signed)
U/S(7+5wks)-single IUP with +FCA noted, FHR- 157 BPM, cx appears closed, bilateral adnexa appears WNL, small Sub Chorionic Hemorrhage noted adjacent to GS - 1.7cm, CRL c/w LMP dates

## 2013-06-19 ENCOUNTER — Encounter: Payer: Self-pay | Admitting: Women's Health

## 2013-06-19 ENCOUNTER — Ambulatory Visit (INDEPENDENT_AMBULATORY_CARE_PROVIDER_SITE_OTHER): Payer: Medicaid Other | Admitting: Women's Health

## 2013-06-19 VITALS — BP 120/56 | Wt 185.8 lb

## 2013-06-19 DIAGNOSIS — Z3480 Encounter for supervision of other normal pregnancy, unspecified trimester: Secondary | ICD-10-CM | POA: Insufficient documentation

## 2013-06-19 DIAGNOSIS — O99320 Drug use complicating pregnancy, unspecified trimester: Secondary | ICD-10-CM

## 2013-06-19 DIAGNOSIS — Z3490 Encounter for supervision of normal pregnancy, unspecified, unspecified trimester: Secondary | ICD-10-CM | POA: Insufficient documentation

## 2013-06-19 DIAGNOSIS — R768 Other specified abnormal immunological findings in serum: Secondary | ICD-10-CM | POA: Insufficient documentation

## 2013-06-19 DIAGNOSIS — Z9049 Acquired absence of other specified parts of digestive tract: Secondary | ICD-10-CM

## 2013-06-19 DIAGNOSIS — Z331 Pregnant state, incidental: Secondary | ICD-10-CM

## 2013-06-19 DIAGNOSIS — Z1389 Encounter for screening for other disorder: Secondary | ICD-10-CM

## 2013-06-19 DIAGNOSIS — O99019 Anemia complicating pregnancy, unspecified trimester: Secondary | ICD-10-CM

## 2013-06-19 DIAGNOSIS — O98519 Other viral diseases complicating pregnancy, unspecified trimester: Secondary | ICD-10-CM

## 2013-06-19 DIAGNOSIS — F1920 Other psychoactive substance dependence, uncomplicated: Secondary | ICD-10-CM

## 2013-06-19 HISTORY — DX: Acquired absence of other specified parts of digestive tract: Z90.49

## 2013-06-19 LAB — CBC
HEMATOCRIT: 35.2 % — AB (ref 36.0–46.0)
HEMOGLOBIN: 11.9 g/dL — AB (ref 12.0–15.0)
MCH: 30.3 pg (ref 26.0–34.0)
MCHC: 33.8 g/dL (ref 30.0–36.0)
MCV: 89.6 fL (ref 78.0–100.0)
Platelets: 249 10*3/uL (ref 150–400)
RBC: 3.93 MIL/uL (ref 3.87–5.11)
RDW: 13.7 % (ref 11.5–15.5)
WBC: 9.9 10*3/uL (ref 4.0–10.5)

## 2013-06-19 LAB — POCT URINALYSIS DIPSTICK
Blood, UA: NEGATIVE
GLUCOSE UA: NEGATIVE
KETONES UA: NEGATIVE
Nitrite, UA: NEGATIVE
Protein, UA: NEGATIVE

## 2013-06-19 NOTE — Progress Notes (Signed)
  Subjective:  [name] is a 28 y.o. [id number] African American female at 7917w4d by LMP c/w 7wk u/s, being seen today for her first obstetrical visit.  Her obstetrical history is significant for obesity and term uncomplicated svd x 3.  Pregnancy history fully reviewed. Dx w/ BV last wk in MAU, taking flagyl.   Patient reports n/v- currently on phenergan and doing well. Denies vb, cramping, uti s/s, abnormal/malodorous vag d/c, or vulvovaginal itching/irritation.  BP 120/56  Wt 185 lb 12.8 oz (84.278 kg)  LMP 04/20/2013  HISTORY: OB History  Gravida Para Term Preterm AB SAB TAB Ectopic Multiple Living  4 3 3       3     # Outcome Date GA Lbr Len/2nd Weight Sex Delivery Anes PTL Lv  4 CUR           3 TRM 2011 7733w0d  8 lb (3.629 kg) M SVD None  Y  2 TRM 2007 7555w0d  8 lb (3.629 kg) F SVD None  Y  1 TRM 2006 6962w0d  8 lb (3.629 kg) M SVD None  Y     Past Medical History  Diagnosis Date  . Irregular bleeding 01/17/2013  . Vaginal discharge 04/19/2013  . Yeast infection 04/19/2013   Past Surgical History  Procedure Laterality Date  . Ercp N/A 09/04/2012    Procedure: ENDOSCOPIC RETROGRADE CHOLANGIOPANCREATOGRAPHY (ERCP);  Surgeon: Rachael Feeaniel P Jacobs, MD;  [facility];  Service: Endoscopy;  Laterality: N/A;  . Cholecystectomy N/A 09/05/2012    Procedure: LAPAROSCOPIC CHOLECYSTECTOMY;  Surgeon: Emelia LoronMatthew Wakefield, MD;  [facility];  Service: General;  Laterality: N/A;   Family History  Problem Relation Age of Onset  . CAD Maternal Grandmother   . Hypertension Mother   . Diabetes Mother   . Breast cancer Maternal Aunt     Exam   System:     General: Well developed & nourished, no acute distress   Skin: Warm & dry, normal coloration and turgor, no rashes   Neurologic: Alert & oriented, normal mood   Cardiovascular: Regular rate & rhythm   Respiratory: Effort & rate normal, LCTAB, acyanotic   Abdomen: Soft, non tender   Extremities: normal strength, tone   Pelvic Exam:    Perineum: Normal perineum   Vulva: Normal, no lesions   Vagina:  Normal mucosa, normal discharge   Cervix: Normal, bulbous, appears closed   Uterus: Normal size/shape/contour for GA   Thin prep pap smear neg 04/24/12 FHR: 170 via informal transabdominal u/s   Assessment:   Pregnancy: [id number] Patient Active Problem List   Diagnosis Date Noted  . Supervision of other normal pregnancy 06/19/2013    Priority: High  . S/P cholecystectomy 06/19/2013    Priority: High  . Yeast infection 04/19/2013  . Irregular bleeding 01/17/2013  . Hypokalemia 09/02/2012    9217w4d [id number] New OB visit Obesity N/V of pregnancy BV dx last wk at MAU    Plan:  Initial labs drawn Continue prenatal vitamins Problem list reviewed and updated Reviewed n/v relief measures and warning s/s to report Reviewed recommended weight gain based on pre-gravid BMI Encouraged well-balanced diet Genetic Screening discussed Integrated Screen: requested Cystic fibrosis screening discussed requested Ultrasound discussed; fetal survey: requested Follow up in 4 weeks for 1st nt/it and visit [facility] completed  Marge DuncansKimberly Randall Crislyn Willbanks CNM, [facility]NP-BC 06/19/2013 3:01 PM

## 2013-06-19 NOTE — Patient Instructions (Signed)
Nausea & Vomiting  Have saltine crackers or pretzels by your bed and eat a few bites before you raise your head out of bed in the morning  Eat small frequent meals throughout the day instead of large meals  Drink plenty of fluids throughout the day to stay hydrated, just don't drink a lot of fluids with your meals.  This can make your stomach fill up faster making you feel sick  Do not brush your teeth right after you eat  Products with real ginger are good for nausea, like ginger ale and ginger hard candy Make sure it says made with real ginger!  Sucking on sour candy like lemon heads is also good for nausea  If your prenatal vitamins make you nauseated, take them at night so you will sleep through the nausea  If you feel like you need medicine for the nausea & vomiting please let us know  If you are unable to keep any fluids or food down please let us know    Pregnancy - First Trimester During sexual intercourse, millions of sperm go into the vagina. Only 1 sperm will penetrate and fertilize the female egg while it is in the Fallopian tube. One week later, the fertilized egg implants into the wall of the uterus. An embryo begins to develop into a baby. At 6 to 8 weeks, the eyes and face are formed and the heartbeat can be seen on ultrasound. At the end of 12 weeks (first trimester), all the baby's organs are formed. Now that you are pregnant, you will want to do everything you can to have a healthy baby. Two of the most important things are to get good prenatal care and follow your caregiver's instructions. Prenatal care is all the medical care you receive before the baby's birth. It is given to prevent, find, and treat problems during the pregnancy and childbirth. PRENATAL EXAMS  During prenatal visits, your weight, blood pressure, and urine are checked. This is done to make sure you are healthy and progressing normally during the pregnancy.  A pregnant woman should gain 25 to 35 pounds  during the pregnancy. However, if you are overweight or underweight, your caregiver will advise you regarding your weight.  Your caregiver will ask and answer questions for you.  Blood work, cervical cultures, other necessary tests, and a Pap test are done during your prenatal exams. These tests are done to check on your health and the probable health of your baby. Tests are strongly recommended and done for HIV with your permission. This is the virus that causes AIDS. These tests are done because medicines can be given to help prevent your baby from being born with this infection should you have been infected without knowing it. Blood work is also used to find out your blood type, previous infections, and follow your blood levels (hemoglobin).  Low hemoglobin (anemia) is common during pregnancy. Iron and vitamins are given to help prevent this. Later in the pregnancy, blood tests for diabetes will be done along with any other tests if any problems develop.  You may need other tests to make sure you and the baby are doing well. CHANGES DURING THE FIRST TRIMESTER  Your body goes through many changes during pregnancy. They vary from person to person. Talk to your caregiver about changes you notice and are concerned about. Changes can include:  Your menstrual period stops.  The egg and sperm carry the genes that determine what you look like. Genes from you   and your partner are forming a baby. The female genes determine whether the baby is a boy or a girl.  Your body increases in girth and you may feel bloated.  Feeling sick to your stomach (nauseous) and throwing up (vomiting). If the vomiting is uncontrollable, call your caregiver.  Your breasts will begin to enlarge and become tender.  Your nipples may stick out more and become darker.  The need to urinate more. Painful urination may mean you have a bladder infection.  Tiring easily.  Loss of appetite.  Cravings for certain kinds of  food.  At first, you may gain or lose a couple of pounds.  You may have changes in your emotions from day to day (excited to be pregnant or concerned something may go wrong with the pregnancy and baby).  You may have more vivid and strange dreams. HOME CARE INSTRUCTIONS   It is very important to avoid all smoking, alcohol and non-prescribed drugs during your pregnancy. These affect the formation and growth of the baby. Avoid chemicals while pregnant to ensure the delivery of a healthy infant.  Start your prenatal visits by the 12th week of pregnancy. They are usually scheduled monthly at first, then more often in the last 2 months before delivery. Keep your caregiver's appointments. Follow your caregiver's instructions regarding medicine use, blood and lab tests, exercise, and diet.  During pregnancy, you are providing food for you and your baby. Eat regular, well-balanced meals. Choose foods such as meat, fish, milk and other low fat dairy products, vegetables, fruits, and whole-grain breads and cereals. Your caregiver will tell you of the ideal weight gain.  You can help morning sickness by keeping soda crackers at the bedside. Eat a couple before arising in the morning. You may want to use the crackers without salt on them.  Eating 4 to 5 small meals rather than 3 large meals a day also may help the nausea and vomiting.  Drinking liquids between meals instead of during meals also seems to help nausea and vomiting.  A physical sexual relationship may be continued throughout pregnancy if there are no other problems. Problems may be early (premature) leaking of amniotic fluid from the membranes, vaginal bleeding, or belly (abdominal) pain.  Exercise regularly if there are no restrictions. Check with your caregiver or physical therapist if you are unsure of the safety of some of your exercises. Greater weight gain will occur in the last 2 trimesters of pregnancy. Exercising will  help:  Control your weight.  Keep you in shape.  Prepare you for labor and delivery.  Help you lose your pregnancy weight after you deliver your baby.  Wear a good support or jogging bra for breast tenderness during pregnancy. This may help if worn during sleep too.  Ask when prenatal classes are available. Begin classes when they are offered.  Do not use hot tubs, steam rooms, or saunas.  Wear your seat belt when driving. This protects you and your baby if you are in an accident.  Avoid raw meat, uncooked cheese, cat litter boxes, and soil used by cats throughout the pregnancy. These carry germs that can cause birth defects in the baby.  The first trimester is a good time to visit your dentist for your dental health. Getting your teeth cleaned is okay. Use a softer toothbrush and brush gently during pregnancy.  Ask for help if you have financial, counseling, or nutritional needs during pregnancy. Your caregiver will be able to offer counseling for   these needs as well as refer you for other special needs.  Do not take any medicines or herbs unless told by your caregiver.  Inform your caregiver if there is any mental or physical domestic violence.  Make a list of emergency phone numbers of family, friends, hospital, and police and fire departments.  Write down your questions. Take them to your prenatal visit.  Do not douche.  Do not cross your legs.  If you have to stand for long periods of time, rotate you feet or take small steps in a circle.  You may have more vaginal secretions that may require a sanitary pad. Do not use tampons or scented sanitary pads. MEDICINES AND DRUG USE IN PREGNANCY  Take prenatal vitamins as directed. The vitamin should contain 1 milligram of folic acid. Keep all vitamins out of reach of children. Only a couple vitamins or tablets containing iron may be fatal to a baby or young child when ingested.  Avoid use of all medicines, including herbs,  over-the-counter medicines, not prescribed or suggested by your caregiver. Only take over-the-counter or prescription medicines for pain, discomfort, or fever as directed by your caregiver. Do not use aspirin, ibuprofen, or naproxen unless directed by your caregiver.  Let your caregiver also know about herbs you may be using.  Alcohol is related to a number of birth defects. This includes fetal alcohol syndrome. All alcohol, in any form, should be avoided completely. Smoking will cause low birth rate and premature babies.  Street or illegal drugs are very harmful to the baby. They are absolutely forbidden. A baby born to an addicted mother will be addicted at birth. The baby will go through the same withdrawal an adult does.  Let your caregiver know about any medicines that you have to take and for what reason you take them. SEEK MEDICAL CARE IF:  You have any concerns or worries during your pregnancy. It is better to call with your questions if you feel they cannot wait, rather than worry about them. SEEK IMMEDIATE MEDICAL CARE IF:   An unexplained oral temperature above 102 F (38.9 C) develops, or as your caregiver suggests.  You have leaking of fluid from the vagina (birth canal). If leaking membranes are suspected, take your temperature and inform your caregiver of this when you call.  There is vaginal spotting or bleeding. Notify your caregiver of the amount and how many pads are used.  You develop a bad smelling vaginal discharge with a change in the color.  You continue to feel sick to your stomach (nauseated) and have no relief from remedies suggested. You vomit blood or coffee ground-like materials.  You lose more than 2 pounds of weight in 1 week.  You gain more than 2 pounds of weight in 1 week and you notice swelling of your face, hands, feet, or legs.  You gain 5 pounds or more in 1 week (even if you do not have swelling of your hands, face, legs, or feet).  You get  exposed to German measles and have never had them.  You are exposed to fifth disease or chickenpox.  You develop belly (abdominal) pain. Round ligament discomfort is a common non-cancerous (benign) cause of abdominal pain in pregnancy. Your caregiver still must evaluate this.  You develop headache, fever, diarrhea, pain with urination, or shortness of breath.  You fall or are in a car accident or have any kind of trauma.  There is mental or physical violence in your home. Document   Released: 02/09/2001 Document Revised: 11/10/2011 Document Reviewed: 08/13/2008 [employer] Patient Information 2014 [employer].  

## 2013-06-20 ENCOUNTER — Encounter: Payer: Self-pay | Admitting: Women's Health

## 2013-06-20 DIAGNOSIS — F1290 Cannabis use, unspecified, uncomplicated: Secondary | ICD-10-CM | POA: Insufficient documentation

## 2013-06-20 LAB — URINALYSIS
Bilirubin Urine: NEGATIVE
Glucose, UA: NEGATIVE mg/dL
Hgb urine dipstick: NEGATIVE
Ketones, ur: NEGATIVE mg/dL
LEUKOCYTES UA: NEGATIVE
NITRITE: NEGATIVE
Protein, ur: NEGATIVE mg/dL
SPECIFIC GRAVITY, URINE: 1.018 (ref 1.005–1.030)
Urobilinogen, UA: 0.2 mg/dL (ref 0.0–1.0)
pH: 8 (ref 5.0–8.0)

## 2013-06-20 LAB — ABO AND RH: Rh Type: POSITIVE

## 2013-06-20 LAB — RPR

## 2013-06-20 LAB — DRUG SCREEN, URINE, NO CONFIRMATION
Amphetamine Screen, Ur: NEGATIVE
Barbiturate Quant, Ur: NEGATIVE
Benzodiazepines.: NEGATIVE
COCAINE METABOLITES: NEGATIVE
Creatinine,U: 109.5 mg/dL
Marijuana Metabolite: POSITIVE — AB
Methadone: NEGATIVE
Opiate Screen, Urine: NEGATIVE
PHENCYCLIDINE (PCP): NEGATIVE
PROPOXYPHENE: NEGATIVE

## 2013-06-20 LAB — RUBELLA SCREEN: Rubella: 2.36 Index — ABNORMAL HIGH (ref <0.90)

## 2013-06-20 LAB — VARICELLA ZOSTER ANTIBODY, IGG: Varicella IgG: 759 Index — ABNORMAL HIGH (ref <135.00)

## 2013-06-20 LAB — URINE CULTURE
Colony Count: NO GROWTH
ORGANISM ID, BACTERIA: NO GROWTH

## 2013-06-20 LAB — HIV ANTIBODY (ROUTINE TESTING W REFLEX): HIV 1&2 Ab, 4th Generation: NONREACTIVE

## 2013-06-20 LAB — OXYCODONE SCREEN, UA, RFLX CONFIRM: OXYCODONE SCRN UR: NEGATIVE ng/mL

## 2013-06-20 LAB — ANTIBODY SCREEN: Antibody Screen: NEGATIVE

## 2013-06-20 LAB — HEPATITIS B SURFACE ANTIGEN: HEP B S AG: NEGATIVE

## 2013-06-20 LAB — GC/CHLAMYDIA PROBE AMP
CT PROBE, AMP APTIMA: NEGATIVE
GC PROBE AMP APTIMA: NEGATIVE

## 2013-06-20 LAB — SICKLE CELL SCREEN: SICKLE CELL SCREEN: NEGATIVE

## 2013-06-20 LAB — CYSTIC FIBROSIS DIAGNOSTIC STUDY

## 2013-06-26 ENCOUNTER — Encounter: Payer: Self-pay | Admitting: Women's Health

## 2013-07-17 ENCOUNTER — Other Ambulatory Visit: Payer: Self-pay | Admitting: Women's Health

## 2013-07-17 ENCOUNTER — Ambulatory Visit (INDEPENDENT_AMBULATORY_CARE_PROVIDER_SITE_OTHER): Payer: Self-pay | Admitting: Obstetrics & Gynecology

## 2013-07-17 ENCOUNTER — Ambulatory Visit (INDEPENDENT_AMBULATORY_CARE_PROVIDER_SITE_OTHER): Payer: Medicaid Other

## 2013-07-17 VITALS — BP 120/52 | Wt 185.0 lb

## 2013-07-17 DIAGNOSIS — Z1389 Encounter for screening for other disorder: Secondary | ICD-10-CM

## 2013-07-17 DIAGNOSIS — Z3480 Encounter for supervision of other normal pregnancy, unspecified trimester: Secondary | ICD-10-CM

## 2013-07-17 DIAGNOSIS — Z36 Encounter for antenatal screening of mother: Secondary | ICD-10-CM

## 2013-07-17 DIAGNOSIS — Z331 Pregnant state, incidental: Secondary | ICD-10-CM

## 2013-07-17 LAB — POCT URINALYSIS DIPSTICK
Glucose, UA: NEGATIVE
Ketones, UA: NEGATIVE
LEUKOCYTES UA: NEGATIVE
Nitrite, UA: NEGATIVE
PROTEIN UA: NEGATIVE
RBC UA: NEGATIVE

## 2013-07-17 NOTE — Progress Notes (Signed)
U/S(12+4wks)-single active fetus, CRL c/w LMP date, cx appears closed (3.4cm), posterior Gr 0 placenta, bilateral adnexa appears WNL, FHR-152 BPM, NB present, NT-1.216mm

## 2013-07-17 NOTE — Progress Notes (Signed)
Sonogram is reviewed and report done, normal  No problems no complaints No bleeding  1st IT today, 2nd IT 4 weeks Sonogram 2 months

## 2013-07-25 LAB — MATERNAL SCREEN, INTEGRATED #1

## 2013-08-14 ENCOUNTER — Ambulatory Visit (INDEPENDENT_AMBULATORY_CARE_PROVIDER_SITE_OTHER): Payer: Self-pay | Admitting: Women's Health

## 2013-08-14 ENCOUNTER — Encounter: Payer: Self-pay | Admitting: Women's Health

## 2013-08-14 VITALS — BP 132/60 | Wt 186.0 lb

## 2013-08-14 DIAGNOSIS — Z1389 Encounter for screening for other disorder: Secondary | ICD-10-CM

## 2013-08-14 DIAGNOSIS — Z3480 Encounter for supervision of other normal pregnancy, unspecified trimester: Secondary | ICD-10-CM

## 2013-08-14 DIAGNOSIS — F1290 Cannabis use, unspecified, uncomplicated: Secondary | ICD-10-CM

## 2013-08-14 DIAGNOSIS — Z331 Pregnant state, incidental: Secondary | ICD-10-CM

## 2013-08-14 LAB — POCT URINALYSIS DIPSTICK
Glucose, UA: NEGATIVE
Ketones, UA: NEGATIVE
Leukocytes, UA: NEGATIVE
Nitrite, UA: NEGATIVE
RBC UA: NEGATIVE

## 2013-08-14 NOTE — Progress Notes (Signed)
Low-risk OB appointment [id number] Estimated Date of Delivery: 01/25/14 Blood pressure 132/60, weight 186 lb (84.369 kg), last menstrual period 04/20/2013.  BP, weight, and urine results all reviewed and noted.  Please refer to the obstetrical flow sheet for the fundal height and fetal heart rate documentation. Reports good fm.  Denies regular uc's, lof, vb, or uti s/s. No complaints. Denies smoking THC for few years now- notified of +UDS in April, advised against THC use. Reviewed warning s/s to report. Plan:  Continued routine obstetrical care, check uds next visit F/U in 4wks for OB appointment, anatomy u/s and uds 2nd IT today

## 2013-08-14 NOTE — Patient Instructions (Signed)
[place] Pediatricians:  [facility] [phone]  (isn't accepting new pts)          [facility] [phone]                 [facility] [phone] (usually doesn't accept new patients unless you have family there already, you are always welcome to call and ask)             [facility] ([address] [place]) [phone]   [facility]:   [facility]: [phone]  [facility]: [phone]   Second Trimester of Pregnancy The second trimester is from week 13 through week 28, months 4 through 6. The second trimester is often a time when you feel your best. Your body has also adjusted to being pregnant, and you begin to feel better physically. Usually, morning sickness has lessened or quit completely, you may have more energy, and you may have an increase in appetite. The second trimester is also a time when the fetus is growing rapidly. At the end of the sixth month, the fetus is about 9 inches long and weighs about 1 pounds. You will likely begin to feel the baby move (quickening) between 18 and 20 weeks of the pregnancy. BODY CHANGES Your body goes through many changes during pregnancy. The changes vary from woman to woman.   Your weight will continue to increase. You will notice your lower abdomen bulging out.  You may begin to get stretch marks on your hips, abdomen, and breasts.  You may develop headaches that can be relieved by medicines approved by your caregiver.  You may urinate more often because the fetus is pressing on your bladder.  You may develop or continue to have heartburn as a result of your pregnancy.  You may develop constipation because certain hormones are causing the muscles that push waste through your intestines to slow down.  You may develop hemorrhoids or swollen, bulging veins (varicose veins).  You may have back pain because of the  weight gain and pregnancy hormones relaxing your joints between the bones in your pelvis and as a result of a shift in weight and the muscles that support your balance.  Your breasts will continue to grow and be tender.  Your gums may bleed and may be sensitive to brushing and flossing.  Dark spots or blotches (chloasma, mask of pregnancy) may develop on your face. This will likely fade after the baby is born.  A dark line from your belly button to the pubic area (linea nigra) may appear. This will likely fade after the baby is born. WHAT TO EXPECT AT YOUR PRENATAL VISITS During a routine prenatal visit:  You will be weighed to make sure you and the fetus are growing normally.  Your blood pressure will be taken.  Your abdomen will be measured to track your baby's growth.  The fetal heartbeat will be listened to.  Any test results from the previous visit will be discussed. Your caregiver may ask you:  How you are feeling.  If you are feeling the baby move.  If you have had any abnormal symptoms, such as leaking fluid, bleeding, severe headaches, or abdominal cramping.  If you have any questions. Other tests that may be performed during your second trimester include:  Blood tests that check for:  Low iron levels (anemia).  Gestational diabetes (between 24 and 28 weeks).  Rh antibodies.  Urine tests to check for infections,  diabetes, or protein in the urine.  An ultrasound to confirm the proper growth and development of the baby.  An amniocentesis to check for possible genetic problems.  Fetal screens for spina bifida and Down syndrome. HOME CARE INSTRUCTIONS   Avoid all smoking, herbs, alcohol, and unprescribed drugs. These chemicals affect the formation and growth of the baby.  Follow your caregiver's instructions regarding medicine use. There are medicines that are either safe or unsafe to take during pregnancy.  Exercise only as directed by your caregiver.  Experiencing uterine cramps is a good sign to stop exercising.  Continue to eat regular, healthy meals.  Wear a good support bra for breast tenderness.  Do not use hot tubs, steam rooms, or saunas.  Wear your seat belt at all times when driving.  Avoid raw meat, uncooked cheese, cat litter boxes, and soil used by cats. These carry germs that can cause birth defects in the baby.  Take your prenatal vitamins.  Try taking a stool softener (if your caregiver approves) if you develop constipation. Eat more high-fiber foods, such as fresh vegetables or fruit and whole grains. Drink plenty of fluids to keep your urine clear or pale yellow.  Take warm sitz baths to soothe any pain or discomfort caused by hemorrhoids. Use hemorrhoid cream if your caregiver approves.  If you develop varicose veins, wear support hose. Elevate your feet for 15 minutes, 3 4 times a day. Limit salt in your diet.  Avoid heavy lifting, wear low heel shoes, and practice good posture.  Rest with your legs elevated if you have leg cramps or low back pain.  Visit your dentist if you have not gone yet during your pregnancy. Use a soft toothbrush to brush your teeth and be gentle when you floss.  A sexual relationship may be continued unless your caregiver directs you otherwise.  Continue to go to all your prenatal visits as directed by your caregiver. SEEK MEDICAL CARE IF:   You have dizziness.  You have mild pelvic cramps, pelvic pressure, or nagging pain in the abdominal area.  You have persistent nausea, vomiting, or diarrhea.  You have a bad smelling vaginal discharge.  You have pain with urination. SEEK IMMEDIATE MEDICAL CARE IF:   You have a fever.  You are leaking fluid from your vagina.  You have spotting or bleeding from your vagina.  You have severe abdominal cramping or pain.  You have rapid weight gain or loss.  You have shortness of breath with chest pain.  You notice sudden or extreme  swelling of your face, hands, ankles, feet, or legs.  You have not felt your baby move in over an hour.  You have severe headaches that do not go away with medicine.  You have vision changes. Document Released: 02/09/2001 Document Revised: 10/18/2012 Document Reviewed: 04/18/2012 [facility] Patient Information 2014 [place] [place].

## 2013-08-18 LAB — MATERNAL SCREEN, INTEGRATED #2
AFP MoM: 0.78
AFP, Serum: 26.7 ng/mL
CALCULATED GESTATIONAL AGE MAT SCREEN: 16.4
CROWN RUMP LENGTH MAT SCREEN 2: 62.1 mm
ESTRIOL MOM MAT SCREEN: 0.96
Estriol, Free: 0.81 ng/mL
INHIBIN A DIMERIC MAT SCREEN: 111 pg/mL
Inhibin A MoM: 0.74
NT MOM MAT SCREEN: 1.13
Nuchal Translucency: 1.6 mm
Number of fetuses: 1
PAPP-A MAT SCREEN: 425 ng/mL
PAPP-A MoM: 0.42
hCG MoM: 0.65
hCG, Serum: 22.3 IU/mL

## 2013-08-21 ENCOUNTER — Encounter: Payer: Self-pay | Admitting: Women's Health

## 2013-09-12 ENCOUNTER — Ambulatory Visit (INDEPENDENT_AMBULATORY_CARE_PROVIDER_SITE_OTHER): Payer: Medicaid Other

## 2013-09-12 ENCOUNTER — Other Ambulatory Visit: Payer: Self-pay | Admitting: Women's Health

## 2013-09-12 DIAGNOSIS — O355XX1 Maternal care for (suspected) damage to fetus by drugs, fetus 1: Secondary | ICD-10-CM

## 2013-09-12 DIAGNOSIS — Z3482 Encounter for supervision of other normal pregnancy, second trimester: Secondary | ICD-10-CM

## 2013-09-12 DIAGNOSIS — Z1389 Encounter for screening for other disorder: Secondary | ICD-10-CM

## 2013-09-12 DIAGNOSIS — O3090 Multiple gestation, unspecified, unspecified trimester: Secondary | ICD-10-CM

## 2013-09-12 DIAGNOSIS — Z3480 Encounter for supervision of other normal pregnancy, unspecified trimester: Secondary | ICD-10-CM

## 2013-09-12 DIAGNOSIS — O355XX0 Maternal care for (suspected) damage to fetus by drugs, not applicable or unspecified: Secondary | ICD-10-CM

## 2013-09-12 NOTE — Progress Notes (Signed)
U/S(20+5wks)-active fetus, meas c/w dates, fluid WNL, posterior Gr 0 placenta, cx appears closed (4.7cm), bilateral adnexa appears WNL, FHR- 141 bpm, no major abnl noted, female fetus

## 2013-09-13 ENCOUNTER — Encounter: Payer: Medicaid Other | Admitting: Advanced Practice Midwife

## 2013-09-19 DIAGNOSIS — F32A Depression, unspecified: Secondary | ICD-10-CM | POA: Insufficient documentation

## 2013-09-19 DIAGNOSIS — F419 Anxiety disorder, unspecified: Secondary | ICD-10-CM | POA: Insufficient documentation

## 2013-09-20 ENCOUNTER — Encounter: Payer: Self-pay | Admitting: Advanced Practice Midwife

## 2013-09-20 ENCOUNTER — Ambulatory Visit (INDEPENDENT_AMBULATORY_CARE_PROVIDER_SITE_OTHER): Payer: Self-pay | Admitting: Advanced Practice Midwife

## 2013-09-20 VITALS — BP 122/66 | Wt 193.0 lb

## 2013-09-20 DIAGNOSIS — Z331 Pregnant state, incidental: Secondary | ICD-10-CM

## 2013-09-20 DIAGNOSIS — Z1389 Encounter for screening for other disorder: Secondary | ICD-10-CM

## 2013-09-20 DIAGNOSIS — Z3480 Encounter for supervision of other normal pregnancy, unspecified trimester: Secondary | ICD-10-CM

## 2013-09-20 DIAGNOSIS — Z3482 Encounter for supervision of other normal pregnancy, second trimester: Secondary | ICD-10-CM

## 2013-09-20 LAB — POCT URINALYSIS DIPSTICK
Glucose, UA: NEGATIVE
Ketones, UA: NEGATIVE
Leukocytes, UA: NEGATIVE
NITRITE UA: NEGATIVE
Protein, UA: NEGATIVE
RBC UA: NEGATIVE

## 2013-09-20 NOTE — Progress Notes (Signed)
[id number] Estimated Date of Delivery: 01/25/14  Blood pressure 122/66, weight 193 lb (87.544 kg), last menstrual period 04/20/2013.   BP weight and urine results all reviewed and noted.  Please refer to the obstetrical flow sheet for the fundal height and fetal heart rate documentation:  Patient reports good fetal movement, denies any bleeding and no rupture of membranes symptoms or regular contractions. Patient is without complaints. All questions were answered.  Plan:  Continued routine obstetrical care,   Follow up in 4 weeks for OB appointment,

## 2013-10-08 ENCOUNTER — Encounter: Payer: Self-pay | Admitting: Women's Health

## 2013-10-18 ENCOUNTER — Encounter: Payer: Self-pay | Admitting: Advanced Practice Midwife

## 2013-10-18 ENCOUNTER — Ambulatory Visit (INDEPENDENT_AMBULATORY_CARE_PROVIDER_SITE_OTHER): Payer: Self-pay | Admitting: Advanced Practice Midwife

## 2013-10-18 VITALS — BP 120/80 | Wt 197.0 lb

## 2013-10-18 DIAGNOSIS — Z3482 Encounter for supervision of other normal pregnancy, second trimester: Secondary | ICD-10-CM

## 2013-10-18 DIAGNOSIS — Z3480 Encounter for supervision of other normal pregnancy, unspecified trimester: Secondary | ICD-10-CM

## 2013-10-18 DIAGNOSIS — Z331 Pregnant state, incidental: Secondary | ICD-10-CM

## 2013-10-18 DIAGNOSIS — Z1389 Encounter for screening for other disorder: Secondary | ICD-10-CM

## 2013-10-18 LAB — POCT URINALYSIS DIPSTICK
GLUCOSE UA: NEGATIVE
Ketones, UA: NEGATIVE
Leukocytes, UA: NEGATIVE
Nitrite, UA: NEGATIVE
PROTEIN UA: NEGATIVE
RBC UA: NEGATIVE

## 2013-10-18 NOTE — Progress Notes (Signed)
[id number] Estimated Date of Delivery: 01/25/14  Blood pressure 120/80, weight 197 lb (89.359 kg), last menstrual period 04/20/2013.   BP weight and urine results all reviewed and noted.  Please refer to the obstetrical flow sheet for the fundal height and fetal heart rate documentation:  Patient reports good fetal movement, denies any bleeding and no rupture of membranes symptoms or regular contractions. Patient is without complaints. All questions were answered.  Plan:  Continued routine obstetrical care,   Follow up in 2 weeks for OB appointment, PN2

## 2013-10-18 NOTE — Patient Instructions (Signed)
1. Before your test, do not eat or drink anything for 8-10 hours prior to your  appointment (a small amount of water is allowed and you may take any medicines you normally take). Be sure to drink lots of water the day before. 2. When you arrive, your blood will be drawn for a 'fasting' blood sugar level.  Then you will be given a sweetened carbonated beverage to drink. You should  complete drinking this beverage within five minutes. After finishing the  beverage, you will have your blood drawn exactly 1 and 2 hours later. Having  your blood drawn on time is an important part of this test. A total of three blood  samples will be done. 3. The test takes approximately 2  hours. During the test, do not have anything to  eat or drink. Do not smoke, chew gum (not even sugarless gum) or use breath mints.  4. During the test you should remain close by and seated as much as possible and  avoid walking around. You may want to bring a book or something else to  occupy your time.  5. After your test, you may eat and drink as normal. You may want to bring a snack  to eat after the test is finished. Your provider will advise you as to the results of  this test and any follow-up if necessary  You will also be retested for syphilis, HIV and blood levels (anemia):  You were already tested in the first trimester, but [facility] recommends retesting.  Additionally, you will be tested for Type 2 Herpes. MOST people do not know that they have genital herpes, as only around 15% of people have outbreaks.  However, it is still transmittable to other people, including the baby (but only during the birth).  If you test positive for Type 2 Herpes, we place you on a medicine called acyclovir the last 6 weeks of your pregnancy to prevent transmission of the virus to the baby during the birth.    If your sugar test is positive for gestational diabetes, you will be given an phone call and further instructions discussed.   We typically do not call patients with positive herpes results, but will discuss it at your next appointment.  If you wish to know all of your test results before your next appointment, feel free to call the office, or look up your test results on Mychart.  (The range that the lab uses for normal values of the sugar test are not necessarily the range that is used for pregnant women; if your results are within the range, they are definitely normal.  However, if a value is deemed "high" by the lab, it may not be too high for a pregnant woman.  We will need to discuss the normal range if your value(s) fall in the "high" category).     Sometime between 27 and 36 weeks, it is recommended that you and anyone who is going to be in close contact with your baby receive the Tdap booster.  You should receive it EACH pregnancy, regardless of when your last booster was.  You may go to the Health Department (no appointment necessary) or your Primary Care office to receive the vaccine.  If you do not receive the vaccine prior to delivery, it will be offered in the hospital.  However, if you get it at least 2 weeks prior to delivery, you will have the added advantage of passing the immunity to your baby.   

## 2013-10-31 ENCOUNTER — Other Ambulatory Visit: Payer: Medicaid Other

## 2013-10-31 ENCOUNTER — Ambulatory Visit (INDEPENDENT_AMBULATORY_CARE_PROVIDER_SITE_OTHER): Payer: Medicaid Other | Admitting: Advanced Practice Midwife

## 2013-10-31 VITALS — BP 108/56 | Wt 199.0 lb

## 2013-10-31 DIAGNOSIS — Z3482 Encounter for supervision of other normal pregnancy, second trimester: Secondary | ICD-10-CM

## 2013-10-31 DIAGNOSIS — Z331 Pregnant state, incidental: Secondary | ICD-10-CM

## 2013-10-31 DIAGNOSIS — Z131 Encounter for screening for diabetes mellitus: Secondary | ICD-10-CM

## 2013-10-31 DIAGNOSIS — Z1389 Encounter for screening for other disorder: Secondary | ICD-10-CM

## 2013-10-31 DIAGNOSIS — Z1159 Encounter for screening for other viral diseases: Secondary | ICD-10-CM

## 2013-10-31 DIAGNOSIS — Z0184 Encounter for antibody response examination: Secondary | ICD-10-CM

## 2013-10-31 DIAGNOSIS — Z3480 Encounter for supervision of other normal pregnancy, unspecified trimester: Secondary | ICD-10-CM

## 2013-10-31 DIAGNOSIS — F1290 Cannabis use, unspecified, uncomplicated: Secondary | ICD-10-CM

## 2013-10-31 LAB — POCT URINALYSIS DIPSTICK
Glucose, UA: NEGATIVE
Ketones, UA: NEGATIVE
Leukocytes, UA: NEGATIVE
NITRITE UA: NEGATIVE
PROTEIN UA: NEGATIVE
RBC UA: NEGATIVE

## 2013-10-31 LAB — CBC
HEMATOCRIT: 34.6 % — AB (ref 36.0–46.0)
Hemoglobin: 11.6 g/dL — ABNORMAL LOW (ref 12.0–15.0)
MCH: 30.2 pg (ref 26.0–34.0)
MCHC: 33.5 g/dL (ref 30.0–36.0)
MCV: 90.1 fL (ref 78.0–100.0)
Platelets: 225 10*3/uL (ref 150–400)
RBC: 3.84 MIL/uL — AB (ref 3.87–5.11)
RDW: 14.1 % (ref 11.5–15.5)
WBC: 11.1 10*3/uL — AB (ref 4.0–10.5)

## 2013-10-31 NOTE — Progress Notes (Signed)
[id number] [redacted]w[redacted]d Estimated Date of Delivery: 01/25/14  Blood pressure 108/56, weight 199 lb (90.266 kg), last menstrual period 04/20/2013.   BP weight and urine results all reviewed and noted.  Please refer to the obstetrical flow sheet for the fundal height and fetal heart rate documentation: Doing PN2 today  Patient reports good fetal movement, denies any bleeding and no rupture of membranes symptoms or regular contractions. Patient is without complaints. All questions were answered.  Plan:  Continued routine obstetrical care, UDS (MJ) today  Follow up in 4 weeks for OB appointment,

## 2013-11-01 LAB — DRUG SCREEN, URINE, NO CONFIRMATION
Amphetamine Screen, Ur: NEGATIVE
BARBITURATE QUANT UR: NEGATIVE
Benzodiazepines.: NEGATIVE
COCAINE METABOLITES: NEGATIVE
CREATININE, U: 146.9 mg/dL
METHADONE: NEGATIVE
Marijuana Metabolite: NEGATIVE
OPIATE SCREEN, URINE: NEGATIVE
PROPOXYPHENE: NEGATIVE
Phencyclidine (PCP): NEGATIVE

## 2013-11-01 LAB — ANTIBODY SCREEN: Antibody Screen: NEGATIVE

## 2013-11-01 LAB — GLUCOSE TOLERANCE, 2 HOURS W/ 1HR
GLUCOSE, 2 HOUR: 124 mg/dL (ref 70–139)
GLUCOSE, FASTING: 78 mg/dL (ref 70–99)
GLUCOSE: 160 mg/dL (ref 70–170)

## 2013-11-01 LAB — RPR

## 2013-11-01 LAB — HIV ANTIBODY (ROUTINE TESTING W REFLEX): HIV 1&2 Ab, 4th Generation: NONREACTIVE

## 2013-11-01 LAB — HSV 2 ANTIBODY, IGG: HSV 2 Glycoprotein G Ab, IgG: 3.49 IV — ABNORMAL HIGH

## 2013-11-10 ENCOUNTER — Emergency Department (HOSPITAL_COMMUNITY)
Admission: EM | Admit: 2013-11-10 | Discharge: 2013-11-10 | Disposition: A | Discharge status: Home / Self Care | Payer: Medicaid Other | Attending: Emergency Medicine | Admitting: Emergency Medicine

## 2013-11-10 ENCOUNTER — Encounter (HOSPITAL_COMMUNITY): Payer: Self-pay | Admitting: Emergency Medicine

## 2013-11-10 DIAGNOSIS — Z87891 Personal history of nicotine dependence: Secondary | ICD-10-CM | POA: Diagnosis not present

## 2013-11-10 DIAGNOSIS — K529 Noninfective gastroenteritis and colitis, unspecified: Secondary | ICD-10-CM

## 2013-11-10 DIAGNOSIS — O9989 Other specified diseases and conditions complicating pregnancy, childbirth and the puerperium: Secondary | ICD-10-CM | POA: Insufficient documentation

## 2013-11-10 DIAGNOSIS — Z8659 Personal history of other mental and behavioral disorders: Secondary | ICD-10-CM | POA: Diagnosis not present

## 2013-11-10 DIAGNOSIS — K5289 Other specified noninfective gastroenteritis and colitis: Secondary | ICD-10-CM | POA: Insufficient documentation

## 2013-11-10 DIAGNOSIS — Z8742 Personal history of other diseases of the female genital tract: Secondary | ICD-10-CM | POA: Diagnosis not present

## 2013-11-10 DIAGNOSIS — Z8619 Personal history of other infectious and parasitic diseases: Secondary | ICD-10-CM | POA: Insufficient documentation

## 2013-11-10 DIAGNOSIS — Z3490 Encounter for supervision of normal pregnancy, unspecified, unspecified trimester: Secondary | ICD-10-CM

## 2013-11-10 LAB — URINALYSIS, ROUTINE W REFLEX MICROSCOPIC
BILIRUBIN URINE: NEGATIVE
Glucose, UA: NEGATIVE mg/dL
Hgb urine dipstick: NEGATIVE
KETONES UR: 40 mg/dL — AB
Leukocytes, UA: NEGATIVE
NITRITE: NEGATIVE
PROTEIN: NEGATIVE mg/dL
Specific Gravity, Urine: <1.005 — ABNORMAL LOW (ref 1.005–1.030)
Urobilinogen, UA: 0.2 mg/dL (ref 0.0–1.0)
pH: 6.5 (ref 5.0–8.0)

## 2013-11-10 MED ORDER — ONDANSETRON HCL 4 MG/2ML IJ SOLN
4.0000 mg | Freq: Once | INTRAMUSCULAR | Status: AC
  Administered 2013-11-10: 4 mg via INTRAVENOUS
  Filled 2013-11-10: qty 2

## 2013-11-10 MED ORDER — PROMETHAZINE HCL 25 MG PO TABS: 25.0000 mg | ORAL_TABLET | Freq: Four times a day (QID) | ORAL | Status: DC | PRN

## 2013-11-10 MED ORDER — SODIUM CHLORIDE 0.9 % IV BOLUS (SEPSIS): 1000.0000 mL | Freq: Once | INTRAVENOUS | Status: DC

## 2013-11-10 MED ORDER — SODIUM CHLORIDE 0.9 % IV BOLUS (SEPSIS)
1000.0000 mL | Freq: Once | INTRAVENOUS | Status: AC
  Administered 2013-11-10: 1000 mL via INTRAVENOUS

## 2013-11-10 NOTE — ED Notes (Signed)
Pt reports abdominal pain,headache,n/v/d since last night. Pt reports is [redacted] weeks pregnant.

## 2013-11-10 NOTE — ED Notes (Signed)
Patient with no complaints at this time. Respirations even and unlabored. Skin warm/dry. Discharge instructions reviewed with patient at this time. Patient given opportunity to voice concerns/ask questions. IV removed per policy and band-aid applied to site. Patient discharged at this time and left Emergency Department with steady gait.  

## 2013-11-10 NOTE — Discharge Instructions (Signed)
Increase fluids. Medication for nausea. Urine sample negative. Followup your obstetrician

## 2013-11-10 NOTE — ED Provider Notes (Addendum)
CSN: [id number]     Arrival date & time 11/10/13  1101 History   First MD Initiated Contact with Patient 11/10/13 1105     This chart was scribed for Donnetta Hutching, MD by Freida Busman, ED Scribe. This patient was seen in room APA17/APA17 and the patient's care was started 11:31 AM.  Chief Complaint  Patient presents with  . Abdominal Pain     The history is provided by the patient.    HPI Comments:  [name] is a 28 y.o. female who presents to the Emergency Department complaining of moderate constant upper abdominal pain that started yesterday. Pt reports greater pain to LUQ abdomen and associated nausea and diarrhea, as well as decreased appetite. Pt is currently ~[redacted]weeks pregnant and denies h/o similar pain with prior pregnancies. Also notes current pregnancy has been without complication. No modifying factors noted. No vaginal bleeding,discharge, dysuria.   LNMP: Feb. 2015. G:4 P:3 A:0  Past Medical History  Diagnosis Date  . Irregular bleeding 01/17/2013  . Vaginal discharge 04/19/2013  . Yeast infection 04/19/2013  . Depression    Past Surgical History  Procedure Laterality Date  . Ercp N/A 09/04/2012    Procedure: ENDOSCOPIC RETROGRADE CHOLANGIOPANCREATOGRAPHY (ERCP);  Surgeon: Rachael Fee, MD;  Location: [facility] OR;  Service: Endoscopy;  Laterality: N/A;  . Cholecystectomy N/A 09/05/2012    Procedure: LAPAROSCOPIC CHOLECYSTECTOMY;  Surgeon: Emelia Loron, MD;  Location: [facility] OR;  Service: General;  Laterality: N/A;   Family History  Problem Relation Age of Onset  . CAD Maternal Grandmother   . Hypertension Mother   . Diabetes Mother   . Breast cancer Maternal Aunt    History  Substance Use Topics  . Smoking status: Former Smoker -- 0.25 packs/day for 10 years    Types: Cigarettes    Quit date: 08/29/2012  . Smokeless tobacco: Never Used  . Alcohol Use: No   OB History   Grav Para Term Preterm Abortions TAB SAB Ect Mult Living   Review  of Systems  All other systems reviewed and are negative.   10 systems reviewed and negative other than pertinent ROS in HPI     Allergies  Review of patient's allergies indicates no known allergies.  Home Medications   Prior to Admission medications   Medication Sig Start Date End Date Taking? Authorizing Provider  Prenatal Vit-Fe Sulfate-FA (PRENATAL VITAMIN PO) Take by mouth daily.   Yes Historical Provider, MD  promethazine (PHENERGAN) 25 MG tablet Take 1 tablet (25 mg total) by mouth every 6 (six) hours as needed. 11/10/13   Donnetta Hutching, MD   BP 115/30  Pulse 76  Temp(Src) 98.8 F (37.1 C) (Oral)  Resp 18  Ht  (1.626 m)  Wt 203 lb (92.08 kg)  BMI 34.83 kg/m2  SpO2 99%  LMP 04/20/2013 Physical Exam  Nursing note and vitals reviewed. Constitutional: She is oriented to person, place, and time. She appears well-developed and well-nourished.  HENT:  Head: Normocephalic and atraumatic.  Eyes: Conjunctivae and EOM are normal. Pupils are equal, round, and reactive to light.  Neck: Normal range of motion. Neck supple.  Cardiovascular: Normal rate, regular rhythm and normal heart sounds.   Pulmonary/Chest: Effort normal and breath sounds normal.  Abdominal:  Gravida Minimal upper abdomen tenderness   Musculoskeletal: Normal range of motion.  Neurological: She is alert and oriented to person, place, and time.  Skin: Skin  is warm and dry.  Psychiatric: She has a normal mood and affect. Her behavior is normal.    ED Course  Procedures (including critical care time)  DIAGNOSTIC STUDIES:  Oxygen Saturation is 99% on RA, normal by my interpretation.    COORDINATION OF CARE:  11:41 AM  Discussed treatment plan with pt at bedside and pt agreed to plan.  Labs Review Labs Reviewed  URINALYSIS, ROUTINE W REFLEX MICROSCOPIC - Abnormal; Notable for the following:    Specific Gravity, Urine <1.005 (*)    Ketones, ur 40 (*)    All other components within normal limits     Imaging Review No results found.   EKG Interpretation None      MDM   Final diagnoses:  Pregnancy  Gastroenteritis    Good fetal movement. Patient is ambulatory. No acute abdomen. Good fetal movement. She is alert with normal vital signs. Urinalysis negative. She has obstetrical followup. Discharge medications Phenergan 25 mg  I personally performed the services described in this documentation, which was scribed in my presence. The recorded information has been reviewed and is accurate.    Donnetta Hutching, MD 11/10/13 1357  Donnetta Hutching, MD 11/10/13 [phone]

## 2013-11-10 NOTE — ED Notes (Signed)
Patient reports multiple episodes of diarrhea since last night.  Nausea, but no vomiting.  Upper abdominal aching, no lower abdominal pain or urinary symptoms.

## 2013-11-28 ENCOUNTER — Ambulatory Visit (INDEPENDENT_AMBULATORY_CARE_PROVIDER_SITE_OTHER): Payer: Medicaid Other | Admitting: Women's Health

## 2013-11-28 VITALS — BP 112/60 | Wt 201.0 lb

## 2013-11-28 DIAGNOSIS — Z1389 Encounter for screening for other disorder: Secondary | ICD-10-CM

## 2013-11-28 DIAGNOSIS — Z3483 Encounter for supervision of other normal pregnancy, third trimester: Secondary | ICD-10-CM

## 2013-11-28 DIAGNOSIS — Z331 Pregnant state, incidental: Secondary | ICD-10-CM

## 2013-11-28 DIAGNOSIS — F1290 Cannabis use, unspecified, uncomplicated: Secondary | ICD-10-CM

## 2013-11-28 DIAGNOSIS — Z3480 Encounter for supervision of other normal pregnancy, unspecified trimester: Secondary | ICD-10-CM

## 2013-11-28 LAB — POCT URINALYSIS DIPSTICK
Blood, UA: NEGATIVE
Glucose, UA: NEGATIVE
KETONES UA: NEGATIVE
LEUKOCYTES UA: NEGATIVE
Nitrite, UA: NEGATIVE
Protein, UA: NEGATIVE

## 2013-11-28 NOTE — Patient Instructions (Signed)

## 2013-11-28 NOTE — Progress Notes (Signed)
Low-risk OB appointment [id number] 501w5d Estimated Date of Delivery: 01/25/14 BP 112/60  Wt 201 lb (91.173 kg)  LMP 04/20/2013  BP, weight, and urine reviewed.  Refer to obstetrical flow sheet for FH & FHR.  Reports good fm.  Denies regular uc's, lof, vb, or uti s/s. No complaints. Reviewed pn2 results, ptl s/s, fkc. Recommended Tdap at HD/PCP per CDC guidelines.  Plan:  Continue routine obstetrical care  F/U in 2wks for OB appointment and hsv2 suppression rx

## 2013-12-12 ENCOUNTER — Ambulatory Visit (INDEPENDENT_AMBULATORY_CARE_PROVIDER_SITE_OTHER): Payer: Medicaid Other | Admitting: Women's Health

## 2013-12-12 VITALS — BP 110/60 | Wt 202.5 lb

## 2013-12-12 DIAGNOSIS — Z1389 Encounter for screening for other disorder: Secondary | ICD-10-CM

## 2013-12-12 DIAGNOSIS — Z331 Pregnant state, incidental: Secondary | ICD-10-CM

## 2013-12-12 DIAGNOSIS — Z3493 Encounter for supervision of normal pregnancy, unspecified, third trimester: Secondary | ICD-10-CM

## 2013-12-12 DIAGNOSIS — R768 Other specified abnormal immunological findings in serum: Secondary | ICD-10-CM

## 2013-12-12 LAB — POCT URINALYSIS DIPSTICK
GLUCOSE UA: NEGATIVE
KETONES UA: NEGATIVE
LEUKOCYTES UA: NEGATIVE
NITRITE UA: NEGATIVE
RBC UA: NEGATIVE

## 2013-12-12 MED ORDER — ACYCLOVIR 400 MG PO TABS: 400.0000 mg | ORAL_TABLET | Freq: Three times a day (TID) | ORAL | Status: DC

## 2013-12-12 NOTE — Patient Instructions (Signed)

## 2013-12-12 NOTE — Progress Notes (Signed)
Low-risk OB appointment [id number] 245w5d Estimated Date of Delivery: 01/25/14 BP 110/60  Wt 202 lb 8 oz (91.853 kg)  LMP 04/20/2013  BP, weight, and urine reviewed.  Refer to obstetrical flow sheet for FH & FHR.  Reports good fm.  Denies regular uc's, lof, vb, or uti s/s. No complaints. Reviewed ptl s/s, fkc. Plan:  Continue routine obstetrical care  F/U in 2wks for OB appointment

## 2013-12-17 ENCOUNTER — Inpatient Hospital Stay (HOSPITAL_COMMUNITY)
Admission: AD | Admit: 2013-12-17 | Discharge: 2013-12-17 | Disposition: A | Discharge status: Home / Self Care | Payer: Medicaid Other | Source: Ambulatory Visit | Attending: Family Medicine | Admitting: Family Medicine

## 2013-12-17 ENCOUNTER — Encounter (HOSPITAL_COMMUNITY): Payer: Self-pay | Admitting: *Deleted

## 2013-12-17 DIAGNOSIS — L0291 Cutaneous abscess, unspecified: Secondary | ICD-10-CM

## 2013-12-17 DIAGNOSIS — O23592 Infection of other part of genital tract in pregnancy, second trimester: Secondary | ICD-10-CM | POA: Insufficient documentation

## 2013-12-17 DIAGNOSIS — Z3A24 24 weeks gestation of pregnancy: Secondary | ICD-10-CM | POA: Insufficient documentation

## 2013-12-17 DIAGNOSIS — L0390 Cellulitis, unspecified: Secondary | ICD-10-CM

## 2013-12-17 DIAGNOSIS — N949 Unspecified condition associated with female genital organs and menstrual cycle: Secondary | ICD-10-CM | POA: Diagnosis present

## 2013-12-17 HISTORY — DX: Herpesviral infection, unspecified: B00.9

## 2013-12-17 LAB — URINALYSIS, ROUTINE W REFLEX MICROSCOPIC
Bilirubin Urine: NEGATIVE
Glucose, UA: 250 mg/dL — AB
Hgb urine dipstick: NEGATIVE
Ketones, ur: NEGATIVE mg/dL
Leukocytes, UA: NEGATIVE
NITRITE: NEGATIVE
PH: 6 (ref 5.0–8.0)
Protein, ur: NEGATIVE mg/dL
Specific Gravity, Urine: 1.02 (ref 1.005–1.030)
Urobilinogen, UA: 1 mg/dL (ref 0.0–1.0)

## 2013-12-17 NOTE — Discharge Instructions (Signed)

## 2013-12-17 NOTE — MAU Note (Signed)
Pt states she pulled a pubic hair from the left side of her labia on Thurs.  A bump appeared last night which is very painful.  Pt states some drainage of puss.  Denies vaginal bleeding or ROM.  Good fetal movement.

## 2013-12-17 NOTE — MAU Provider Note (Cosign Needed)
History   CSN: [id number]  Arrival date and time: 12/17/13 1322   None    HPI [name] is a [age] [id number] w ega 2538w3d who presents today due to pain in her pubic area after she pulled a hair out last Thursday. She states that since then the area where the hair was has gotten swollen and painful. She states that there was a little discharge from the area, but has not noted any fever.   She denies any LOF, vaginal bleeding, contractions. +FM.  OB History   Grav Para Term Preterm Abortions TAB SAB Ect Mult Living   4 3 3       3       Past Medical History  Diagnosis Date  . Irregular bleeding 01/17/2013  . Vaginal discharge 04/19/2013  . Yeast infection 04/19/2013  . Depression   . HSV infection     Past Surgical History  Procedure Laterality Date  . Ercp N/A 09/04/2012    Procedure: ENDOSCOPIC RETROGRADE CHOLANGIOPANCREATOGRAPHY (ERCP);  Surgeon: Rachael Feeaniel P Jacobs, MD;  [facility];  Service: Endoscopy;  Laterality: N/A;  . Cholecystectomy N/A 09/05/2012    Procedure: LAPAROSCOPIC CHOLECYSTECTOMY;  Surgeon: Emelia LoronMatthew Wakefield, MD;  [facility];  Service: General;  Laterality: N/A;    Family History  Problem Relation Age of Onset  . CAD Maternal Grandmother   . Hypertension Mother   . Diabetes Mother   . Breast cancer Maternal Aunt     History  Substance Use Topics  . Smoking status: Former Smoker -- 0.25 packs/day for 10 years    Types: Cigarettes    Quit date: 08/29/2012  . Smokeless tobacco: Never Used  . Alcohol Use: No    Allergies: No Known Allergies  Prescriptions prior to admission  Medication Sig Dispense Refill  . acyclovir (ZOVIRAX) 400 MG tablet Take 1 tablet (400 mg total) by mouth 3 (three) times daily.  90 tablet  3  . Prenatal Vit-Fe Sulfate-FA (PRENATAL VITAMIN PO) Take by mouth daily.      . promethazine (PHENERGAN) 25 MG tablet Take 1 tablet (25 mg total) by mouth every 6 (six) hours as needed.  15 tablet  0    Review of Systems   Constitutional: Negative for fever and chills.  HENT: Negative.   Eyes: Negative.   Respiratory: Negative.   Cardiovascular: Negative.   Gastrointestinal: Negative.   Genitourinary: Negative.  Negative for dysuria.  Musculoskeletal: Negative.   Skin: Negative.   Neurological: Negative.   Endo/Heme/Allergies: Negative.   Psychiatric/Behavioral: Negative.   All other systems reviewed and are negative. Integumentary: May have had similar episode in same area 10 yrs ago. Denies MRSA hx.   Physical Exam   Blood pressure 124/78, pulse 104, temperature 97.8 F (36.6 C), temperature source Oral, resp. rate 18, last menstrual period 04/20/2013.  Physical Exam  Constitutional: She is oriented to person, place, and time. She appears well-developed and well-nourished.  Cardiovascular: Normal rate.   Respiratory: Effort normal.  Neurological: She is alert and oriented to person, place, and time.  Skin: Skin is warm and dry.  Firm circumspect lesion in L pubic region midway between vulva and crural fold, apx 1.5 cm diameter, w slight loculation. No erythema. No drainage.     MAU Course  Procedures Therapeutic I&D --Area was inspected and thoroughly cleaned w betadine  --Abcess was injected w apx 2cc 1% lidocaine in a circumferential fashion and allowed to become numb to prick sensation --Vertical incision which transversed length  of the abcess cut w sterile 11 scalpel blade --Purulent contents of abcess expressed w sterile gauze. Apx 0.5cc of purulent, sanguineous material expressed. --Pressure maintained until bleeding ceased.  --Area cleaned w warm water and covered w sterile gauze.  MDM   Assessment and Plan  [name] is a [age] G4P3003 w ega 6617w3d w pubic abcess, treated w therapeutic I&D.  #Abcess --Small L sided pubic abcess treated w I&D --No overlying erythema, afebrile. Abx not indicated at this time. --Close follow up in 2 days w prenatal provider or return to MAU  for wound recheck if unable to be seen in clinic.   #Gestation of 917w3d --No LOF, vaginal bleeding, contractions. +FM --FHR tracing reactive. --Continue prenatal care as previously planned.   William DaltonMcEachern, Anirudh Baiz 12/17/2013, 3:07 PM

## 2013-12-19 ENCOUNTER — Ambulatory Visit (INDEPENDENT_AMBULATORY_CARE_PROVIDER_SITE_OTHER): Payer: Medicaid Other | Admitting: Advanced Practice Midwife

## 2013-12-19 VITALS — BP 106/54 | Wt 204.5 lb

## 2013-12-19 DIAGNOSIS — Z23 Encounter for immunization: Secondary | ICD-10-CM

## 2013-12-19 DIAGNOSIS — Z1389 Encounter for screening for other disorder: Secondary | ICD-10-CM

## 2013-12-19 DIAGNOSIS — Z3483 Encounter for supervision of other normal pregnancy, third trimester: Secondary | ICD-10-CM

## 2013-12-19 DIAGNOSIS — Z331 Pregnant state, incidental: Secondary | ICD-10-CM

## 2013-12-19 LAB — POCT URINALYSIS DIPSTICK
Blood, UA: NEGATIVE
GLUCOSE UA: NEGATIVE
Ketones, UA: NEGATIVE
Leukocytes, UA: NEGATIVE
Nitrite, UA: NEGATIVE
PROTEIN UA: NEGATIVE

## 2013-12-19 NOTE — Progress Notes (Addendum)
[id number] 6775w5d Estimated Date of Delivery: 01/25/14  Blood pressure 106/54, weight 204 lb 8 oz (92.761 kg), last menstrual period 04/20/2013.   WORK IN FOR F/U MAU VISIT 10/19:  Had a furuncle I&D'd.  Feels much better.  Looks great   Patient reports good fetal movement, denies any bleeding and no rupture of membranes symptoms or regular contractions. Patient is without complaints. All questions were answered.  Plan:  Continued routine obstetrical care, flu shot today.  tdap next week Follow up 2 weeks as scheduled for OB appointment, GBS

## 2013-12-26 ENCOUNTER — Encounter: Payer: Medicaid Other | Admitting: Women's Health

## 2013-12-31 ENCOUNTER — Encounter (HOSPITAL_COMMUNITY): Payer: Self-pay | Admitting: *Deleted

## 2014-01-02 ENCOUNTER — Ambulatory Visit (INDEPENDENT_AMBULATORY_CARE_PROVIDER_SITE_OTHER): Payer: Medicaid Other | Admitting: Women's Health

## 2014-01-02 VITALS — BP 114/62 | Wt 205.0 lb

## 2014-01-02 DIAGNOSIS — Z331 Pregnant state, incidental: Secondary | ICD-10-CM

## 2014-01-02 DIAGNOSIS — Z1389 Encounter for screening for other disorder: Secondary | ICD-10-CM

## 2014-01-02 DIAGNOSIS — Z3685 Encounter for antenatal screening for Streptococcus B: Secondary | ICD-10-CM

## 2014-01-02 DIAGNOSIS — Z1159 Encounter for screening for other viral diseases: Secondary | ICD-10-CM

## 2014-01-02 DIAGNOSIS — Z118 Encounter for screening for other infectious and parasitic diseases: Secondary | ICD-10-CM

## 2014-01-02 DIAGNOSIS — Z3483 Encounter for supervision of other normal pregnancy, third trimester: Secondary | ICD-10-CM

## 2014-01-02 LAB — POCT URINALYSIS DIPSTICK
Blood, UA: NEGATIVE
Glucose, UA: NEGATIVE
LEUKOCYTES UA: NEGATIVE
NITRITE UA: NEGATIVE

## 2014-01-02 LAB — OB RESULTS CONSOLE GC/CHLAMYDIA
Chlamydia: NEGATIVE
Gonorrhea: NEGATIVE

## 2014-01-02 NOTE — Patient Instructions (Signed)
Call the office ([phone]) or go to [facility] if:  You begin to have strong, frequent contractions  Your water breaks.  Sometimes it is a big gush of fluid, sometimes it is just a trickle that keeps getting your panties wet or running down your legs  You have vaginal bleeding.  It is normal to have a small amount of spotting if your cervix was checked.   You don't feel your baby moving like normal.  If you don't, get you something to eat and drink and lay down and focus on feeling your baby move.  You should feel at least 10 movements in 2 hours.  If you don't, you should call the office or go to [facility].    Preterm Labor Information Preterm labor is when labor starts at less than 37 weeks of pregnancy. The normal length of a pregnancy is 39 to 41 weeks. CAUSES Often, there is no identifiable underlying cause as to why a woman goes into preterm labor. One of the most common known causes of preterm labor is infection. Infections of the uterus, cervix, vagina, amniotic sac, bladder, kidney, or even the lungs (pneumonia) can cause labor to start. Other suspected causes of preterm labor include:   Urogenital infections, such as yeast infections and bacterial vaginosis.   Uterine abnormalities (uterine shape, uterine septum, fibroids, or bleeding from the placenta).   A cervix that has been operated on (it may fail to stay closed).   Malformations in the fetus.   Multiple gestations (twins, triplets, and so on).   Breakage of the amniotic sac.  RISK FACTORS  Having a previous history of preterm labor.   Having premature rupture of membranes (PROM).   Having a placenta that covers the opening of the cervix (placenta previa).   Having a placenta that separates from the uterus (placental abruption).   Having a cervix that is too weak to hold the fetus in the uterus (incompetent cervix).   Having too much fluid in the amniotic sac (polyhydramnios).   Taking  illegal drugs or smoking while pregnant.   Not gaining enough weight while pregnant.   Being younger than 18 and older than 29 years old.   Having a low socioeconomic status.   Being African American. SYMPTOMS Signs and symptoms of preterm labor include:   Menstrual-like cramps, abdominal pain, or back pain.  Uterine contractions that are regular, as frequent as six in an hour, regardless of their intensity (may be mild or painful).  Contractions that start on the top of the uterus and spread down to the lower abdomen and back.   A sense of increased pelvic pressure.   A watery or bloody mucus discharge that comes from the vagina.  TREATMENT Depending on the length of the pregnancy and other circumstances, your health care provider may suggest bed rest. If necessary, there are medicines that can be given to stop contractions and to mature the fetal lungs. If labor happens before 34 weeks of pregnancy, a prolonged hospital stay may be recommended. Treatment depends on the condition of both you and the fetus.  WHAT SHOULD YOU DO IF YOU THINK YOU ARE IN PRETERM LABOR? Call your health care provider right away. You will need to go to the hospital to get checked immediately. HOW CAN YOU PREVENT PRETERM LABOR IN FUTURE PREGNANCIES? You should:   Stop smoking if you smoke.  Maintain healthy weight gain and avoid chemicals and drugs that are not necessary.  Be watchful for   any type of infection.  Inform your health care provider if you have a known history of preterm labor. Document Released: 05/08/2003 Document Revised: 10/18/2012 Document Reviewed: 03/20/2012 ExitCare Patient Information 2015 [employer]. This information is not intended to replace advice given to you by your health care provider. Make sure you discuss any questions you have with your health care provider.  

## 2014-01-02 NOTE — Progress Notes (Signed)
Low-risk OB appointment [id number] 7545w5d Estimated Date of Delivery: 01/25/14 BP 114/62 mmHg  Wt 205 lb (92.987 kg)  LMP 04/20/2013  BP, weight, and urine reviewed.  Refer to obstetrical flow sheet for FH & FHR.  Reports good fm.  Denies regular uc's, lof, vb, or uti s/s. Pressure. Large ketonuria-> to increase fluids.  GBS collected, I&D area healing well.  SVE per request: 3/50/-3, vtx Reviewed ptl s/s, fkc. Plan:  Continue routine obstetrical care  F/U in 1wk for OB appointment

## 2014-01-03 LAB — GC/CHLAMYDIA PROBE AMP
CT Probe RNA: NEGATIVE
GC Probe RNA: NEGATIVE

## 2014-01-03 LAB — STREP B DNA PROBE: STREP GROUP B AG: DETECTED

## 2014-01-09 ENCOUNTER — Encounter: Payer: Self-pay | Admitting: Advanced Practice Midwife

## 2014-01-09 ENCOUNTER — Ambulatory Visit (INDEPENDENT_AMBULATORY_CARE_PROVIDER_SITE_OTHER): Payer: Medicaid Other | Admitting: Advanced Practice Midwife

## 2014-01-09 VITALS — BP 102/78 | Wt 208.0 lb

## 2014-01-09 DIAGNOSIS — Z3483 Encounter for supervision of other normal pregnancy, third trimester: Secondary | ICD-10-CM

## 2014-01-09 DIAGNOSIS — Z331 Pregnant state, incidental: Secondary | ICD-10-CM

## 2014-01-09 DIAGNOSIS — Z1389 Encounter for screening for other disorder: Secondary | ICD-10-CM

## 2014-01-09 LAB — POCT URINALYSIS DIPSTICK
Glucose, UA: NEGATIVE
KETONES UA: NEGATIVE
LEUKOCYTES UA: NEGATIVE
Nitrite, UA: NEGATIVE
RBC UA: NEGATIVE

## 2014-01-09 NOTE — Progress Notes (Signed)
[id number] 5266w5d Estimated Date of Delivery: 01/25/14  Blood pressure 102/78, weight 208 lb (94.348 kg), last menstrual period 04/20/2013.   BP weight and urine results all reviewed and noted.  Please refer to the obstetrical flow sheet for the fundal height and fetal heart rate documentation:  Patient reports good fetal movement, denies any bleeding and no rupture of membranes symptoms or regular contractions. Patient is without complaints. All questions were answered.  Plan:  Continued routine obstetrical care,   Follow up in 1 weeks for OB appointment,

## 2014-01-15 ENCOUNTER — Telehealth: Payer: Self-pay | Admitting: Women's Health

## 2014-01-15 NOTE — Telephone Encounter (Signed)
Pt states she is having a sore throat, cough and congestion.  Reports good fetal movement, no vaginal bleeding and no cramping.  Advised pt to take Tylenol, Robitussin Mucus and Chest congestion and can use Cepacol throat lozenges, push fluids and rest when can.  Advised if symptoms get worse or no improvement to call us back and we would make her an appointment to be seen, pt verbalized understanding.

## 2014-01-16 ENCOUNTER — Inpatient Hospital Stay (HOSPITAL_COMMUNITY)
Admission: AD | Admit: 2014-01-16 | Discharge: 2014-01-18 | DRG: 775 | Disposition: A | Discharge status: Home / Self Care | Payer: Medicaid Other | Source: Ambulatory Visit | Attending: Obstetrics & Gynecology | Admitting: Obstetrics & Gynecology

## 2014-01-16 ENCOUNTER — Encounter (HOSPITAL_COMMUNITY): Payer: Self-pay | Admitting: *Deleted

## 2014-01-16 DIAGNOSIS — Z833 Family history of diabetes mellitus: Secondary | ICD-10-CM | POA: Diagnosis not present

## 2014-01-16 DIAGNOSIS — Z8249 Family history of ischemic heart disease and other diseases of the circulatory system: Secondary | ICD-10-CM | POA: Diagnosis not present

## 2014-01-16 DIAGNOSIS — O99824 Streptococcus B carrier state complicating childbirth: Secondary | ICD-10-CM | POA: Diagnosis present

## 2014-01-16 DIAGNOSIS — O471 False labor at or after 37 completed weeks of gestation: Secondary | ICD-10-CM | POA: Diagnosis present

## 2014-01-16 DIAGNOSIS — Z3483 Encounter for supervision of other normal pregnancy, third trimester: Secondary | ICD-10-CM

## 2014-01-16 DIAGNOSIS — Z803 Family history of malignant neoplasm of breast: Secondary | ICD-10-CM

## 2014-01-16 DIAGNOSIS — Z3A38 38 weeks gestation of pregnancy: Secondary | ICD-10-CM | POA: Diagnosis present

## 2014-01-16 DIAGNOSIS — Z87891 Personal history of nicotine dependence: Secondary | ICD-10-CM

## 2014-01-16 LAB — CBC
HCT: 37.3 % (ref 36.0–46.0)
Hemoglobin: 12.8 g/dL (ref 12.0–15.0)
MCH: 31.2 pg (ref 26.0–34.0)
MCHC: 34.3 g/dL (ref 30.0–36.0)
MCV: 91 fL (ref 78.0–100.0)
PLATELETS: 171 10*3/uL (ref 150–400)
RBC: 4.1 MIL/uL (ref 3.87–5.11)
RDW: 14 % (ref 11.5–15.5)
WBC: 16 10*3/uL — ABNORMAL HIGH (ref 4.0–10.5)

## 2014-01-16 LAB — RPR

## 2014-01-16 MED ORDER — DIBUCAINE 1 % RE OINT: 1.0000 "application " | TOPICAL_OINTMENT | RECTAL | Status: DC | PRN

## 2014-01-16 MED ORDER — ONDANSETRON HCL 4 MG/2ML IJ SOLN: 4.0000 mg | Freq: Four times a day (QID) | INTRAMUSCULAR | Status: DC | PRN

## 2014-01-16 MED ORDER — BENZOCAINE-MENTHOL 20-0.5 % EX AERO: 1.0000 "application " | INHALATION_SPRAY | CUTANEOUS | Status: DC | PRN

## 2014-01-16 MED ORDER — LACTATED RINGERS IV SOLN
INTRAVENOUS | Status: DC
  Administered 2014-01-16: 04:00:00 via INTRAVENOUS

## 2014-01-16 MED ORDER — LIDOCAINE HCL (PF) 1 % IJ SOLN
30.0000 mL | INTRAMUSCULAR | Status: DC | PRN
  Filled 2014-01-16: qty 30

## 2014-01-16 MED ORDER — SIMETHICONE 80 MG PO CHEW: 80.0000 mg | CHEWABLE_TABLET | ORAL | Status: DC | PRN

## 2014-01-16 MED ORDER — FLEET ENEMA 7-19 GM/118ML RE ENEM: 1.0000 | ENEMA | RECTAL | Status: DC | PRN

## 2014-01-16 MED ORDER — CITRIC ACID-SODIUM CITRATE 334-500 MG/5ML PO SOLN: 30.0000 mL | ORAL | Status: DC | PRN

## 2014-01-16 MED ORDER — DIPHENHYDRAMINE HCL 25 MG PO CAPS: 25.0000 mg | ORAL_CAPSULE | Freq: Four times a day (QID) | ORAL | Status: DC | PRN

## 2014-01-16 MED ORDER — ACETAMINOPHEN 325 MG PO TABS: 650.0000 mg | ORAL_TABLET | ORAL | Status: DC | PRN

## 2014-01-16 MED ORDER — LANOLIN HYDROUS EX OINT: TOPICAL_OINTMENT | CUTANEOUS | Status: DC | PRN

## 2014-01-16 MED ORDER — ONDANSETRON HCL 4 MG PO TABS: 4.0000 mg | ORAL_TABLET | ORAL | Status: DC | PRN

## 2014-01-16 MED ORDER — OXYCODONE-ACETAMINOPHEN 5-325 MG PO TABS
1.0000 | ORAL_TABLET | ORAL | Status: DC | PRN
  Administered 2014-01-16 (×2): 1 via ORAL
  Filled 2014-01-16 (×5): qty 1

## 2014-01-16 MED ORDER — OXYCODONE-ACETAMINOPHEN 5-325 MG PO TABS
1.0000 | ORAL_TABLET | ORAL | Status: DC | PRN
  Administered 2014-01-16: 1 via ORAL
  Filled 2014-01-16: qty 1

## 2014-01-16 MED ORDER — FLEET ENEMA 7-19 GM/118ML RE ENEM: 1.0000 | ENEMA | Freq: Every day | RECTAL | Status: DC | PRN

## 2014-01-16 MED ORDER — SODIUM CHLORIDE 0.9 % IV SOLN
250.0000 mL | INTRAVENOUS | Status: DC | PRN
Start: 2014-01-16 — End: 2014-01-17

## 2014-01-16 MED ORDER — PRENATAL MULTIVITAMIN CH
1.0000 | ORAL_TABLET | Freq: Every day | ORAL | Status: DC
  Administered 2014-01-16 – 2014-01-17 (×2): 1 via ORAL
  Filled 2014-01-16 (×2): qty 1

## 2014-01-16 MED ORDER — LACTATED RINGERS IV SOLN: 500.0000 mL | INTRAVENOUS | Status: DC | PRN

## 2014-01-16 MED ORDER — OXYTOCIN 40 UNITS IN LACTATED RINGERS INFUSION - SIMPLE MED: 62.5000 mL/h | INTRAVENOUS | Status: DC | PRN

## 2014-01-16 MED ORDER — OXYCODONE-ACETAMINOPHEN 5-325 MG PO TABS: 2.0000 | ORAL_TABLET | ORAL | Status: DC | PRN

## 2014-01-16 MED ORDER — ONDANSETRON HCL 4 MG/2ML IJ SOLN
4.0000 mg | INTRAMUSCULAR | Status: DC | PRN
Start: 2014-01-16 — End: 2014-01-18

## 2014-01-16 MED ORDER — OXYTOCIN 40 UNITS IN LACTATED RINGERS INFUSION - SIMPLE MED
INTRAVENOUS | Status: AC
  Filled 2014-01-16: qty 1000

## 2014-01-16 MED ORDER — IBUPROFEN 600 MG PO TABS
600.0000 mg | ORAL_TABLET | Freq: Four times a day (QID) | ORAL | Status: DC
  Administered 2014-01-16 – 2014-01-18 (×10): 600 mg via ORAL
  Filled 2014-01-16 (×10): qty 1

## 2014-01-16 MED ORDER — WITCH HAZEL-GLYCERIN EX PADS: 1.0000 "application " | MEDICATED_PAD | CUTANEOUS | Status: DC | PRN

## 2014-01-16 MED ORDER — OXYTOCIN 40 UNITS IN LACTATED RINGERS INFUSION - SIMPLE MED
62.5000 mL/h | INTRAVENOUS | Status: DC
  Administered 2014-01-16: 62.5 mL/h via INTRAVENOUS

## 2014-01-16 MED ORDER — SENNOSIDES-DOCUSATE SODIUM 8.6-50 MG PO TABS
2.0000 | ORAL_TABLET | ORAL | Status: DC
  Administered 2014-01-16 – 2014-01-17 (×2): 2 via ORAL
  Filled 2014-01-16 (×2): qty 2

## 2014-01-16 MED ORDER — SODIUM CHLORIDE 0.9 % IJ SOLN: 3.0000 mL | INTRAMUSCULAR | Status: DC | PRN

## 2014-01-16 MED ORDER — SODIUM CHLORIDE 0.9 % IV SOLN
2.0000 g | Freq: Once | INTRAVENOUS | Status: DC
  Filled 2014-01-16: qty 2000

## 2014-01-16 MED ORDER — ZOLPIDEM TARTRATE 5 MG PO TABS: 5.0000 mg | ORAL_TABLET | Freq: Every evening | ORAL | Status: DC | PRN

## 2014-01-16 MED ORDER — OXYTOCIN BOLUS FROM INFUSION
500.0000 mL | INTRAVENOUS | Status: DC
  Administered 2014-01-16: 500 mL via INTRAVENOUS

## 2014-01-16 MED ORDER — LIDOCAINE HCL (PF) 1 % IJ SOLN
INTRAMUSCULAR | Status: AC
  Filled 2014-01-16: qty 30

## 2014-01-16 MED ORDER — SODIUM CHLORIDE 0.9 % IJ SOLN: 3.0000 mL | Freq: Two times a day (BID) | INTRAMUSCULAR | Status: DC

## 2014-01-16 MED ORDER — OXYCODONE-ACETAMINOPHEN 5-325 MG PO TABS
1.0000 | ORAL_TABLET | ORAL | Status: DC | PRN
  Administered 2014-01-16: 1 via ORAL
  Administered 2014-01-16 – 2014-01-18 (×6): 2 via ORAL
  Filled 2014-01-16 (×5): qty 2

## 2014-01-16 MED ORDER — FENTANYL CITRATE 0.05 MG/ML IJ SOLN: 100.0000 ug | Freq: Once | INTRAMUSCULAR | Status: DC

## 2014-01-16 MED ORDER — BISACODYL 10 MG RE SUPP: 10.0000 mg | Freq: Every day | RECTAL | Status: DC | PRN

## 2014-01-16 NOTE — Progress Notes (Signed)
Let patient know I would be asking her questions about her medical history and pt said we already had her information and I let her know I still had to verify the information.  Pt's family member and significant other left the room and I proceeded to ask her medical questions.

## 2014-01-16 NOTE — Progress Notes (Signed)
Time for 0508 charting: Let patient know I would be asking her questions about her medical history and pt said we already had her information and I let her know I still had to verify the information. Pt's family member and significant other left the room and I proceeded to ask her medical questions.          should be for 0325 MAU note.

## 2014-01-16 NOTE — Progress Notes (Signed)
UR chart review completed.  

## 2014-01-16 NOTE — H&P (Signed)
LABOR ADMISSION HISTORY AND PHYSICAL  [name] is a 28 y.o. female [phone] with IUP at 6524w5d by 8558w6d presenting in active labor. HSV: on ppx, last outbreak 10 years ago  Dating: By 5558w6d sono --->  Estimated Date of Delivery: 01/25/14  Prenatal History/Complications:  Past Medical History: Past Medical History  Diagnosis Date  . Irregular bleeding 01/17/2013  . Vaginal discharge 04/19/2013  . Yeast infection 04/19/2013  . Depression   . HSV infection     Past Surgical History: Past Surgical History  Procedure Laterality Date  . Ercp N/A 09/04/2012    Procedure: ENDOSCOPIC RETROGRADE CHOLANGIOPANCREATOGRAPHY (ERCP);  Surgeon: Sonia Feeaniel P Jacobs, MD;  [facility];  Service: Endoscopy;  Laterality: N/A;  . Cholecystectomy N/A 09/05/2012    Procedure: LAPAROSCOPIC CHOLECYSTECTOMY;  Surgeon: Sonia LoronMatthew Wakefield, MD;  Location: [facility] OR;  Service: General;  Laterality: N/A;    Obstetrical History: OB History    Gravida Para Term Preterm AB TAB SAB Ectopic Multiple Living   4 3 3       3       Social History: History   Social History  . Marital Status: Single    Spouse Name: N/A    Number of Children: N/A  . Years of Education: N/A   Social History Main Topics  . Smoking status: Former Smoker -- 0.25 packs/day for 10 years    Types: Cigarettes    Quit date: 08/29/2012  . Smokeless tobacco: Never Used  . Alcohol Use: No  . Drug Use: No     Comment: pt denies  . Sexual Activity: Yes    Birth Control/ Protection: None   Other Topics Concern  . None   Social History Narrative    Family History: Family History  Problem Relation Age of Onset  . CAD Maternal Grandmother   . Hypertension Mother   . Diabetes Mother   . Breast cancer Maternal Aunt     Allergies: No Known Allergies  Prescriptions prior to admission  Medication Sig Dispense Refill Last Dose  . acyclovir (ZOVIRAX) 400 MG tablet Take 1 tablet (400 mg total) by mouth 3 (three) times daily. 90 tablet  3 01/15/2014 at Unknown time  . Prenatal Vit-Fe Sulfate-FA (PRENATAL VITAMIN PO) Take by mouth daily.   01/15/2014 at Unknown time  . promethazine (PHENERGAN) 25 MG tablet Take 1 tablet (25 mg total) by mouth every 6 (six) hours as needed. 15 tablet 0 Taking     Review of Systems   All systems reviewed and negative except as stated in HPI  Blood pressure 144/100, pulse 89, temperature 98.4 F (36.9 C), temperature source Oral, resp. rate 20, height 5\' 2"  (1.575 m), weight 208 lb 8 oz (94.575 kg), last menstrual period 04/20/2013. General appearance: alert, cooperative and moderate distress Lungs: clear to auscultation bilaterally Heart: regular rate and rhythm Abdomen: soft, non-tender; bowel sounds normal Extremities: Homans sign is negative, no sign of DVT Dilation: 10 Effacement (%): 100 Station: 0, +1 Exam by:: Sonia MallowKlashley, RN   Prenatal labs: ABO, Rh: A/POS/-- (04/21 1508) Antibody: NEG (09/02 0955) Rubella:   RPR: NON REAC (09/02 0955)  HBsAg: NEGATIVE (04/21 1508)  HIV: NONREACTIVE (09/02 0955)  GBS: Detected (11/04 1134)  2 hr Glucola 78/160/124 Genetic screening  neg Anatomy US normal   [facility]  FOB [name], [place]33yo, black, 5th baby   Dating By LMP c/w 7wk u/s  Pap 04/24/12: neg  GC/CT Initial:   -/-  36+wks:   -/-  Genetic Screen NT/IT: neg  CF screen neg  Anatomic US Normal female  Flu vaccine 12/19/2013  Tdap Recommended ~ 28wks  Glucose Screen  2 hr  78/160/124  GBS Pos  Feed Preference bottle  Contraception COCs  Circumcision Yes, if boy  Childbirth Classes declined  Pediatrician Undecided, info given      Results for orders placed or performed during the hospital encounter of 01/16/14 (from the past 24 hour(s))  CBC   Collection Time: 01/16/14  3:50 AM  Result Value Ref Range   WBC 16.0 (H) 4.0 - 10.5 K/uL   RBC 4.10 3.87 - 5.11 MIL/uL   Hemoglobin 12.8 12.0 - 15.0 g/dL   HCT 69.637.3 29.536.0 - 28.446.0 %   MCV 91.0 78.0 -  100.0 fL   MCH 31.2 26.0 - 34.0 pg   MCHC 34.3 30.0 - 36.0 g/dL   RDW 13.214.0 44.011.5 - 10.215.5 %   Platelets 171 150 - 400 K/uL    Patient Active Problem List   Diagnosis Date Noted  . Normal labor 01/16/2014  . Marijuana use 06/20/2013  . Supervision of other normal pregnancy 06/19/2013  . S/P cholecystectomy 06/19/2013  . 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 Yeast infection 04/19/2013  . Irregular bleeding 01/17/2013  . Hypokalemia 09/02/2012    Assessment: [name] is a 28 y.o. G4P3003 at 6973w5d here for active labor  #Labor:eminent delivery #Pain: none #FWB: Cat I #ID:  GBS +, PCN.  HSV: on ppx, no outbreak #MOF: bottle #MOC:declines contraception #Circ:  N/a #soc: hx of THC, reports last used in early pregnancy  SoniaKRISTY Montgomery 01/16/2014, 4:18 AM

## 2014-01-16 NOTE — Plan of Care (Signed)
Problem: Phase I Progression Outcomes Goal: Foley catheter patent Outcome: Completed/Met Date Met:  01/16/14 Goal: OOB as tolerated unless otherwise ordered Outcome: Completed/Met Date Met:  01/16/14 Goal: IS, TCDB as ordered Outcome: Completed/Met Date Met:  01/16/14 Goal: VS, stable, temp < 100.4 degrees F Outcome: Completed/Met Date Met:  01/16/14 Goal: Initial discharge plan identified Outcome: Completed/Met Date Met:  01/16/14 Goal: Other Phase I Outcomes/Goals Outcome: Completed/Met Date Met:  01/16/14     

## 2014-01-16 NOTE — Progress Notes (Signed)
Post Partum Day #0 Subjective: up ad lib, voiding and some vaginal pain  Objective: Blood pressure 120/62, pulse 97, temperature 98.8 F (37.1 C), temperature source Oral, resp. rate 18, height 5\' 2"  (1.575 m), weight 94.348 kg (208 lb), last menstrual period 04/20/2013, unknown if currently breastfeeding.  Physical Exam:  General: alert and cooperative Lochia: appropriate Uterine Fundus: firm Incision: N/A DVT Evaluation: No evidence of DVT seen on physical exam. Negative Homan's sign.   Recent Labs  01/16/14 0350  HGB 12.8  HCT 37.3    Assessment/Plan: Plan for discharge tomorrow; bottle feed only; Contraception: None   LOS: 0 days   Jacquelin Hawkingettey, Ralph 01/16/2014, 7:36 AM   Evaluation and management procedures were performed by Resident physician under my supervision/collaboration. Chart reviewed, patient examined by me and I agree with management and plan.

## 2014-01-16 NOTE — MAU Note (Signed)
PT  SAYS UC   HURT BAD  10PM.  WAS IN OFFICE 2 WEEKS   AGO-  VE  2 CM.  GBS- POS.  DENIES HSV AND MRSA.

## 2014-01-16 NOTE — Plan of Care (Signed)
Problem: Phase I Progression Outcomes Goal: Voiding adequately Outcome: Completed/Met Date Met:  01/16/14

## 2014-01-16 NOTE — Plan of Care (Signed)
Problem: Phase I Progression Outcomes Goal: Pain controlled with appropriate interventions Outcome: Completed/Met Date Met:  28-Jun-2013

## 2014-01-16 NOTE — Progress Notes (Signed)
Clinical Social Work Department PSYCHOSOCIAL ASSESSMENT - MATERNAL/CHILD 01/16/2014  Patient:  [name], [name]  Account Number:  [id number]  Admit Date:  01/16/2014  Ardine Eng Name:   Lizbeth Bark   Clinical Social Worker:  Lucita Ferrara, CLINICAL SOCIAL WORKER   Date/Time:  01/16/2014 01:15 PM  Date Referred:  01/16/2014   Referral source  Central Nursery     Referred reason  Depression/Anxiety  Substance Abuse   Other referral source:    I:  FAMILY / HOME ENVIRONMENT Child's legal guardian:  PARENT  Guardian - Name Guardian - Age Guardian - Address  [name] [address] [place], [place] [address]  [place]  different residence   Other household support members/support persons Name Relationship DOB   SON 2006   DAUGHTER 2007   Quinlan 2011   Other support:   MOB stated that she is "very supported" but did not identify specific people.    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 She reported receiving leave from work, and discussed intention to return to work in the postpartum period.   Financial resources:  Medicaid If Medicaid - County:  [employer] / Grade:  N/A Music therapist / Child Services Coordination / Early Interventions:   None reported.  Cultural issues impacting care:   None reported.    III  STRENGTHS Strengths  Adequate Resources  Home prepared for Child (including basic supplies)  Supportive family/friends   Strength comment:    IV  RISK FACTORS AND CURRENT PROBLEMS Current Problem:  YES   Risk Factor & Current Problem Patient Issue Family Issue Risk Factor / Current Problem Comment  Mental Illness Y N MOB presents with a mental health history signficiant for depression.  MOB reported diagnosis in 2008, with history of Zoloft.  MOB stated that medications were discontinued a year ago due to stabilization of symptoms.   Substance Abuse Y N MOB's UDS positive for THC in April.  MOB reported experiemental use that resulted in this UDS.  Baby's UDS and MDS pending.    V  SOCIAL WORK ASSESSMENT CSW met with the MOB in her room in order to complete the assessment. Consult was ordered due to MOB presenting with a history of depression and THC use during pregnancy.  MOB displayed a bright and cheerful affect, and presented in a pleasant mood.  MOB was receptive to the visit, and expressed appreciation for the visit.  MOB willingly answered questions, but also was vague and lacking detail at times when CSW attempted to clarify mental health and substance use history.  MOB did not present with any acute mental health symptoms and was observed to be attentive and bonding with the baby.   CSW guided the MOB to process her thoughts and feelings as she transitions into the postpartum period.  She shared that she is excited, the home is prepared, and that she feels well supported by her family.  She stated that her other children are also excited, and that she is looking forward to them meeting with the baby this afternoon.  MOB denied any concerns about normative behaviors that they may exhibit as they adjust to the baby, and shared belief that it will be a smooth transition.  MOB reported stress secondary to having four children, but stated that she believes she will be able to cope well since her other children are "well behaved" and fairly  independent.  MOB denied any acute psychosocial stressors during her pregnancy that may negatively impact her transition into the postpartum period.   MOB acknowledged history of depression.  She reported that she received the diagnosis in 2008, and received Zoloft to control her symptoms. She shared belief that Zoloft assisted her to control her symptoms.  MOB endorsed situational depression since she recently had ended a relationship and felt overwhelmed by being a single parent, going to  school, and working at the same time.  MOB stated that the Zoloft was discontinued about one year ago due to stabilization of symptoms.  CSW attempted to explore previous coping skills that were effective in controlling her symptoms, but MOB stated that the Zoloft was primary reason that symptoms stabilized. She denied any symptoms of depression or anxiety during the pregnancy.  MOB denied history of postpartum depression, but maintained eye contact and nodded in agreement as CSW provided education on postpartum depression.   CSW inquired about substance use.  MOB acknowledged THC use, but stated that it was "experimental".  She recalled that she only used one time in March, and stated that it was before she knew that she was pregnant.  MOB expressed awareness of her UDS being positive for [facility] in April, and denied any other substance use or THC use.  MOB verbalized understanding of hospital drug screen policy, and denied any other concerns.  MOB expressed belief that UDS and MDS will be negative for the baby.   No barriers to discharge.   VI SOCIAL WORK PLAN Social Work Therapist, art  No Further Intervention Required / No Barriers to Discharge   Type of pt/family education:   Postpartum depression  Hospital drug screen policy   If child protective services report - county:   If child protective services report - date:   Information/referral to community resources comment:   No referrals needed at this time.   Other social work plan:   CSW to follow up PRN.  CSW to monitor UDS and MDS and will CPS report if warranted.

## 2014-01-16 NOTE — MAU Note (Signed)
STILL IN B-ROOM-  SAYS OK

## 2014-01-17 ENCOUNTER — Encounter: Payer: Medicaid Other | Admitting: Advanced Practice Midwife

## 2014-01-17 NOTE — Progress Notes (Addendum)
Post Partum Day #1 Subjective: S/p BTL, POD #1. up ad lib, voiding and tolerating PO. Has abdominal pain mainly from BTL surgery.  Objective: Blood pressure 114/60, pulse 80, temperature 97.3 F (36.3 C), temperature source Oral, resp. rate 18, height 5\' 2"  (1.575 m), weight 94.348 kg (208 lb), last menstrual period 04/20/2013, SpO2 95 %, unknown if currently breastfeeding.  Physical Exam:  General: alert and cooperative Lochia: appropriate Uterine Fundus: firm Abdomen: LLQ and RLQ tenderness, no rebound or guarding Incision: n/a DVT Evaluation: No evidence of DVT seen on physical exam.   Recent Labs  01/16/14 0350  HGB 12.8  HCT 37.3    Assessment/Plan: Plan for discharge tomorrow; breastfeeding   LOS: 1 day   Jacquelin Hawkingettey, Ralph 01/17/2014, 7:47 AM   `````Attestation of Attending Supervision of Advanced Practitioner: Evaluation and management procedures were performed by the PA/NP/CNM/OB Fellow under my supervision/collaboration. Chart reviewed and agree with management and plan.  FERGUSON,JOHN V 01/17/2014 8:32 AM   I have seen and examined this patient and I agree with the above. Cam HaiSHAW, Eshaal Duby CNM 8:53 AM 01/17/2014

## 2014-01-17 NOTE — Plan of Care (Signed)
Problem: Consults Goal: Postpartum Patient Education (See Patient Education module for education specifics.)  Outcome: Completed/Met Date Met:  01/17/14

## 2014-01-18 MED ORDER — IBUPROFEN 600 MG PO TABS: 600.0000 mg | ORAL_TABLET | Freq: Four times a day (QID) | ORAL | Status: DC

## 2014-01-18 NOTE — Discharge Summary (Signed)
Obstetric Discharge Summary Reason for Admission: onset of labor Prenatal Procedures: ultrasound Intrapartum Procedures: spontaneous vaginal delivery Postpartum Procedures: none Complications-Operative and Postpartum: none  Boyfriend had a vasectomy  HEMOGLOBIN  Date Value Ref Range Status  01/16/2014 12.8 12.0 - 15.0 g/dL Final   HCT  Date Value Ref Range Status  01/16/2014 37.3 36.0 - 46.0 % Final    Physical Exam:  General: alert, cooperative and no distress Lochia: appropriate Uterine Fundus: firm Incision: n/a  DVT Evaluation: No evidence of DVT seen on physical exam.  Discharge Diagnoses: Term Pregnancy-delivered  Discharge Information: Date: 01/18/2014 Activity: pelvic rest Diet: routine Medications: None Condition: stable Instructions: refer to practice specific booklet Discharge to: home   Newborn Data: Live born female  Birth Weight: 7 lb 5 oz (3317 g) APGAR: 8, 9  Home with mother.  CRESENZO-DISHMAN,Haron Beilke 01/18/2014, 7:28 AM

## 2014-01-18 NOTE — Plan of Care (Signed)
Problem: Discharge Progression Outcomes Goal: Remove staples per MD order Outcome: Not Applicable Date Met:  01/18/14     

## 2014-01-18 NOTE — Plan of Care (Signed)
Problem: Discharge Progression Outcomes Goal: MMR given as ordered Outcome: Not Applicable Date Met:  83/81/84

## 2014-01-18 NOTE — Discharge Instructions (Signed)

## 2014-01-18 NOTE — Plan of Care (Signed)
Problem: Consults Goal: Postpartum Patient Education (See Patient Education module for education specifics.)  Outcome: Completed/Met Date Met:  01/18/14     

## 2014-01-21 ENCOUNTER — Ambulatory Visit (INDEPENDENT_AMBULATORY_CARE_PROVIDER_SITE_OTHER): Payer: Medicaid Other | Admitting: Women's Health

## 2014-01-21 ENCOUNTER — Encounter: Payer: Self-pay | Admitting: Women's Health

## 2014-01-21 ENCOUNTER — Telehealth: Payer: Self-pay | Admitting: Women's Health

## 2014-01-21 VITALS — BP 122/70 | Ht 64.0 in | Wt 197.0 lb

## 2014-01-21 DIAGNOSIS — R52 Pain, unspecified: Secondary | ICD-10-CM

## 2014-01-21 DIAGNOSIS — R300 Dysuria: Secondary | ICD-10-CM

## 2014-01-21 DIAGNOSIS — M545 Low back pain, unspecified: Secondary | ICD-10-CM

## 2014-01-21 DIAGNOSIS — O9089 Other complications of the puerperium, not elsewhere classified: Secondary | ICD-10-CM

## 2014-01-21 LAB — POCT URINALYSIS DIPSTICK
Glucose, UA: NEGATIVE
Ketones, UA: NEGATIVE
NITRITE UA: NEGATIVE

## 2014-01-21 MED ORDER — OXYCODONE-ACETAMINOPHEN 5-325 MG PO TABS: 1.0000 | ORAL_TABLET | ORAL | Status: DC | PRN

## 2014-01-21 NOTE — Telephone Encounter (Signed)
Pt states did have a small tear from her delivery on 11/18, call transferred to front staff for an appt to be scheduled today or tomorrow per Joellyn HaffKim Booker, CNM.

## 2014-01-21 NOTE — Patient Instructions (Signed)
For your lower back pain you may:  Take warm showers- let water directly hit lower back  Use a heating pad to your lower back for no longer than 20 minutes at a time  Take ibuprofen as needed. Please follow directions on the bottle/prescription  Cold cabbage leaves, snug bras, don't let water directly hit breasts in shower

## 2014-01-21 NOTE — Progress Notes (Signed)
Patient ID: [name], female   DOB: 02-May-1985, 28 y.o.   MRN: [id number]   [facility]  Patient name: [name] MRN [id number]  Date of birth: 02-May-1985  CC & HPI:  [name] is a 28 y.o. [phone] African American female 5d s/p uncomplicated SVB w/o lacs, presenting today for report of constant LBP- worsens when cramping worsens, constant vaginal pressure, bad cramps that ibuprofen is not controlling- does help some. Was having to take 2 percocet q 4hr in hospital. Did not have epidural or pain meds prior to delivery d/t precipitous labor. Lochia normal, no complications urinating or having bm's. No abnormal or malodorous d/c or itching/irritation. Bottlefeeding, inquiring about ways to help milk dry up.  Pertinent History Reviewed:  Medical & Surgical Hx:   Past Medical History  Diagnosis Date  . Irregular bleeding 01/17/2013  . Vaginal discharge 04/19/2013  . Yeast infection 04/19/2013  . Depression   . HSV infection    Past Surgical History  Procedure Laterality Date  . Ercp N/A 09/04/2012    Procedure: ENDOSCOPIC RETROGRADE CHOLANGIOPANCREATOGRAPHY (ERCP);  Surgeon: Rachael Feeaniel P Jacobs, MD;  [facility];  Service: Endoscopy;  Laterality: N/A;  . Cholecystectomy N/A 09/05/2012    Procedure: LAPAROSCOPIC CHOLECYSTECTOMY;  Surgeon: Emelia LoronMatthew Wakefield, MD;  [facility];  Service: General;  Laterality: N/A;   Medications: Reviewed & Updated - see associated section Social History: Reviewed -  reports that she quit smoking about 16 months ago. Her smoking use included Cigarettes. She has a 2.5 pack-year smoking history. She has never used smokeless tobacco.  Objective Findings:  Vitals: BP 122/70 mmHg  Ht 5\' 4"  (1.626 m)  Wt 197 lb (89.359 kg)  BMI 33.80 kg/m2  Breastfeeding? No  Physical Examination: General appearance - alert, well appearing, and in no distress Pelvic - No signs of hematoma, no lacs, normal lochia rubra  Results for  orders placed or performed in visit on 01/21/14 (from the past 24 hour(s))  POCT Urinalysis Dipstick   Collection Time: 01/21/14  3:53 PM  Result Value Ref Range   Color, UA Amber (A) Light Yellow, Yellow   Clarity, UA cloudy    Glucose, UA neg    Bilirubin, UA     Ketones, UA neg    Spec Grav, UA     Blood, UA large    pH, UA     Protein, UA trace    Urobilinogen, UA     Nitrite, UA neg    Leukocytes, UA moderate (2+)      Assessment & Plan:  A:   5d s/p uncomplicated SVB  Cramps/LBP/pelvic pressure uncontrolled w/ ibuprofen  Bottlefeeding P:  Continue ibuprofen as rx'd  Rx percocet 5/325 #20 1 q 4hr prn severe pain not controlled by ibuprofen  Can try supine position for bad cramping  Will send urine cx  Warm showers, heating pad, good posture for LBP  Cold cabbage leaves, snug bra, don't let water directly hit breasts in shower to help dry up milk   F/U 12/31 as scheduled for pp visit   Marge DuncansBooker, Danie Diehl Randall CNM, [facility] 01/21/2014 4:14 PM

## 2014-01-21 NOTE — Telephone Encounter (Signed)
Pt states had a vaginal birth on 11/18 and is having vaginal pain, she is using the Motrin 600mg  that was Rx'd at time of discharge from hospital with no relief and is [facility] [place].  Please advise.

## 2014-01-23 ENCOUNTER — Telehealth: Payer: Self-pay | Admitting: Women's Health

## 2014-01-23 LAB — URINE CULTURE: Colony Count: 70000

## 2014-01-23 MED ORDER — AMPICILLIN 500 MG PO CAPS: 500.0000 mg | ORAL_CAPSULE | Freq: Four times a day (QID) | ORAL | Status: DC

## 2014-01-23 NOTE — Telephone Encounter (Signed)
Notified of uti, rx ampicillin.  Cheral MarkerKimberly R. Revere Maahs, CNM, [facility]NP[facility]BC 01/23/2014 11:16 AM

## 2014-02-28 ENCOUNTER — Ambulatory Visit: Payer: Medicaid Other | Admitting: Advanced Practice Midwife

## 2014-03-07 ENCOUNTER — Ambulatory Visit: Payer: Medicaid Other | Admitting: Advanced Practice Midwife

## 2014-03-12 ENCOUNTER — Ambulatory Visit (INDEPENDENT_AMBULATORY_CARE_PROVIDER_SITE_OTHER): Payer: Medicaid Other | Admitting: Advanced Practice Midwife

## 2014-03-12 ENCOUNTER — Encounter: Payer: Self-pay | Admitting: Advanced Practice Midwife

## 2014-03-12 NOTE — Progress Notes (Signed)
  Sonia BrinkShavonne J Caligiuri is a 29 y.o. who presents for a postpartum visit. She is 7 weeks postpartum following a spontaneous vaginal delivery. I have fully reviewed the prenatal and intrapartum course. The delivery was at 38.5 gestational weeks.  Anesthesia: none. Postpartum course has been unevenful Baby's course has been uneventful. Baby is feeding by bottle. Bleeding: no bleeding. Bowel function is normal. Bladder function is normal. Patient is sexually active. Contraception method is vasectomy. Postpartum depression screening: negative.  Current outpatient prescriptions: ibuprofen (ADVIL,MOTRIN) 600 MG tablet, Take 1 tablet (600 mg total) by mouth every 6 (six) hours., Disp: 30 tablet, Rfl: 0;  oxyCODONE-acetaminophen (PERCOCET/ROXICET) 5-325 MG per tablet, Take 1 tablet by mouth every 4 (four) hours as needed for severe pain., Disp: 20 tablet, Rfl: 0;  Prenatal Vit-Fe Fumarate-FA (MULTIVITAMIN-PRENATAL) 27-0.8 MG TABS tablet, Take 1 tablet by mouth daily at 12 noon., Disp: , Rfl:   Review of Systems   Constitutional: Negative for fever and chills Eyes: Negative for visual disturbances Respiratory: Negative for shortness of breath, dyspnea Cardiovascular: Negative for chest pain or palpitations  Gastrointestinal: Negative for vomiting, diarrhea and constipation Genitourinary: Negative for dysuria and urgency Musculoskeletal: Negative for back pain, joint pain, myalgias  Neurological: Negative for dizziness and headaches   Objective:     Filed Vitals:   03/12/14 1108  BP: 120/78   General:  alert, cooperative and no distress   Breasts:  negative  Lungs: clear to auscultation bilaterally  Heart:  regular rate and rhythm  Abdomen: Soft, nontender   Vulva:  normal  Vagina: normal vagina  Cervix:  closed  Corpus: Well involuted     Rectal Exam: no hemorrhoids        Assessment:    normal postpartum exam.  Plan:    1. Contraception: vasectomy 2. Follow up in:   or as needed.

## 2014-04-30 ENCOUNTER — Encounter: Payer: Self-pay | Admitting: Advanced Practice Midwife

## 2014-04-30 ENCOUNTER — Ambulatory Visit (INDEPENDENT_AMBULATORY_CARE_PROVIDER_SITE_OTHER): Payer: Medicaid Other | Admitting: Advanced Practice Midwife

## 2014-04-30 VITALS — BP 120/60 | Ht 64.0 in | Wt 185.0 lb

## 2014-04-30 DIAGNOSIS — B372 Candidiasis of skin and nail: Secondary | ICD-10-CM | POA: Diagnosis not present

## 2014-04-30 DIAGNOSIS — Z3200 Encounter for pregnancy test, result unknown: Secondary | ICD-10-CM

## 2014-04-30 DIAGNOSIS — Z30011 Encounter for initial prescription of contraceptive pills: Secondary | ICD-10-CM

## 2014-04-30 MED ORDER — FLUCONAZOLE 150 MG PO TABS: ORAL_TABLET | ORAL | Status: DC

## 2014-04-30 MED ORDER — NORGESTIMATE-ETH ESTRADIOL 0.25-35 MG-MCG PO TABS: 1.0000 | ORAL_TABLET | Freq: Every day | ORAL | Status: DC

## 2014-04-30 NOTE — Progress Notes (Addendum)
[facility]  Patient name: [name] MRN [id number]  Date of birth: [birth year]  CC & HPI:  Sonia BrinkShavonne J Schorr is a 29 y.o. African American female presenting today for: 1. Contraception.  She is undecided between COC's, Nuvaring, or paragard IUD.  She likes to have a regular cycle.  Started her period yesterday 2.  C/O itching in her groin for about a week. She has noticed a rash.  Denies increased vaginal discharge 3.  C/O some urinary frequency for a day or two,  Also c/o lower abdominal cramps and some back pain since she started her period  Pertinent History Reviewed:  Medical & Surgical Hx:   Past Medical History  Diagnosis Date  . Irregular bleeding 01/17/2013  . Vaginal discharge 04/19/2013  . Yeast infection 04/19/2013  . Depression   . HSV infection    Past Surgical History  Procedure Laterality Date  . Ercp N/A 09/04/2012    Procedure: ENDOSCOPIC RETROGRADE CHOLANGIOPANCREATOGRAPHY (ERCP);  Surgeon: Rachael Feeaniel P Jacobs, MD;  [facility];  Service: Endoscopy;  Laterality: N/A;  . Cholecystectomy N/A 09/05/2012    Procedure: LAPAROSCOPIC CHOLECYSTECTOMY;  Surgeon: Emelia LoronMatthew Wakefield, MD;  [facility];  Service: General;  Laterality: N/A;   Family History  Problem Relation Age of Onset  . CAD Maternal Grandmother   . Hypertension Mother   . Diabetes Mother   . Breast cancer Maternal Aunt     Current outpatient prescriptions:  .  ibuprofen (ADVIL,MOTRIN) 600 MG tablet, , Disp: , Rfl: 0 .  fluconazole (DIFLUCAN) 150 MG tablet, 1 po stat; repeat in 3 days, Disp: 2 tablet, Rfl: 2 .  norgestimate-ethinyl estradiol (ORTHO-CYCLEN,SPRINTEC,PREVIFEM) 0.25-35 MG-MCG tablet, Take 1 tablet by mouth daily., Disp: 1 Package, Rfl: 11 Social History: Reviewed -  reports that she quit smoking about 20 months ago. Her smoking use included Cigarettes. She has a 2.5 pack-year smoking history. She has never used smokeless tobacco.  Review of Systems:    Constitutional: Negative for fever and chills Gastrointestinal: Negative for abdominal pain other than lower abdominal cramping. Genitourinary: Negative for dysuria and urgency, vaginal irritation or itching Musculoskeletal: Negative for joint pain, myalgias. POSITIVE for lower back pain/cramping for 2 days   Objective Findings:  Vitals: BP 120/60 mmHg  Ht 5\' 4"  (1.626 m)  Wt 185 lb (83.915 kg)  BMI 31.74 kg/m2  LMP 04/30/2014  Breastfeeding? No  Urine:  Blood/protein only  Physical Examination:  General appearance - alert, well appearing, and in no distress Mental status - alert, oriented to person, place, and time Abdomen - soft, nontender, nondistended, no masses or organomegaly Pelvic - VULVA: vulvar erythema and groin rash VAGINA: WET MOUNT done - results: small amount of yeast.  Moderate amount of menstrual blood  CERVIX: nonfriable,  Back exam - full range of motion, no tenderness, palpable spasm or pain on motion Musculoskeletal - no joint tenderness, deformity or swelling Extremities - no pedal edema noted  No results found for this or any previous visit (from the past 24 hour(s)).    Discussed risks/benefits/side effects of COC's, Nuva Ring, and Paragard.  Pt chooses COC's   25 minutes total encounter time     Assessment & Plan:  A:   Contraception management  Skin yeast  Menstrual cramps P:  Rx Sprintec #1 with11 RF  Diflucan 150mg  X2   F/U 1 year for pap (last pap 2/14:  Normal)   CRESENZO-DISHMAN,Latoiya Maradiaga CNM 04/30/2014 3:45 PM

## 2014-05-02 NOTE — Addendum Note (Signed)
Addended by: Richardson ChiquitoRAVIS, Vicenta Olds M on: 05/02/2014 03:37 PM   Modules accepted: Orders

## 2014-07-05 DIAGNOSIS — Z8659 Personal history of other mental and behavioral disorders: Secondary | ICD-10-CM | POA: Diagnosis not present

## 2014-07-05 DIAGNOSIS — Z793 Long term (current) use of hormonal contraceptives: Secondary | ICD-10-CM | POA: Insufficient documentation

## 2014-07-05 DIAGNOSIS — Z3202 Encounter for pregnancy test, result negative: Secondary | ICD-10-CM | POA: Insufficient documentation

## 2014-07-05 DIAGNOSIS — N390 Urinary tract infection, site not specified: Secondary | ICD-10-CM | POA: Insufficient documentation

## 2014-07-05 DIAGNOSIS — Z87891 Personal history of nicotine dependence: Secondary | ICD-10-CM | POA: Diagnosis not present

## 2014-07-05 DIAGNOSIS — R102 Pelvic and perineal pain: Secondary | ICD-10-CM | POA: Diagnosis present

## 2014-07-05 DIAGNOSIS — Z8619 Personal history of other infectious and parasitic diseases: Secondary | ICD-10-CM | POA: Diagnosis not present

## 2014-07-06 ENCOUNTER — Encounter (HOSPITAL_COMMUNITY): Payer: Self-pay | Admitting: Emergency Medicine

## 2014-07-06 ENCOUNTER — Emergency Department (HOSPITAL_COMMUNITY)
Admission: EM | Admit: 2014-07-06 | Discharge: 2014-07-06 | Disposition: A | Discharge status: Home / Self Care | Payer: Medicaid Other | Attending: Emergency Medicine | Admitting: Emergency Medicine

## 2014-07-06 DIAGNOSIS — R1030 Lower abdominal pain, unspecified: Secondary | ICD-10-CM

## 2014-07-06 DIAGNOSIS — N390 Urinary tract infection, site not specified: Secondary | ICD-10-CM

## 2014-07-06 LAB — WET PREP, GENITAL
TRICH WET PREP: NONE SEEN
Yeast Wet Prep HPF POC: NONE SEEN

## 2014-07-06 LAB — CBC WITH DIFFERENTIAL/PLATELET
Basophils Absolute: 0 10*3/uL (ref 0.0–0.1)
Basophils Relative: 0 % (ref 0–1)
EOS ABS: 0.4 10*3/uL (ref 0.0–0.7)
EOS PCT: 5 % (ref 0–5)
HCT: 37.8 % (ref 36.0–46.0)
Hemoglobin: 12.6 g/dL (ref 12.0–15.0)
LYMPHS ABS: 3.7 10*3/uL (ref 0.7–4.0)
Lymphocytes Relative: 41 % (ref 12–46)
MCH: 31 pg (ref 26.0–34.0)
MCHC: 33.3 g/dL (ref 30.0–36.0)
MCV: 92.9 fL (ref 78.0–100.0)
Monocytes Absolute: 0.7 10*3/uL (ref 0.1–1.0)
Monocytes Relative: 8 % (ref 3–12)
Neutro Abs: 4.2 10*3/uL (ref 1.7–7.7)
Neutrophils Relative %: 46 % (ref 43–77)
PLATELETS: 281 10*3/uL (ref 150–400)
RBC: 4.07 MIL/uL (ref 3.87–5.11)
RDW: 13.2 % (ref 11.5–15.5)
WBC: 9.1 10*3/uL (ref 4.0–10.5)

## 2014-07-06 LAB — URINALYSIS, ROUTINE W REFLEX MICROSCOPIC
Bilirubin Urine: NEGATIVE
Glucose, UA: NEGATIVE mg/dL
Ketones, ur: NEGATIVE mg/dL
Nitrite: NEGATIVE
Protein, ur: 100 mg/dL — AB
SPECIFIC GRAVITY, URINE: 1.021 (ref 1.005–1.030)
Urobilinogen, UA: 1 mg/dL (ref 0.0–1.0)
pH: 6.5 (ref 5.0–8.0)

## 2014-07-06 LAB — COMPREHENSIVE METABOLIC PANEL
ALT: 16 U/L (ref 14–54)
AST: 17 U/L (ref 15–41)
Albumin: 3.6 g/dL (ref 3.5–5.0)
Alkaline Phosphatase: 71 U/L (ref 38–126)
Anion gap: 9 (ref 5–15)
BUN: 9 mg/dL (ref 6–20)
CHLORIDE: 107 mmol/L (ref 101–111)
CO2: 23 mmol/L (ref 22–32)
Calcium: 9 mg/dL (ref 8.9–10.3)
Creatinine, Ser: 0.83 mg/dL (ref 0.44–1.00)
GFR calc Af Amer: >60 mL/min (ref >60)
GFR calc non Af Amer: >60 mL/min (ref >60)
Glucose, Bld: 117 mg/dL — ABNORMAL HIGH (ref 70–99)
Potassium: 3.6 mmol/L (ref 3.5–5.1)
Sodium: 139 mmol/L (ref 135–145)
TOTAL PROTEIN: 6.7 g/dL (ref 6.5–8.1)
Total Bilirubin: 0.7 mg/dL (ref 0.3–1.2)

## 2014-07-06 LAB — URINE MICROSCOPIC-ADD ON

## 2014-07-06 LAB — LIPASE, BLOOD: Lipase: 35 U/L (ref 22–51)

## 2014-07-06 LAB — POC URINE PREG, ED: Preg Test, Ur: NEGATIVE

## 2014-07-06 MED ORDER — HYDROCODONE-ACETAMINOPHEN 5-325 MG PO TABS
1.0000 | ORAL_TABLET | ORAL | Status: AC
  Administered 2014-07-06: 1 via ORAL
  Filled 2014-07-06: qty 1

## 2014-07-06 MED ORDER — CEPHALEXIN 500 MG PO CAPS: 500.0000 mg | ORAL_CAPSULE | Freq: Four times a day (QID) | ORAL | Status: DC

## 2014-07-06 MED ORDER — PHENAZOPYRIDINE HCL 200 MG PO TABS: 200.0000 mg | ORAL_TABLET | Freq: Three times a day (TID) | ORAL | Status: DC

## 2014-07-06 NOTE — ED Notes (Signed)
Discharge instructions reviewed, voiced understanding.  

## 2014-07-06 NOTE — Discharge Instructions (Signed)
Please follow the directions provided. Be sure to follow-up with your primary care doctor to ensure you're getting better. Please drink plenty of fluids by mouth to stay well hydrated. Please take the antibiotic, Keflex, until it is all gone. You may use the Pyridium as directed to help with pain. Don't hesitate to return for any new, worsening, or concerning symptoms.    SEEK IMMEDIATE MEDICAL CARE IF:  You have severe back pain or lower abdominal pain.  You develop chills.  You have nausea or vomiting.  You have continued burning or discomfort with urination.

## 2014-07-06 NOTE — ED Notes (Signed)
Pt states she might have forgotten an old tampon from a couple weeks ago.  Has a fullness feeling, but was unable to locate.

## 2014-07-06 NOTE — ED Notes (Signed)
C/o sharp pelvic pain and nausea x 2 weeks.  Last BM today- normal.  States it still feels like she has to urinate after voiding.

## 2014-07-06 NOTE — ED Provider Notes (Signed)
CSN: [id number]     Arrival date & time 07/05/14  2356 History   First MD Initiated Contact with Patient 07/06/14 0011     Chief Complaint  Patient presents with  . Pelvic Pain   (Consider location/radiation/quality/duration/timing/severity/associated sxs/prior Treatment) HPI  [name] is a 29 yo female presenting with report of pelvic pain.  She states she first noticed this pain 4 weeks ago.  She reports urinary urgency and frequency and lower abd pain. She rates her pain as 7/10. The pain seemed to resolve but then returned two weeks ago. Her lmp was 2 weeks ago and her last bm was today and was soft. She denies fevers, chills, back pain, vaginal odor or vaginal discharge.   Past Medical History  Diagnosis Date  . Irregular bleeding 01/17/2013  . Vaginal discharge 04/19/2013  . Yeast infection 04/19/2013  . Depression   . HSV infection    Past Surgical History  Procedure Laterality Date  . Ercp N/A 09/04/2012    Procedure: ENDOSCOPIC RETROGRADE CHOLANGIOPANCREATOGRAPHY (ERCP);  Surgeon: Rachael Feeaniel P Jacobs, MD;  [facility];  Service: Endoscopy;  Laterality: N/A;  . Cholecystectomy N/A 09/05/2012    Procedure: LAPAROSCOPIC CHOLECYSTECTOMY;  Surgeon: Emelia LoronMatthew Wakefield, MD;  [facility];  Service: General;  Laterality: N/A;   Family History  Problem Relation Age of Onset  . CAD Maternal Grandmother   . Hypertension Mother   . Diabetes Mother   . Breast cancer Maternal Aunt    History  Substance Use Topics  . Smoking status: Former Smoker -- 0.25 packs/day for 10 years    Types: Cigarettes    Quit date: 08/29/2012  . Smokeless tobacco: Never Used  . Alcohol Use: No   OB History    Gravida Para Term Preterm AB TAB SAB Ectopic Multiple Living   4 4 4       0 4     Review of Systems  Constitutional: Negative for fever and chills.  HENT: Negative for sore throat.   Eyes: Negative for visual disturbance.  Respiratory: Negative for cough and shortness of breath.     Cardiovascular: Negative for chest pain and leg swelling.  Gastrointestinal: Positive for abdominal pain. Negative for nausea, vomiting and diarrhea.  Genitourinary: Positive for urgency and frequency. Negative for dysuria, vaginal discharge and vaginal pain.  Musculoskeletal: Negative for myalgias.  Skin: Negative for rash.  Neurological: Negative for weakness, numbness and headaches.      Allergies  Review of patient's allergies indicates no known allergies.  Home Medications   Prior to Admission medications   Medication Sig Start Date End Date Taking? Authorizing Provider  fluconazole (DIFLUCAN) 150 MG tablet 1 po stat; repeat in 3 days 04/30/14   Jacklyn ShellFrances Cresenzo-Dishmon, CNM  ibuprofen (ADVIL,MOTRIN) 600 MG tablet  01/18/14   Historical Provider, MD  norgestimate-ethinyl estradiol (ORTHO-CYCLEN,SPRINTEC,PREVIFEM) 0.25-35 MG-MCG tablet Take 1 tablet by mouth daily. 04/30/14   Jacklyn ShellFrances Cresenzo-Dishmon, CNM   BP 130/75 mmHg  Pulse 79  Temp(Src) 98.2 F (36.8 C) (Oral)  Resp 16  Ht 5\' 3"  (1.6 m)  Wt 177 lb (80.287 kg)  BMI 31.36 kg/m2  SpO2 97%  LMP 06/22/2014 (Exact Date) Physical Exam  Constitutional: She appears well-developed and well-nourished. No distress.  HENT:  Head: Normocephalic and atraumatic.  Mouth/Throat: Oropharynx is clear and moist. No oropharyngeal exudate.  Eyes: Conjunctivae are normal.  Neck: Neck supple. No thyromegaly present.  Cardiovascular: Normal rate, regular rhythm and intact distal pulses.   Pulmonary/Chest: Effort normal  and breath sounds normal. No respiratory distress. She has no wheezes. She has no rales. She exhibits no tenderness.  Abdominal: Soft. She exhibits no distension and no mass. There is no hepatosplenomegaly. There is tenderness in the suprapubic area. There is no rigidity, no rebound, no guarding, no CVA tenderness, no tenderness at McBurney's point and negative Murphy's sign.    Genitourinary: There is no tenderness on the  right labia. There is no tenderness on the left labia. Cervix exhibits no motion tenderness, no discharge and no friability. Right adnexum displays no tenderness. Left adnexum displays no tenderness. No vaginal discharge found.  Musculoskeletal: She exhibits no tenderness.  Lymphadenopathy:    She has no cervical adenopathy.  Neurological: She is alert.  Skin: Skin is warm and dry. No rash noted. She is not diaphoretic.  Psychiatric: She has a normal mood and affect.  Nursing note and vitals reviewed.   ED Course  Procedures (including critical care time) Labs Review Labs Reviewed  WET PREP, GENITAL - Abnormal; Notable for the following:    Clue Cells Wet Prep HPF POC FEW (*)    WBC, Wet Prep HPF POC FEW (*)    All other components within normal limits  COMPREHENSIVE METABOLIC PANEL - Abnormal; Notable for the following:    Glucose, Bld 117 (*)    All other components within normal limits  URINALYSIS, ROUTINE W REFLEX MICROSCOPIC - Abnormal; Notable for the following:    APPearance CLOUDY (*)    Hgb urine dipstick TRACE (*)    Protein, ur 100 (*)    Leukocytes, UA MODERATE (*)    All other components within normal limits  URINE MICROSCOPIC-ADD ON - Abnormal; Notable for the following:    Squamous Epithelial / LPF FEW (*)    All other components within normal limits  URINE CULTURE  CBC WITH DIFFERENTIAL/PLATELET  LIPASE, BLOOD  POC URINE PREG, ED  GC/CHLAMYDIA PROBE AMP ([place])    Imaging Review No results found.   EKG Interpretation None      MDM   Final diagnoses:  UTI (lower urinary tract infection)  Lower abdominal pain   29 yo with suprapubic pain, UA shows leukocytes and nitrites. Wet prep without significant abnormalities. Pt has been diagnosed with a UTI. Pt is afebrile, no CVA tenderness, normotensive, and denies N/V. Pt to be dc home with antibiotics. Pt is well-appearing, in no acute distress and vital signs reviewed and not concerning. She appears  safe to be discharged.  Discharge include follow-up with their PCP.  Return precautions provided. Pt aware of plan and in agreement.   Filed Vitals:   07/06/14 0130 07/06/14 0135 07/06/14 0145 07/06/14 0215  BP: 138/103 138/103 122/65 127/80  Pulse: 60 60 56 47  Temp:      TempSrc:      Resp:  16    Height:      Weight:      SpO2: 100% 100% 99% 98%   Meds given in ED:  Medications  HYDROcodone-acetaminophen (NORCO/VICODIN) 5-325 MG per tablet 1 tablet (1 tablet Oral Given 07/06/14 0117)    Discharge Medication List as of 07/06/2014  2:26 AM    START taking these medications   Details  cephALEXin (KEFLEX) 500 MG capsule Take 1 capsule (500 mg total) by mouth 4 (four) times daily., Starting 07/06/2014, Until Discontinued, Print    phenazopyridine (PYRIDIUM) 200 MG tablet Take 1 tablet (200 mg total) by mouth 3 (three) times daily., Starting 07/06/2014, Until Discontinued,  Print           Harle BattiestElizabeth Donelle Hise, NP 07/06/14 1753  Marisa Severinlga Otter, MD 07/08/14 [phone]

## 2014-07-08 LAB — URINE CULTURE: Colony Count: >=100000

## 2014-07-08 LAB — GC/CHLAMYDIA PROBE AMP (~~LOC~~) NOT AT ARMC
Chlamydia: NEGATIVE
Neisseria Gonorrhea: NEGATIVE

## 2014-07-09 ENCOUNTER — Telehealth (HOSPITAL_BASED_OUTPATIENT_CLINIC_OR_DEPARTMENT_OTHER): Payer: Self-pay | Admitting: Emergency Medicine

## 2014-07-09 NOTE — Telephone Encounter (Signed)
Post ED Visit - Positive Culture Follow-up  Culture report reviewed by antimicrobial stewardship pharmacist: []  Wes Dulaney, Pharm.D., BCPS [x]  Celedonio MiyamotoJeremy Frens, [address] BCPS []  Georgina PillionElizabeth Martin, Pharm.D., BCPS []  TrailMinh Pham, [address]Ph[address] BCPS, AAHIVP []  Estella HuskMichelle Turner, Pharm.D., BCPS, AAHIVP []  Elder CyphersLorie Poole, [address] BCPS  Positive urine culture Staphylococcus Treated with cephalexin, organism sensitive to the same and no further patient follow-up is required at this time.  Berle MullMiller, Lacara Dunsworth 07/09/2014, 9:57 AM

## 2014-08-09 ENCOUNTER — Encounter (HOSPITAL_COMMUNITY): Payer: Self-pay | Admitting: *Deleted

## 2014-08-09 ENCOUNTER — Emergency Department (HOSPITAL_COMMUNITY): Payer: Medicaid Other

## 2014-08-09 ENCOUNTER — Emergency Department (HOSPITAL_COMMUNITY)
Admission: EM | Admit: 2014-08-09 | Discharge: 2014-08-09 | Disposition: A | Discharge status: Home / Self Care | Payer: Medicaid Other | Attending: Emergency Medicine | Admitting: Emergency Medicine

## 2014-08-09 DIAGNOSIS — W19XXXA Unspecified fall, initial encounter: Secondary | ICD-10-CM

## 2014-08-09 DIAGNOSIS — Z87891 Personal history of nicotine dependence: Secondary | ICD-10-CM | POA: Diagnosis not present

## 2014-08-09 DIAGNOSIS — S0990XA Unspecified injury of head, initial encounter: Secondary | ICD-10-CM | POA: Diagnosis not present

## 2014-08-09 DIAGNOSIS — Y998 Other external cause status: Secondary | ICD-10-CM | POA: Diagnosis not present

## 2014-08-09 DIAGNOSIS — Z8742 Personal history of other diseases of the female genital tract: Secondary | ICD-10-CM | POA: Diagnosis not present

## 2014-08-09 DIAGNOSIS — Z8619 Personal history of other infectious and parasitic diseases: Secondary | ICD-10-CM | POA: Diagnosis not present

## 2014-08-09 DIAGNOSIS — S3992XA Unspecified injury of lower back, initial encounter: Secondary | ICD-10-CM | POA: Insufficient documentation

## 2014-08-09 DIAGNOSIS — W01198A Fall on same level from slipping, tripping and stumbling with subsequent striking against other object, initial encounter: Secondary | ICD-10-CM | POA: Insufficient documentation

## 2014-08-09 DIAGNOSIS — Z8659 Personal history of other mental and behavioral disorders: Secondary | ICD-10-CM | POA: Diagnosis not present

## 2014-08-09 DIAGNOSIS — Y92009 Unspecified place in unspecified non-institutional (private) residence as the place of occurrence of the external cause: Secondary | ICD-10-CM | POA: Diagnosis not present

## 2014-08-09 DIAGNOSIS — Z792 Long term (current) use of antibiotics: Secondary | ICD-10-CM | POA: Diagnosis not present

## 2014-08-09 DIAGNOSIS — Z3202 Encounter for pregnancy test, result negative: Secondary | ICD-10-CM | POA: Diagnosis not present

## 2014-08-09 DIAGNOSIS — Y9389 Activity, other specified: Secondary | ICD-10-CM | POA: Diagnosis not present

## 2014-08-09 DIAGNOSIS — T1490XA Injury, unspecified, initial encounter: Secondary | ICD-10-CM

## 2014-08-09 DIAGNOSIS — Z79899 Other long term (current) drug therapy: Secondary | ICD-10-CM | POA: Diagnosis not present

## 2014-08-09 LAB — I-STAT CHEM 8, ED
BUN: 7 mg/dL (ref 6–20)
CALCIUM ION: 1.23 mmol/L (ref 1.12–1.23)
CHLORIDE: 106 mmol/L (ref 101–111)
Creatinine, Ser: 0.7 mg/dL (ref 0.44–1.00)
Glucose, Bld: 99 mg/dL (ref 65–99)
HCT: 39 % (ref 36.0–46.0)
Hemoglobin: 13.3 g/dL (ref 12.0–15.0)
Potassium: 3.4 mmol/L — ABNORMAL LOW (ref 3.5–5.1)
Sodium: 143 mmol/L (ref 135–145)
TCO2: 20 mmol/L (ref 0–100)

## 2014-08-09 LAB — POC URINE PREG, ED: Preg Test, Ur: NEGATIVE

## 2014-08-09 MED ORDER — HYDROMORPHONE HCL 1 MG/ML IJ SOLN
1.0000 mg | Freq: Once | INTRAMUSCULAR | Status: AC
  Administered 2014-08-09: 1 mg via INTRAVENOUS
  Filled 2014-08-09: qty 1

## 2014-08-09 MED ORDER — HYDROCODONE-ACETAMINOPHEN 5-325 MG PO TABS: 1.0000 | ORAL_TABLET | Freq: Four times a day (QID) | ORAL | Status: DC | PRN

## 2014-08-09 MED ORDER — IOHEXOL 300 MG/ML  SOLN
100.0000 mL | Freq: Once | INTRAMUSCULAR | Status: AC | PRN
  Administered 2014-08-09: 100 mL via INTRAVENOUS

## 2014-08-09 MED ORDER — ONDANSETRON HCL 4 MG/2ML IJ SOLN
4.0000 mg | Freq: Once | INTRAMUSCULAR | Status: AC
  Administered 2014-08-09: 4 mg via INTRAVENOUS
  Filled 2014-08-09: qty 2

## 2014-08-09 NOTE — ED Notes (Signed)
Back pain, neck pain, headache. Patient is very uncooperative, refuses to talk without significant other in room.

## 2014-08-09 NOTE — Discharge Instructions (Signed)
Follow up with a family md or dr. Romeo Apple in not improving

## 2014-08-09 NOTE — ED Notes (Signed)
Pt escorted to bathroom, nurse remained with pt. Pt ambulated without difficulty in restroom.

## 2014-08-09 NOTE — ED Provider Notes (Signed)
CSN: [id number]     Arrival date & time 08/09/14  1355 History   First MD Initiated Contact with Patient 08/09/14 1509     Chief Complaint  Patient presents with  . Pain     (Consider location/radiation/quality/duration/timing/severity/associated sxs/prior Treatment) Patient is a 29 y.o. female presenting with fall. The history is provided by the patient (the pt was in a house when a tree fell thought the house and hit her back.  pt has upper and lower back pain).  Fall This is a new problem. The current episode started 3 to 5 hours ago. The problem occurs constantly. The problem has not changed since onset.Pertinent negatives include no chest pain, no abdominal pain and no headaches. Nothing aggravates the symptoms. Nothing relieves the symptoms.    Past Medical History  Diagnosis Date  . Irregular bleeding 01/17/2013  . Vaginal discharge 04/19/2013  . Yeast infection 04/19/2013  . Depression   . HSV infection    Past Surgical History  Procedure Laterality Date  . Ercp N/A 09/04/2012    Procedure: ENDOSCOPIC RETROGRADE CHOLANGIOPANCREATOGRAPHY (ERCP);  Surgeon: Rachael Fee, MD;  Location[facility] OR;  Service: Endoscopy;  Laterality: N/A;  . Cholecystectomy N/A 09/05/2012    Procedure: LAPAROSCOPIC CHOLECYSTECTOMY;  Surgeon: Emelia Loron, MD;  Location: [facility] OR;  Service: General;  Laterality: N/A;   Family History  Problem Relation Age of Onset  . CAD Maternal Grandmother   . Hypertension Mother   . Diabetes Mother   . Breast cancer Maternal Aunt    History  Substance Use Topics  . Smoking status: Former Smoker -- 0.25 packs/day for 10 years    Types: Cigarettes    Quit date: 08/29/2012  . Smokeless tobacco: Never Used  . Alcohol Use: No   OB History    Gravida Para Term Preterm AB TAB SAB Ectopic Multiple Living   0 4     Review of Systems  Constitutional: Negative for appetite change and fatigue.  HENT: Negative for congestion, ear discharge and sinus  pressure.   Eyes: Negative for discharge.  Respiratory: Negative for cough.   Cardiovascular: Negative for chest pain.  Gastrointestinal: Negative for abdominal pain and diarrhea.  Genitourinary: Negative for frequency and hematuria.  Musculoskeletal: Positive for back pain.  Skin: Negative for rash.  Neurological: Negative for seizures and headaches.  Psychiatric/Behavioral: Negative for hallucinations.      Allergies  Review of patient's allergies indicates no known allergies.  Home Medications   Prior to Admission medications   Medication Sig Start Date End Date Taking? Authorizing Provider  cephALEXin (KEFLEX) 500 MG capsule Take 1 capsule (500 mg total) by mouth 4 (four) times daily. Patient not taking: Reported on 08/09/2014 07/06/14   Harle Battiest, NP  fluconazole (DIFLUCAN) 150 MG tablet 1 po stat; repeat in 3 days Patient not taking: Reported on 07/06/2014 04/30/14   Jacklyn Shell, CNM  HYDROcodone-acetaminophen (NORCO/VICODIN) 5-325 MG per tablet Take 1 tablet by mouth every 6 (six) hours as needed. 08/09/14   Bethann Berkshire, MD  norgestimate-ethinyl estradiol (ORTHO-CYCLEN,SPRINTEC,PREVIFEM) 0.25-35 MG-MCG tablet Take 1 tablet by mouth daily. Patient not taking: Reported on 07/06/2014 04/30/14   Jacklyn Shell, CNM  phenazopyridine (PYRIDIUM) 200 MG tablet Take 1 tablet (200 mg total) by mouth 3 (three) times daily. Patient not taking: Reported on 08/09/2014 07/06/14   Harle Battiest, NP   BP 127/78 mmHg  Pulse 80  Temp(Src) 97.5 F (36.4 C) (Oral)  Resp  18  Ht   Wt   SpO2 100%  LMP 07/14/2014 (Approximate) Physical Exam  Constitutional: She is oriented to person, place, and time. She appears well-developed.  HENT:  Head: Normocephalic.  Eyes: Conjunctivae and EOM are normal. No scleral icterus.  Neck: Neck supple. No thyromegaly present.  Cardiovascular: Normal rate and regular rhythm.  Exam reveals no gallop and no friction rub.   No murmur  heard. Pulmonary/Chest: No stridor. She has no wheezes. She has no rales. She exhibits no tenderness.  Abdominal: She exhibits no distension. There is no tenderness. There is no rebound.  Musculoskeletal: Normal range of motion. She exhibits no edema.  Tender thoracic  And lumbar spine  Lymphadenopathy:    She has no cervical adenopathy.  Neurological: She is oriented to person, place, and time. She exhibits normal muscle tone. Coordination normal.  Skin: No rash noted. No erythema.  Psychiatric: She has a normal mood and affect. Her behavior is normal.    ED Course  Procedures (including critical care time) Labs Review Labs Reviewed  I-STAT CHEM 8, ED - Abnormal; Notable for the following:    Potassium 3.4 (*)    All other components within normal limits  POC URINE PREG, ED    Imaging Review Ct Head Wo Contrast  08/09/2014   CLINICAL DATA:  Fall through tree house. Head and neck pain. Initial encounter.  EXAM: CT HEAD WITHOUT CONTRAST  CT CERVICAL SPINE WITHOUT CONTRAST  TECHNIQUE: Multidetector CT imaging of the head and cervical spine was performed following the standard protocol without intravenous contrast. Multiplanar CT image reconstructions of the cervical spine were also generated.  COMPARISON:  None.  FINDINGS: CT HEAD FINDINGS  Skull and Sinuses:Negative for fracture or destructive process. The mastoids, middle ears, and imaged paranasal sinuses are clear.  Orbits: No acute abnormality.  Brain: No evidence of acute infarction, hemorrhage, hydrocephalus, or mass lesion/mass effect.  CT CERVICAL SPINE FINDINGS  Negative for acute fracture or subluxation. No prevertebral edema. No gross cervical canal hematoma. No significant osseous canal or foraminal stenosis.  IMPRESSION: No evidence of intracranial or cervical spine injury.   Electronically Signed   By: Marnee Spring M.D.   On: 08/09/2014 17:17   Ct Chest W Contrast  08/09/2014   CLINICAL DATA:  Status post trauma. Pain at  the head, neck and left arm. Concern for chest or abdominal injury. Tree fell into house. Initial encounter.  EXAM: CT CHEST, ABDOMEN, AND PELVIS WITH CONTRAST  TECHNIQUE: Multidetector CT imaging of the chest, abdomen and pelvis was performed following the standard protocol during bolus administration of intravenous contrast.  CONTRAST:  OMNIPAQUE IOHEXOL 300 MG/ML  SOLN  COMPARISON:  Pelvic ultrasound performed 09/12/2013, and CT of the abdomen and pelvis from 12/28/2008  FINDINGS: CT CHEST FINDINGS  Minimal bibasilar atelectasis is noted. The lungs are otherwise clear. There is no evidence of pneumothorax. No pulmonary parenchymal contusion is seen. No pleural effusion or focal consolidation is appreciated.  The mediastinum is unremarkable in appearance. No mediastinal lymphadenopathy is seen. No pericardial effusion is identified. The great vessels are grossly unremarkable. There is no evidence of venous hemorrhage. Residual thymic tissue is within normal limits. The thyroid gland is unremarkable. No axillary lymphadenopathy is seen.  There is no evidence of significant soft tissue injury along the chest wall.  No acute osseous abnormalities are identified.  CT ABDOMEN AND PELVIS FINDINGS  No free air or free fluid is seen within the abdomen or pelvis. There  is no evidence of solid or hollow organ injury.  The liver and spleen are unremarkable in appearance. The patient is status post cholecystectomy, with clips noted at the gallbladder fossa. The pancreas and adrenal glands are unremarkable.  The kidneys are unremarkable in appearance. There is no evidence of hydronephrosis. No renal or ureteral stones are seen. No perinephric stranding is appreciated.  No free fluid is identified. The small bowel is unremarkable in appearance. The stomach is within normal limits. No acute vascular abnormalities are seen.  The appendix is normal in caliber, without evidence of appendicitis. The colon is unremarkable in  appearance.  The bladder is decompressed and not well assessed. The uterus is unremarkable in appearance. The ovaries are relatively symmetric. No suspicious adnexal masses are seen. No inguinal lymphadenopathy is seen.  No acute osseous abnormalities are identified.  IMPRESSION: No evidence of traumatic injury to the chest, abdomen or pelvis.   Electronically Signed   By: Roanna Raider M.D.   On: 08/09/2014 17:28   Ct Cervical Spine Wo Contrast  08/09/2014   CLINICAL DATA:  Fall through tree house. Head and neck pain. Initial encounter.  EXAM: CT HEAD WITHOUT CONTRAST  CT CERVICAL SPINE WITHOUT CONTRAST  TECHNIQUE: Multidetector CT imaging of the head and cervical spine was performed following the standard protocol without intravenous contrast. Multiplanar CT image reconstructions of the cervical spine were also generated.  COMPARISON:  None.  FINDINGS: CT HEAD FINDINGS  Skull and Sinuses:Negative for fracture or destructive process. The mastoids, middle ears, and imaged paranasal sinuses are clear.  Orbits: No acute abnormality.  Brain: No evidence of acute infarction, hemorrhage, hydrocephalus, or mass lesion/mass effect.  CT CERVICAL SPINE FINDINGS  Negative for acute fracture or subluxation. No prevertebral edema. No gross cervical canal hematoma. No significant osseous canal or foraminal stenosis.  IMPRESSION: No evidence of intracranial or cervical spine injury.   Electronically Signed   By: Marnee Spring M.D.   On: 08/09/2014 17:17   Ct Abdomen Pelvis W Contrast  08/09/2014   CLINICAL DATA:  Status post trauma. Pain at the head, neck and left arm. Concern for chest or abdominal injury. Tree fell into house. Initial encounter.  EXAM: CT CHEST, ABDOMEN, AND PELVIS WITH CONTRAST  TECHNIQUE: Multidetector CT imaging of the chest, abdomen and pelvis was performed following the standard protocol during bolus administration of intravenous contrast.  CONTRAST:  OMNIPAQUE IOHEXOL 300 MG/ML  SOLN   COMPARISON:  Pelvic ultrasound performed 09/12/2013, and CT of the abdomen and pelvis from 12/28/2008  FINDINGS: CT CHEST FINDINGS  Minimal bibasilar atelectasis is noted. The lungs are otherwise clear. There is no evidence of pneumothorax. No pulmonary parenchymal contusion is seen. No pleural effusion or focal consolidation is appreciated.  The mediastinum is unremarkable in appearance. No mediastinal lymphadenopathy is seen. No pericardial effusion is identified. The great vessels are grossly unremarkable. There is no evidence of venous hemorrhage. Residual thymic tissue is within normal limits. The thyroid gland is unremarkable. No axillary lymphadenopathy is seen.  There is no evidence of significant soft tissue injury along the chest wall.  No acute osseous abnormalities are identified.  CT ABDOMEN AND PELVIS FINDINGS  No free air or free fluid is seen within the abdomen or pelvis. There is no evidence of solid or hollow organ injury.  The liver and spleen are unremarkable in appearance. The patient is status post cholecystectomy, with clips noted at the gallbladder fossa. The pancreas and adrenal glands are unremarkable.  The  kidneys are unremarkable in appearance. There is no evidence of hydronephrosis. No renal or ureteral stones are seen. No perinephric stranding is appreciated.  No free fluid is identified. The small bowel is unremarkable in appearance. The stomach is within normal limits. No acute vascular abnormalities are seen.  The appendix is normal in caliber, without evidence of appendicitis. The colon is unremarkable in appearance.  The bladder is decompressed and not well assessed. The uterus is unremarkable in appearance. The ovaries are relatively symmetric. No suspicious adnexal masses are seen. No inguinal lymphadenopathy is seen.  No acute osseous abnormalities are identified.  IMPRESSION: No evidence of traumatic injury to the chest, abdomen or pelvis.   Electronically Signed   By: Roanna Raider M.D.   On: 08/09/2014 17:28     EKG Interpretation None      MDM   Final diagnoses:  Trauma  Fall, initial encounter    Ct neg.  tx with vicodin and rest    Bethann Berkshire, MD 08/09/14 [phone]

## 2014-08-09 NOTE — ED Notes (Addendum)
Family has left. No one here to help manage baby. Staff holding baby for pt.  Sling applied to left arm of father so that he can assist with baby.   

## 2014-08-09 NOTE — ED Notes (Signed)
Very uncooperative,

## 2014-08-12 ENCOUNTER — Encounter (HOSPITAL_COMMUNITY): Payer: Self-pay | Admitting: Family Medicine

## 2014-08-12 ENCOUNTER — Emergency Department (HOSPITAL_COMMUNITY)
Admission: EM | Admit: 2014-08-12 | Discharge: 2014-08-12 | Disposition: A | Discharge status: Home / Self Care | Payer: Medicaid Other | Attending: Emergency Medicine | Admitting: Emergency Medicine

## 2014-08-12 ENCOUNTER — Emergency Department (HOSPITAL_COMMUNITY)
Admission: EM | Admit: 2014-08-12 | Discharge: 2014-08-12 | Discharge status: Absent without leave | Payer: Medicaid Other

## 2014-08-12 DIAGNOSIS — Z8619 Personal history of other infectious and parasitic diseases: Secondary | ICD-10-CM | POA: Diagnosis not present

## 2014-08-12 DIAGNOSIS — Z8659 Personal history of other mental and behavioral disorders: Secondary | ICD-10-CM | POA: Diagnosis not present

## 2014-08-12 DIAGNOSIS — Z8742 Personal history of other diseases of the female genital tract: Secondary | ICD-10-CM | POA: Insufficient documentation

## 2014-08-12 DIAGNOSIS — M545 Low back pain: Secondary | ICD-10-CM | POA: Insufficient documentation

## 2014-08-12 DIAGNOSIS — M542 Cervicalgia: Secondary | ICD-10-CM | POA: Diagnosis not present

## 2014-08-12 DIAGNOSIS — Z87891 Personal history of nicotine dependence: Secondary | ICD-10-CM | POA: Insufficient documentation

## 2014-08-12 DIAGNOSIS — M546 Pain in thoracic spine: Secondary | ICD-10-CM | POA: Insufficient documentation

## 2014-08-12 DIAGNOSIS — M549 Dorsalgia, unspecified: Secondary | ICD-10-CM

## 2014-08-12 MED ORDER — CYCLOBENZAPRINE HCL 10 MG PO TABS: 10.0000 mg | ORAL_TABLET | Freq: Two times a day (BID) | ORAL | Status: DC | PRN

## 2014-08-12 NOTE — Discharge Instructions (Signed)

## 2014-08-12 NOTE — ED Provider Notes (Signed)
CSN: [id number]     Arrival date & time 08/12/14  1016 History  This chart was scribed for non-physician practitioner, Teressa Lower, NP, working with Jerelyn Scott, MD, by Ronney Lion, ED Scribe. This patient was seen in room TR09C/TR09C and the patient's care was started at 11:02 AM.    Chief Complaint  Patient presents with  . Neck Pain  . Back Pain   The history is provided by the patient. No language interpreter was used.     HPI Comments: [name] is a 29 y.o. female who presents to the Emergency Department complaining of constant, severe pain in her upper and lower back that began 3 days ago after a tree fell through patient's house and struck her in the back. She reports she also lost consciousness when this happened. Patient was seen at [facility] ED for this 3 days ago and had CT scans of her head, neck, chest, and abdomen that were normal. However, she states she has been having continuing pain since.  Past Medical History  Diagnosis Date  . Irregular bleeding 01/17/2013  . Vaginal discharge 04/19/2013  . Yeast infection 04/19/2013  . Depression   . HSV infection    Past Surgical History  Procedure Laterality Date  . Ercp N/A 09/04/2012    Procedure: ENDOSCOPIC RETROGRADE CHOLANGIOPANCREATOGRAPHY (ERCP);  Surgeon: Rachael Fee, MD;  Location: [facility] OR;  Service: Endoscopy;  Laterality: N/A;  . Cholecystectomy N/A 09/05/2012    Procedure: LAPAROSCOPIC CHOLECYSTECTOMY;  Surgeon: Emelia Loron, MD;  Location: [facility] OR;  Service: General;  Laterality: N/A;   Family History  Problem Relation Age of Onset  . CAD Maternal Grandmother   . Hypertension Mother   . Diabetes Mother   . Breast cancer Maternal Aunt    History  Substance Use Topics  . Smoking status: Former Smoker -- 0.25 packs/day for 10 years    Types: Cigarettes    Quit date: 08/29/2012  . Smokeless tobacco: Never Used  . Alcohol Use: No   OB History    Gravida Para Term Preterm AB TAB SAB Ectopic Multiple  Living   4 4 4       0 4     Review of Systems  Musculoskeletal: Positive for back pain.  All other systems reviewed and are negative.  Allergies  Review of patient's allergies indicates no known allergies.  Home Medications   Prior to Admission medications   Medication Sig Start Date End Date Taking? Authorizing Provider  cephALEXin (KEFLEX) 500 MG capsule Take 1 capsule (500 mg total) by mouth 4 (four) times daily. Patient not taking: Reported on 08/09/2014 07/06/14   Harle Battiest, NP  fluconazole (DIFLUCAN) 150 MG tablet 1 po stat; repeat in 3 days Patient not taking: Reported on 07/06/2014 04/30/14   Jacklyn Shell, CNM  HYDROcodone-acetaminophen (NORCO/VICODIN) 5-325 MG per tablet Take 1 tablet by mouth every 6 (six) hours as needed. 08/09/14   Bethann Berkshire, MD  norgestimate-ethinyl estradiol (ORTHO-CYCLEN,SPRINTEC,PREVIFEM) 0.25-35 MG-MCG tablet Take 1 tablet by mouth daily. Patient not taking: Reported on 07/06/2014 04/30/14   Jacklyn Shell, CNM  phenazopyridine (PYRIDIUM) 200 MG tablet Take 1 tablet (200 mg total) by mouth 3 (three) times daily. Patient not taking: Reported on 08/09/2014 07/06/14   Harle Battiest, NP   BP 134/89 mmHg  Pulse 89  Temp(Src) 98.3 F (36.8 C) (Oral)  Resp 18  SpO2 100%  LMP 07/14/2014 (Approximate) Physical Exam  Constitutional: She is oriented to person, place, and time. She appears well-developed  and well-nourished. No distress.  HENT:  Head: Normocephalic and atraumatic.  Eyes: Conjunctivae and EOM are normal.  Neck: Neck supple. No tracheal deviation present.  Cardiovascular: Normal rate.   Pulmonary/Chest: Effort normal. No respiratory distress.  Musculoskeletal: Normal range of motion.  Lumbar and cervical paraspinal tenderness  Neurological: She is alert and oriented to person, place, and time. She exhibits normal muscle tone. Coordination normal.  Skin: Skin is warm and dry.  Psychiatric: She has a normal mood  and affect. Her behavior is normal.  Nursing note and vitals reviewed.   ED Course  Procedures (including critical care time)  DIAGNOSTIC STUDIES: Oxygen Saturation is 100% on RA, normal by my interpretation.    COORDINATION OF CARE: 11:03 AM - Discussed treatment plan with pt at bedside which includes Rx muscle relaxants and ortho referral, and pt agreed to plan.  MDM   Final diagnoses:  Neck pain  Back pain, unspecified location   Pt is neurologically intact. Don't think further imaging is needed at this time. Will send home with flexeril for  Pain  I personally performed the services described in this documentation, which was scribed in my presence. The recorded information has been reviewed and is accurate.     Teressa Lower, NP 08/12/14 1129  Jerelyn Scott, MD 08/12/14 [phone]

## 2014-08-12 NOTE — ED Notes (Signed)
Pt here for arm pain ,neck pain, back pain. sts a tree fell on her home a few days ago.

## 2014-10-11 ENCOUNTER — Emergency Department (HOSPITAL_COMMUNITY): Payer: Medicaid Other

## 2014-10-11 ENCOUNTER — Emergency Department (HOSPITAL_COMMUNITY)
Admission: EM | Admit: 2014-10-11 | Discharge: 2014-10-11 | Disposition: A | Discharge status: Home / Self Care | Payer: Medicaid Other | Attending: Emergency Medicine | Admitting: Emergency Medicine

## 2014-10-11 ENCOUNTER — Encounter (HOSPITAL_COMMUNITY): Payer: Self-pay | Admitting: *Deleted

## 2014-10-11 DIAGNOSIS — Y9389 Activity, other specified: Secondary | ICD-10-CM | POA: Diagnosis not present

## 2014-10-11 DIAGNOSIS — Y9289 Other specified places as the place of occurrence of the external cause: Secondary | ICD-10-CM | POA: Insufficient documentation

## 2014-10-11 DIAGNOSIS — Z87891 Personal history of nicotine dependence: Secondary | ICD-10-CM | POA: Diagnosis not present

## 2014-10-11 DIAGNOSIS — S0921XA Traumatic rupture of right ear drum, initial encounter: Secondary | ICD-10-CM | POA: Diagnosis not present

## 2014-10-11 DIAGNOSIS — S0003XA Contusion of scalp, initial encounter: Secondary | ICD-10-CM | POA: Insufficient documentation

## 2014-10-11 DIAGNOSIS — W19XXXA Unspecified fall, initial encounter: Secondary | ICD-10-CM

## 2014-10-11 DIAGNOSIS — Z79899 Other long term (current) drug therapy: Secondary | ICD-10-CM | POA: Diagnosis not present

## 2014-10-11 DIAGNOSIS — Z8619 Personal history of other infectious and parasitic diseases: Secondary | ICD-10-CM | POA: Diagnosis not present

## 2014-10-11 DIAGNOSIS — W01198A Fall on same level from slipping, tripping and stumbling with subsequent striking against other object, initial encounter: Secondary | ICD-10-CM | POA: Insufficient documentation

## 2014-10-11 DIAGNOSIS — Z8679 Personal history of other diseases of the circulatory system: Secondary | ICD-10-CM | POA: Insufficient documentation

## 2014-10-11 DIAGNOSIS — S060X1A Concussion with loss of consciousness of 30 minutes or less, initial encounter: Secondary | ICD-10-CM | POA: Insufficient documentation

## 2014-10-11 DIAGNOSIS — Z8742 Personal history of other diseases of the female genital tract: Secondary | ICD-10-CM | POA: Diagnosis not present

## 2014-10-11 DIAGNOSIS — S00401A Unspecified superficial injury of right ear, initial encounter: Secondary | ICD-10-CM | POA: Diagnosis present

## 2014-10-11 DIAGNOSIS — Y998 Other external cause status: Secondary | ICD-10-CM | POA: Insufficient documentation

## 2014-10-11 NOTE — ED Provider Notes (Signed)
CSN: [id number]     Arrival date & time 10/11/14  1722 History  This chart was scribed for non-physician practitioner working with Mirian Mo, MD by Murriel Hopper, ED Scribe. This patient was seen in room TR04C/TR04C and the patient's care was started at 5:56 PM.  Chief Complaint  Patient presents with  . Ear Injury      The history is provided by the patient. No language interpreter was used.     HPI Comments: [name] is a 29 y.o. female who presents to the Emergency Department complaining of constant right inner ear pain with associated trouble hearing that has been present since earlier today. Pt states she was jogging earlier today and tripped and then hit the right side of her head on the ground. Pt states that she lost consciousness for a few seconds and then her ear began to ring. Pt reports that she can currently hear but noises are muffled. Pt has pain to the right side of her head she denies trying to treat her pain at all.    Past Medical History  Diagnosis Date  . Irregular bleeding 01/17/2013  . Vaginal discharge 04/19/2013  . Yeast infection 04/19/2013  . Depression   . HSV infection    Past Surgical History  Procedure Laterality Date  . Ercp N/A 09/04/2012    Procedure: ENDOSCOPIC RETROGRADE CHOLANGIOPANCREATOGRAPHY (ERCP);  Surgeon: Rachael Fee, MD;  Location: [facility] OR;  Service: Endoscopy;  Laterality: N/A;  . Cholecystectomy N/A 09/05/2012    Procedure: LAPAROSCOPIC CHOLECYSTECTOMY;  Surgeon: Emelia Loron, MD;  Location: [facility] OR;  Service: General;  Laterality: N/A;   Family History  Problem Relation Age of Onset  . CAD Maternal Grandmother   . Hypertension Mother   . Diabetes Mother   . Breast cancer Maternal Aunt    Social History  Substance Use Topics  . Smoking status: Former Smoker -- 0.25 packs/day for 10 years    Types: Cigarettes    Quit date: 08/29/2012  . Smokeless tobacco: Never Used  . Alcohol Use: No   OB History    Gravida  Para Term Preterm AB TAB SAB Ectopic Multiple Living   0 4     Review of Systems  HENT: Positive for ear pain and hearing loss.   all other systems negative    Allergies  Review of patient's allergies indicates no known allergies.  Home Medications   Prior to Admission medications   Medication Sig Start Date End Date Taking? Authorizing Provider  cephALEXin (KEFLEX) 500 MG capsule Take 1 capsule (500 mg total) by mouth 4 (four) times daily. Patient not taking: Reported on 08/09/2014 07/06/14   Harle Battiest, NP  cyclobenzaprine (FLEXERIL) 10 MG tablet Take 1 tablet (10 mg total) by mouth 2 (two) times daily as needed for muscle spasms. 08/12/14   Teressa Lower, NP  fluconazole (DIFLUCAN) 150 MG tablet 1 po stat; repeat in 3 days Patient not taking: Reported on 07/06/2014 04/30/14   Jacklyn Shell, CNM  HYDROcodone-acetaminophen (NORCO/VICODIN) 5-325 MG per tablet Take 1 tablet by mouth every 6 (six) hours as needed. 08/09/14   Bethann Berkshire, MD  norgestimate-ethinyl estradiol (ORTHO-CYCLEN,SPRINTEC,PREVIFEM) 0.25-35 MG-MCG tablet Take 1 tablet by mouth daily. Patient not taking: Reported on 07/06/2014 04/30/14   Jacklyn Shell, CNM  phenazopyridine (PYRIDIUM) 200 MG tablet Take 1 tablet (200 mg total) by mouth 3 (three) times daily. Patient not taking: Reported on 08/09/2014 07/06/14   Lanora Manis  Tysinger, NP   BP 134/68 mmHg  Pulse 70  Temp(Src) 98 F (36.7 C) (Oral)  Resp 18  Ht 5\' 4"  (1.626 m)  Wt 173 lb 4 oz (78.586 kg)  BMI 29.72 kg/m2  SpO2 99%  LMP 10/04/2014 Physical Exam  Constitutional: She is oriented to person, place, and time. She appears well-developed and well-nourished.  HENT:  Right Ear: Tympanic membrane is perforated and erythematous. Decreased hearing is noted.  Left Ear: Tympanic membrane normal.  Nose: No nasal deformity. No epistaxis.  Mouth/Throat: Uvula is midline, oropharynx is clear and moist and mucous membranes are normal.   Tender on exam right ear and right side of head.  Eyes: Conjunctivae and EOM are normal. Pupils are equal, round, and reactive to light.  Neck: Normal range of motion. Neck supple.  Cardiovascular: Normal rate.   Pulmonary/Chest: Effort normal.  Abdominal: She exhibits no distension.  Musculoskeletal: Normal range of motion.  Neurological: She is alert and oriented to person, place, and time. She has normal strength. No sensory deficit. Gait normal.  Reflex Scores:      Bicep reflexes are 2+ on the right side and 2+ on the left side.      Patellar reflexes are 2+ on the right side and 2+ on the left side. Skin: Skin is warm and dry.  Psychiatric: She has a normal mood and affect. Her behavior is normal.  Nursing note and vitals reviewed.   ED Course  Procedures (including critical care time)  DIAGNOSTIC STUDIES: Oxygen Saturation is 98% on room air, normal by my interpretation.    COORDINATION OF CARE: 5:59 PM Discussed treatment plan with pt at bedside and pt agreed to plan.   Labs Review Labs Reviewed - No data to display  Imaging Review Ct Head Wo Contrast  10/11/2014   CLINICAL DATA:  Recent fall with right-sided head injury and loss of consciousness  EXAM: CT HEAD WITHOUT CONTRAST  TECHNIQUE: Contiguous axial images were obtained from the base of the skull through the vertex without intravenous contrast.  COMPARISON:  None.  FINDINGS: The bony calvarium is intact. No gross soft tissue abnormality is noted. No findings to suggest acute hemorrhage, acute infarction or space-occupying mass lesion are noted.  IMPRESSION: No acute intracranial abnormality   Electronically Signed   By: Alcide Clever M.D.   On: 10/11/2014 19:41   Dr. Littie Deeds in to examine the patient. She will take ibuprofen as needed for pain and follow with ENT. She will return here for any problems. Dr. Littie Deeds discussed in detail with the patient to return immediately if increased nausea/vomiting, visual changes,  severe headache or other problems.   MDM  29 y.o. female with decreased hearing and right ear pain s/p fall earlier today. Stable for d/c without focal neuro deficits and normal CT scan. She will follow up with ENT on Monday or return here sooner for any problems.   Final diagnoses:  Perforation of tympanic membrane, traumatic, right, initial encounter  Contusion of scalp, initial encounter  Fall, initial encounter  Concussion, with loss of consciousness of 30 minutes or less, initial encounter   I personally performed the services described in this documentation, which was scribed in my presence. The recorded information has been reviewed and is accurate.   [address] [place], [place] 10/11/14 2348  Mirian Mo, MD 10/15/14 [phone]

## 2014-10-11 NOTE — ED Notes (Signed)
The pt fell while running earlier today striking her rt ear on the ground .  It initially was ringing now she cannot hear.  New onset.  No pain lmp aug last week

## 2014-11-13 ENCOUNTER — Encounter: Payer: Self-pay | Admitting: Women's Health

## 2014-11-13 ENCOUNTER — Ambulatory Visit (INDEPENDENT_AMBULATORY_CARE_PROVIDER_SITE_OTHER): Payer: Medicaid Other | Admitting: Women's Health

## 2014-11-13 VITALS — BP 108/60 | HR 72 | Wt 172.0 lb

## 2014-11-13 DIAGNOSIS — N898 Other specified noninflammatory disorders of vagina: Secondary | ICD-10-CM | POA: Diagnosis not present

## 2014-11-13 DIAGNOSIS — L292 Pruritus vulvae: Secondary | ICD-10-CM | POA: Diagnosis not present

## 2014-11-13 DIAGNOSIS — R109 Unspecified abdominal pain: Secondary | ICD-10-CM | POA: Diagnosis not present

## 2014-11-13 LAB — POCT WET PREP (WET MOUNT): CLUE CELLS WET PREP WHIFF POC: POSITIVE

## 2014-11-13 MED ORDER — METRONIDAZOLE 500 MG PO TABS: 500.0000 mg | ORAL_TABLET | Freq: Two times a day (BID) | ORAL | Status: DC

## 2014-11-13 NOTE — Progress Notes (Signed)
Patient ID: [name], female   DOB: [birth year], 29 y.o.   MRN: [id number]   [facility] Visit  Patient name: [name] MRN [id number]  Date of birth: 03-23-1985  CC & HPI:  [name] is a 29 y.o. [phone] [place] American female presenting today for report of yellowish d/c w/ odor and vulvovaginal itching x 4-5 days. Some abd cramping.  Patient's last menstrual period was 10/28/2014. The current method of family planning is partner had vasectomy, but did not go back for proof of sterilization, pt has rx for COCs at her pharmacy. Last pap Feb 2014 and was normal  Pertinent History Reviewed:  Medical & Surgical Hx:   Past Medical History  Diagnosis Date  . Irregular bleeding 01/17/2013  . Vaginal discharge 04/19/2013  . Yeast infection 04/19/2013  . Depression   . HSV infection    Past Surgical History  Procedure Laterality Date  . Ercp N/A 09/04/2012    Procedure: ENDOSCOPIC RETROGRADE CHOLANGIOPANCREATOGRAPHY (ERCP);  Surgeon: Rachael Fee, MD;  Location: [facility] OR;  Service: Endoscopy;  Laterality: N/A;  . Cholecystectomy N/A 09/05/2012    Procedure: LAPAROSCOPIC CHOLECYSTECTOMY;  Surgeon: Emelia Loron, MD;  Location: [facility] OR;  Service: General;  Laterality: N/A;   Medications: Reviewed & Updated - see associated section Social History: Reviewed -  reports that she quit smoking about 2 years ago. Her smoking use included Cigarettes. She has a 2.5 pack-year smoking history. She has never used smokeless tobacco.  Objective Findings:  Vitals: BP 108/60 mmHg  Pulse 72  Wt 172 lb (78.019 kg)  LMP 10/28/2014 Body mass index is 29.51 kg/(m^2).  Physical Examination: General appearance - alert, well appearing, and in no distress Pelvic - cx closed w/o abnormalities, small amount thin yellow slightly malodorous d/c  Results for orders placed or performed in visit on 11/13/14 (from the past 24 hour(s))  POCT Wet Prep Mellody Drown Mayking)   Collection Time:  11/13/14  3:27 PM  Result Value Ref Range   Source Wet Prep POC vaginal    WBC, Wet Prep HPF POC none    Bacteria Wet Prep HPF POC None None, Few   BACTERIA WET PREP MORPHOLOGY POC     Clue Cells Wet Prep HPF POC Few (A) None   Clue Cells Wet Prep Whiff POC Positive Whiff    Yeast Wet Prep HPF POC None    KOH Wet Prep POC     Trichomonas Wet Prep HPF POC none      Assessment & Plan:  A:   BV  P:  GC/CT from urine  Rx metronidazole  BID x 7d for BV, no sex or etoh while taking   Start taking COCs, have partner go back for proof of sterilization- if neg can stop COCs  Return for Feb 2017 for , Pap & physical.  Marge Duncans CNM, [facility] 11/13/2014 3:28 PM

## 2014-11-13 NOTE — Patient Instructions (Signed)
PICK UP YOUR BIRTH CONTROL PILLS AND START TAKING THEM HAVE YOUR PARTNER GO BACK TO GET CHECKED TO MAKE SURE HE IS STERILE

## 2014-11-14 ENCOUNTER — Ambulatory Visit (INDEPENDENT_AMBULATORY_CARE_PROVIDER_SITE_OTHER): Payer: Medicaid Other | Admitting: Otolaryngology

## 2014-11-14 DIAGNOSIS — H7203 Central perforation of tympanic membrane, bilateral: Secondary | ICD-10-CM | POA: Diagnosis not present

## 2014-11-14 DIAGNOSIS — H93293 Other abnormal auditory perceptions, bilateral: Secondary | ICD-10-CM

## 2014-11-14 LAB — GC/CHLAMYDIA PROBE AMP
Chlamydia trachomatis, NAA: NEGATIVE
Neisseria gonorrhoeae by PCR: NEGATIVE

## 2015-01-11 ENCOUNTER — Other Ambulatory Visit: Payer: Self-pay | Admitting: Women's Health

## 2015-01-14 ENCOUNTER — Telehealth: Payer: Self-pay | Admitting: Obstetrics and Gynecology

## 2015-01-14 NOTE — Telephone Encounter (Signed)
Per Joellyn HaffKim Booker pt should be seen before refilling any medication. Pt advised of this and phone call was switched to front office and appointment was given.

## 2015-01-15 ENCOUNTER — Ambulatory Visit (INDEPENDENT_AMBULATORY_CARE_PROVIDER_SITE_OTHER): Payer: Medicaid Other | Admitting: Obstetrics and Gynecology

## 2015-01-15 ENCOUNTER — Encounter: Payer: Self-pay | Admitting: Obstetrics and Gynecology

## 2015-01-15 VITALS — BP 118/68 | Ht 64.0 in | Wt 177.5 lb

## 2015-01-15 DIAGNOSIS — A499 Bacterial infection, unspecified: Secondary | ICD-10-CM

## 2015-01-15 DIAGNOSIS — N760 Acute vaginitis: Secondary | ICD-10-CM

## 2015-01-15 DIAGNOSIS — B9689 Other specified bacterial agents as the cause of diseases classified elsewhere: Secondary | ICD-10-CM

## 2015-01-15 MED ORDER — METRONIDAZOLE 500 MG PO TABS: 500.0000 mg | ORAL_TABLET | Freq: Two times a day (BID) | ORAL | Status: DC

## 2015-01-15 MED ORDER — KETOCONAZOLE 2 % EX SHAM: 1.0000 "application " | MEDICATED_SHAMPOO | CUTANEOUS | Status: DC

## 2015-01-15 NOTE — Progress Notes (Signed)
Patient ID: Sonia BrinkShavonne J Kassebaum, female   DOB: 26-Jun-1985, 29 y.o.   MRN: [id number] Pt here today for vaginal discharge. Pt states that she has had it on and off now for the past week.

## 2015-01-15 NOTE — Progress Notes (Signed)
Patient ID: [name], female   DOB: December 17, 1985, 29 y.o.   MRN: [id number]    [facility]  Patient name: [name] MRN [id number]  Date of birth: December 17, 1985  CC & HPI:  [name] is a 29 y.o. female presenting today for intermittent vaginal discharge and itching in the bilateral groin. She denies any itching to the vagina. Pt notes there was an odor the first couple of days but she reports it is now gone. Pt reports she has been in a relationship for 1 year and had STI testing done 2 months ago which were negative.   ROS:  10 Systems reviewed and all are negative for acute change except as noted in the HPI.  Pertinent History Reviewed:   Reviewed: Significant for depression and HSV infection Medical         Past Medical History  Diagnosis Date  . Irregular bleeding 01/17/2013  . Vaginal discharge 04/19/2013  . Yeast infection 04/19/2013  . Depression   . HSV infection                               Surgical Hx:    Past Surgical History  Procedure Laterality Date  . Ercp N/A 09/04/2012    Procedure: ENDOSCOPIC RETROGRADE CHOLANGIOPANCREATOGRAPHY (ERCP);  Surgeon: Rachael Feeaniel P Jacobs, MD;  [facility];  Service: Endoscopy;  Laterality: N/A;  . Cholecystectomy N/A 09/05/2012    Procedure: LAPAROSCOPIC CHOLECYSTECTOMY;  Surgeon: Emelia LoronMatthew Wakefield, MD;  [facility];  Service: General;  Laterality: N/A;   Medications: Reviewed & Updated - see associated section                      No current outpatient prescriptions on file.   Social History: Reviewed -  reports that she quit smoking about 2 years ago. Her smoking use included Cigarettes. She has a 2.5 pack-year smoking history. She has never used smokeless tobacco.  Objective Findings:  Vitals: Blood pressure 118/68, height 5\' 4"  (1.626 m), weight 177 lb 8 oz (80.513 kg), last menstrual period 01/04/2015, not currently breastfeeding.  Physical Examination: General appearance - alert, well  appearing, and in no distress Mental status - alert, oriented to person, place, and time Pelvic - Normal external genitalia except for small 2cm area to left inguinal crease that is excoriated CERVIX: normal in appearance, heavy discharge. Neurological - alert, oriented, normal speech, no focal findings or movement disorder noted Musculoskeletal - no joint tenderness, deformity or swelling Extremities - peripheral pulses normal, no pedal edema, no clubbing or cyanosis   Assessment & Plan:   A:  1. Bacterial Vaginosis  2. Tinea corporis right groin  P:  1. Will give rx for Flagyl BID x7 days 2. Keoconazole 2% 2x week  By signing my name below, I, Jarvis Morganaylor Trinidad Ingle, attest that this documentation has been prepared under the direction and in the presence of Tilda BurrowJohn Mckynlie Vanderslice V, MD. Electronically Signed: Jarvis Morganaylor Teairra Millar, ED Scribe. 01/15/2015. 2:10 PM.  I personally performed the services described in this documentation, which was SCRIBED in my pr3ence. The recorded information has been reviewed and considered accurate. It has been edited as necessary during review. Tilda BurrowFERGUSON,Daje Stark V, MD

## 2015-01-21 ENCOUNTER — Encounter (HOSPITAL_COMMUNITY): Payer: Self-pay | Admitting: Emergency Medicine

## 2015-01-21 ENCOUNTER — Emergency Department (HOSPITAL_COMMUNITY)
Admission: EM | Admit: 2015-01-21 | Discharge: 2015-01-21 | Disposition: A | Discharge status: Home / Self Care | Payer: Medicaid Other | Attending: Emergency Medicine | Admitting: Emergency Medicine

## 2015-01-21 DIAGNOSIS — N644 Mastodynia: Secondary | ICD-10-CM

## 2015-01-21 DIAGNOSIS — R52 Pain, unspecified: Secondary | ICD-10-CM

## 2015-01-21 DIAGNOSIS — Z8619 Personal history of other infectious and parasitic diseases: Secondary | ICD-10-CM | POA: Diagnosis not present

## 2015-01-21 DIAGNOSIS — Z87891 Personal history of nicotine dependence: Secondary | ICD-10-CM | POA: Insufficient documentation

## 2015-01-21 DIAGNOSIS — Z79899 Other long term (current) drug therapy: Secondary | ICD-10-CM | POA: Diagnosis not present

## 2015-01-21 DIAGNOSIS — F329 Major depressive disorder, single episode, unspecified: Secondary | ICD-10-CM | POA: Diagnosis not present

## 2015-01-21 DIAGNOSIS — Z792 Long term (current) use of antibiotics: Secondary | ICD-10-CM | POA: Insufficient documentation

## 2015-01-21 MED ORDER — IBUPROFEN 600 MG PO TABS: 600.0000 mg | ORAL_TABLET | Freq: Four times a day (QID) | ORAL | Status: DC | PRN

## 2015-01-21 MED ORDER — CLINDAMYCIN HCL 150 MG PO CAPS
300.0000 mg | ORAL_CAPSULE | Freq: Once | ORAL | Status: AC
  Administered 2015-01-21: 300 mg via ORAL
  Filled 2015-01-21: qty 2

## 2015-01-21 MED ORDER — HYDROCODONE-ACETAMINOPHEN 5-325 MG PO TABS: 1.0000 | ORAL_TABLET | ORAL | Status: DC | PRN

## 2015-01-21 MED ORDER — CLINDAMYCIN HCL 150 MG PO CAPS: 150.0000 mg | ORAL_CAPSULE | Freq: Four times a day (QID) | ORAL | Status: DC

## 2015-01-21 MED ORDER — IBUPROFEN 800 MG PO TABS
800.0000 mg | ORAL_TABLET | Freq: Once | ORAL | Status: AC
  Administered 2015-01-21: 800 mg via ORAL
  Filled 2015-01-21: qty 1

## 2015-01-21 NOTE — Discharge Instructions (Signed)
Your examination suggest possible bruise to the breasts versus mastitis. Please use warm tub soaks daily until this resolves. Please use 600 mg of ibuprofen every 6 hours over the next 3 days, then as needed for discomfort. Use clindamycin with breakfast, lunch, dinner, and bedtime. May use Norco for more severe pain. Norco may cause drowsiness, please use with caution. Please see your doctor for a mammogram if this is not resolving soon. Breast Tenderness Breast tenderness is a common problem for women of all ages. Breast tenderness may cause mild discomfort to severe pain. It has a variety of causes. Your health care provider will find out the likely cause of your breast tenderness by examining your breasts, asking you about symptoms, and ordering some tests. Breast tenderness usually does not mean you have breast cancer. HOME CARE INSTRUCTIONS  Breast tenderness often can be handled at home. You can try:  Getting fitted for a new bra that provides more support, especially during exercise.  Wearing a more supportive bra or sports bra while sleeping when your breasts are very tender.  If you have a breast injury, apply ice to the area:  Put ice in a plastic bag.  Place a towel between your skin and the bag.  Leave the ice on for 20 minutes, 2-3 times a day.  If your breasts are too full of milk as a result of breastfeeding, try:  Expressing milk either by hand or with a breast pump.  Applying a warm compress to the breasts for relief.  Taking over-the-counter pain relievers, if approved by your health care provider.  Taking other medicines that your health care provider prescribes. These may include antibiotic medicines or birth control pills. Over the long term, your breast tenderness might be eased if you:  Cut down on caffeine.  Reduce the amount of fat in your diet. Keep a log of the days and times when your breasts are most tender. This will help you and your health care  provider find the cause of the tenderness and how to relieve it. Also, learn how to do breast exams at home. This will help you notice if you have an unusual growth or lump that could cause tenderness. SEEK MEDICAL CARE IF:   Any part of your breast is hard, red, and hot to the touch. This could be a sign of infection.  Fluid is coming out of your nipples (and you are not breastfeeding). Especially watch for blood or pus.  You have a fever as well as breast tenderness.  You have a new or painful lump in your breast that remains after your menstrual period ends.  You have tried to take care of the pain at home, but it has not gone away.  Your breast pain is getting worse, or the pain is making it hard to do the things you usually do during your day.   This information is not intended to replace advice given to you by your health care provider. Make sure you discuss any questions you have with your health care provider.   Document Released: 01/29/2008 Document Revised: 10/18/2012 Document Reviewed: 09/14/2012 Elsevier Interactive Patient Education [employer]2016 [employer]

## 2015-01-21 NOTE — ED Provider Notes (Signed)
CSN: [id number]     Arrival date & time 01/21/15  1425 History   First MD Initiated Contact with Patient 01/21/15 1519     Chief Complaint  Patient presents with  . Generalized Body Aches     (Consider location/radiation/quality/duration/timing/severity/associated sxs/prior Treatment) HPI Comments: Patient is a 29 year old female who presents to the emergency department with a complaint of left breast pain, and body aches.  The patient states that earlier today she began to have body aches. She describes it as a sensation as though she is about to come down with the flu. Over the last few days she has not had temperature elevation. There's been no sore throat, nasal congestion, cough, or abdominal discomfort.  The patient states that on last evening she felt a area of pain under the left breast. Today there is pain from the lower nipple and just to the area under the left breast. She felt that at one point it felt firm to touch. No drainage from the nipple area. The patient is not breast-feeding. It is of note that she recently gave birth about a year ago, but is not breast-feeding at this time. She presents now for assistance with this problem.  The history is provided by the patient.    Past Medical History  Diagnosis Date  . Irregular bleeding 01/17/2013  . Vaginal discharge 04/19/2013  . Yeast infection 04/19/2013  . Depression   . HSV infection    Past Surgical History  Procedure Laterality Date  . Ercp N/A 09/04/2012    Procedure: ENDOSCOPIC RETROGRADE CHOLANGIOPANCREATOGRAPHY (ERCP);  Surgeon: Rachael Feeaniel P Jacobs, MD;  [facility];  Service: Endoscopy;  Laterality: N/A;  . Cholecystectomy N/A 09/05/2012    Procedure: LAPAROSCOPIC CHOLECYSTECTOMY;  Surgeon: Emelia LoronMatthew Wakefield, MD;  [facility];  Service: General;  Laterality: N/A;   Family History  Problem Relation Age of Onset  . CAD Maternal Grandmother   . Hypertension Mother   . Diabetes Mother   . Breast cancer  Maternal Aunt    Social History  Substance Use Topics  . Smoking status: Former Smoker -- 0.25 packs/day for 10 years    Types: Cigarettes    Quit date: 08/29/2012  . Smokeless tobacco: Never Used  . Alcohol Use: No   OB History    Gravida Para Term Preterm AB TAB SAB Ectopic Multiple Living   4 4 4       0 4     Review of Systems  Genitourinary:       Left breast pain.  Psychiatric/Behavioral:       Depressed  All other systems reviewed and are negative.     Allergies  Review of patient's allergies indicates no known allergies.  Home Medications   Prior to Admission medications   Medication Sig Start Date End Date Taking? Authorizing Provider  ketoconazole (NIZORAL) 2 % shampoo Apply 1 application topically 2 (two) times a week. 01/15/15   Tilda BurrowJohn Ferguson V, MD  metroNIDAZOLE (FLAGYL) 500 MG tablet Take 1 tablet (500 mg total) by mouth 2 (two) times daily. 01/15/15   Tilda BurrowJohn Ferguson V, MD   BP 129/62 mmHg  Pulse 71  Temp(Src) 98.9 F (37.2 C) (Oral)  Resp 16  Ht 5\' 4"  (1.626 m)  Wt 80.74 kg  BMI 30.54 kg/m2  SpO2 98%  LMP 01/04/2015  Breastfeeding? No Physical Exam  Constitutional: She is oriented to person, place, and time. She appears well-developed and well-nourished.  Non-toxic appearance.  HENT:  Head: Normocephalic.  Right Ear: Tympanic membrane and external ear normal.  Left Ear: Tympanic membrane and external ear normal.  Eyes: EOM and lids are normal. Pupils are equal, round, and reactive to light.  Neck: Normal range of motion. Neck supple. Carotid bruit is not present.  Cardiovascular: Normal rate, regular rhythm, normal heart sounds, intact distal pulses and normal pulses.   Pulmonary/Chest: Breath sounds normal. No respiratory distress.  Chaperone present during the examination.  There is no discharge from the left nipple. There is no mass appreciated. The breasts are symmetrical. There is tenderness at the 6 and 7:00 position.  Abdominal: Soft.  Bowel sounds are normal. There is no tenderness. There is no guarding.  Musculoskeletal: Normal range of motion.  Lymphadenopathy:       Head (right side): No submandibular adenopathy present.       Head (left side): No submandibular adenopathy present.    She has no cervical adenopathy.  Neurological: She is alert and oriented to person, place, and time. She has normal strength. No cranial nerve deficit or sensory deficit.  Skin: Skin is warm and dry.  Psychiatric: She has a normal mood and affect. Her speech is normal.  Nursing note and vitals reviewed.   ED Course  Procedures (including critical care time) Labs Review Labs Reviewed - No data to display  Imaging Review No results found. I have personally reviewed and evaluated these images and lab results as part of my medical decision-making.   EKG Interpretation None      MDM  Vital signs are well within normal limits. Suspect either the patient has a a cold bruise, or possible early mastitis. The patient will be treated with clindamycin, ibuprofen, and Norco. Patient is to follow with primary physician for a mammogram if not improving.    Final diagnoses:  None    **I have reviewed nursing notes, vital signs, and all appropriate lab and imaging results for this patient.Ivery Quale, PA-C 01/21/15 1628  Ivery Quale, PA-C 01/21/15 1630  Samuel Jester, DO 01/23/15 2100

## 2015-01-21 NOTE — ED Notes (Signed)
Pt reports left breast "hardness" and pain as well as generalized body aches. Pt denies any n/v,fever.

## 2015-01-21 NOTE — ED Notes (Signed)
Pt verbalized understanding of no driving and to use caution within 4 hours of taking pain meds due to meds cause drowsiness.  Instructed pt to take all of antibiotics as prescribed. 

## 2015-01-21 NOTE — ED Notes (Signed)
This nurse present during EDPA's exam of pt's breast

## 2015-04-03 ENCOUNTER — Encounter: Payer: Self-pay | Admitting: Obstetrics & Gynecology

## 2015-04-03 ENCOUNTER — Ambulatory Visit (INDEPENDENT_AMBULATORY_CARE_PROVIDER_SITE_OTHER): Payer: Medicaid Other | Admitting: Obstetrics & Gynecology

## 2015-04-03 VITALS — BP 110/80 | HR 80 | Wt 176.0 lb

## 2015-04-03 DIAGNOSIS — N644 Mastodynia: Secondary | ICD-10-CM | POA: Diagnosis not present

## 2015-04-03 DIAGNOSIS — N6320 Unspecified lump in the left breast, unspecified quadrant: Secondary | ICD-10-CM

## 2015-04-03 DIAGNOSIS — N63 Unspecified lump in breast: Secondary | ICD-10-CM

## 2015-04-03 NOTE — Progress Notes (Signed)
Patient ID: [name], female   DOB: 23-Jul-1985, 30 y.o.   MRN: [id number]      Chief Complaint  Patient presents with  . gyn visit    left breast pain    Blood pressure 110/80, pulse 80, weight 176 lb (79.833 kg), last menstrual period 03/13/2015, not currently breastfeeding.  30 y.o. [id number] Patient's last menstrual period was 03/13/2015. The current method of family planning is none.  Subjective Left breast mass and pain for 2 months  Objective Left breast with area at 3 o clock which is tender and thickened consistent with fibrocystic breast changes, no infection noted Right breast is normal  Pertinent ROS No burning with urination, frequency or urgency No nausea, vomiting or diarrhea Nor fever chills or other constitutional symptoms   Labs or studies     Impression Diagnoses this Encounter::   ICD-9-CM ICD-10-CM   1. Left breast mass 611.72 N63 MM Digital Diagnostic Bilat     US BREAST LTD UNI LEFT INC AXILLA     US BREAST COMPLETE UNI RIGHT INC AXILLA     MM DIAG BREAST TOMO BILATERAL     US BREAST LTD UNI RIGHT INC AXILLA  2. Breast pain, left 611.71 N64.4     Established relevant diagnosis(es):   Plan/Recommendations: No orders of the defined types were placed in this encounter.    Labs or Scans Ordered: Orders Placed This Encounter  Procedures  . MM Digital Diagnostic Bilat  . US BREAST LTD UNI LEFT INC AXILLA  . US BREAST COMPLETE UNI RIGHT INC AXILLA  . MM DIAG BREAST TOMO BILATERAL  . US BREAST LTD UNI RIGHT INC AXILLA    Management::   Follow up  Return if symptoms worsen or fail to improve.        Face to face time:  15 minutes  Greater than 50% of the visit time was spent in counseling and coordination of care with the patient.  The summary and outline of the counseling and care coordination is summarized in the note above.   All questions were answered.

## 2015-04-10 ENCOUNTER — Encounter: Payer: Self-pay | Admitting: Adult Health

## 2015-04-10 ENCOUNTER — Other Ambulatory Visit (HOSPITAL_COMMUNITY)
Admission: RE | Admit: 2015-04-10 | Discharge: 2015-04-10 | Disposition: A | Discharge status: Home / Self Care | Payer: Medicaid Other | Source: Ambulatory Visit | Attending: Adult Health | Admitting: Adult Health

## 2015-04-10 ENCOUNTER — Ambulatory Visit (INDEPENDENT_AMBULATORY_CARE_PROVIDER_SITE_OTHER): Payer: Medicaid Other | Admitting: Adult Health

## 2015-04-10 VITALS — BP 122/80 | HR 66 | Ht 63.5 in | Wt 180.0 lb

## 2015-04-10 DIAGNOSIS — Z113 Encounter for screening for infections with a predominantly sexual mode of transmission: Secondary | ICD-10-CM

## 2015-04-10 DIAGNOSIS — N898 Other specified noninflammatory disorders of vagina: Secondary | ICD-10-CM | POA: Diagnosis not present

## 2015-04-10 DIAGNOSIS — N760 Acute vaginitis: Secondary | ICD-10-CM

## 2015-04-10 DIAGNOSIS — Z1389 Encounter for screening for other disorder: Secondary | ICD-10-CM | POA: Diagnosis not present

## 2015-04-10 DIAGNOSIS — N9489 Other specified conditions associated with female genital organs and menstrual cycle: Secondary | ICD-10-CM

## 2015-04-10 DIAGNOSIS — Z01419 Encounter for gynecological examination (general) (routine) without abnormal findings: Secondary | ICD-10-CM | POA: Diagnosis present

## 2015-04-10 DIAGNOSIS — R350 Frequency of micturition: Secondary | ICD-10-CM

## 2015-04-10 DIAGNOSIS — Z1151 Encounter for screening for human papillomavirus (HPV): Secondary | ICD-10-CM | POA: Insufficient documentation

## 2015-04-10 DIAGNOSIS — Z3202 Encounter for pregnancy test, result negative: Secondary | ICD-10-CM

## 2015-04-10 DIAGNOSIS — N6320 Unspecified lump in the left breast, unspecified quadrant: Secondary | ICD-10-CM

## 2015-04-10 DIAGNOSIS — Z124 Encounter for screening for malignant neoplasm of cervix: Secondary | ICD-10-CM

## 2015-04-10 DIAGNOSIS — Z0000 Encounter for general adult medical examination without abnormal findings: Secondary | ICD-10-CM

## 2015-04-10 DIAGNOSIS — B9689 Other specified bacterial agents as the cause of diseases classified elsewhere: Secondary | ICD-10-CM | POA: Insufficient documentation

## 2015-04-10 DIAGNOSIS — Z30011 Encounter for initial prescription of contraceptive pills: Secondary | ICD-10-CM

## 2015-04-10 HISTORY — DX: Other specified noninflammatory disorders of vagina: N89.8

## 2015-04-10 HISTORY — DX: Other specified bacterial agents as the cause of diseases classified elsewhere: B96.89

## 2015-04-10 HISTORY — DX: Frequency of micturition: R35.0

## 2015-04-10 LAB — POCT URINALYSIS DIPSTICK
Glucose, UA: NEGATIVE
Leukocytes, UA: NEGATIVE
NITRITE UA: NEGATIVE
Protein, UA: NEGATIVE
RBC UA: NEGATIVE

## 2015-04-10 LAB — POCT URINE PREGNANCY: Preg Test, Ur: NEGATIVE

## 2015-04-10 LAB — POCT WET PREP (WET MOUNT): Clue Cells Wet Prep Whiff POC: POSITIVE

## 2015-04-10 MED ORDER — METRONIDAZOLE 500 MG PO TABS: 500.0000 mg | ORAL_TABLET | Freq: Two times a day (BID) | ORAL | Status: DC

## 2015-04-10 MED ORDER — NORETHIN-ETH ESTRAD-FE BIPHAS 1 MG-10 MCG / 10 MCG PO TABS: 1.0000 | ORAL_TABLET | Freq: Every day | ORAL | Status: DC

## 2015-04-10 NOTE — Progress Notes (Signed)
Patient ID: [name], female   DOB: [birth year], 30 y.o.   MRN: [id number] History of Present Illness: [name] is a 30 year old black female, in for a well woman gyn exam and pap,she complains of vaginal discharge, urinary frequency and wants to get on OCs.   Current Medications, Allergies, Past Medical History, Past Surgical History, Family History and Social History were reviewed in [employer] record.     Review of Systems:  Patient denies any headaches, hearing loss, fatigue, blurred vision, shortness of breath, chest pain, abdominal pain, problems with bowel movements, urination, or intercourse. No joint pain or mood swings.See HPI for positives.   Physical Exam:BP 122/80 mmHg  Pulse 66  Ht 5' 3.5" (1.613 m)  Wt 180 lb (81.647 kg)  BMI 31.38 kg/m2  LMP 03/20/2015 (Approximate)  Breastfeeding? No UPT negative, urine dipstick negative General:  Well developed, well nourished, no acute distress Skin:  Warm and dry Neck:  Midline trachea, normal thyroid, good ROM, no lymphadenopathy Lungs; Clear to auscultation bilaterally Breast:  No dominant palpable mass, retraction, or nipple discharge, on right, has mass at about 6 o'clock has mammogram 2/17 she says, had seen Dr Despina Hidden earlier for this, no retraction or nipple discharge on left  Cardiovascular: Regular rate and rhythm Abdomen:  Soft, non tender, no hepatosplenomegaly Pelvic:  External genitalia is normal in appearance, no lesions.  The vagina is normal in appearance, with creamy discharge and odor. Urethra has no lesions or masses. The cervix is bulbous.Pap with GC/CHL and HPV performed, wet prep shows +WBC and Clue cells.  Uterus is felt to be normal size, shape, and contour.  No adnexal masses or tenderness noted.Bladder is non tender, no masses felt. Extremities/musculoskeletal:  No swelling or varicosities noted, no clubbing or cyanosis Psych:  No mood changes, alert and cooperative,seems  happy   Impression: Well woman gyn exam and pap Vaginal discharge  BV Urinary frequency  Left breast mass STD screening Contraceptive management   Plan: Rx flagyl 500 mg 1 bid x 7 days, no alcohol, review handout on BV   Rx lo loestrin disp 1 pack take 1 daily with 11 refills, start with next period, use condoms Check CBC,CMP,TSH and lipids, A1c, vitamin D, HIV and RPR Physical in 1 year, pap in 3 if normal Get mammogram as scheduled

## 2015-04-10 NOTE — Patient Instructions (Addendum)
Start lo loestrin with next period use condoms Physical in 1 year Bacterial Vaginosis Bacterial vaginosis is a vaginal infection that occurs when the normal balance of bacteria in the vagina is disrupted. It results from an overgrowth of certain bacteria. This is the most common vaginal infection in women of childbearing age. Treatment is important to prevent complications, especially in pregnant women, as it can cause a premature delivery. CAUSES  Bacterial vaginosis is caused by an increase in harmful bacteria that are normally present in smaller amounts in the vagina. Several different kinds of bacteria can cause bacterial vaginosis. However, the reason that the condition develops is not fully understood. RISK FACTORS Certain activities or behaviors can put you at an increased risk of developing bacterial vaginosis, including:  Having a new sex partner or multiple sex partners.  Douching.  Using an intrauterine device (IUD) for contraception. Women do not get bacterial vaginosis from toilet seats, bedding, swimming pools, or contact with objects around them. SIGNS AND SYMPTOMS  Some women with bacterial vaginosis have no signs or symptoms. Common symptoms include:  Grey vaginal discharge.  A fishlike odor with discharge, especially after sexual intercourse.  Itching or burning of the vagina and vulva.  Burning or pain with urination. DIAGNOSIS  Your health care provider will take a medical history and examine the vagina for signs of bacterial vaginosis. A sample of vaginal fluid may be taken. Your health care provider will look at this sample under a microscope to check for bacteria and abnormal cells. A vaginal pH test may also be done.  TREATMENT  Bacterial vaginosis may be treated with antibiotic medicines. These may be given in the form of a pill or a vaginal cream. A second round of antibiotics may be prescribed if the condition comes back after treatment. Because bacterial  vaginosis increases your risk for sexually transmitted diseases, getting treated can help reduce your risk for chlamydia, gonorrhea, HIV, and herpes. HOME CARE INSTRUCTIONS   Only take over-the-counter or prescription medicines as directed by your health care provider.  If antibiotic medicine was prescribed, take it as directed. Make sure you finish it even if you start to feel better.  Tell all sexual partners that you have a vaginal infection. They should see their health care provider and be treated if they have problems, such as a mild rash or itching.  During treatment, it is important that you follow these instructions:  Avoid sexual activity or use condoms correctly.  Do not douche.  Avoid alcohol as directed by your health care provider.  Avoid breastfeeding as directed by your health care provider. SEEK MEDICAL CARE IF:   Your symptoms are not improving after 3 days of treatment.  You have increased discharge or pain.  You have a fever. MAKE SURE YOU:   Understand these instructions.  Will watch your condition.  Will get help right away if you are not doing well or get worse. FOR MORE INFORMATION  Centers for Disease Control and Prevention, Division of STD Prevention: [employer].za American Sexual Health Association (ASHA): www.ashastd.org    This information is not intended to replace advice given to you by your health care provider. Make sure you discuss any questions you have with your health care provider.   Document Released: 02/15/2005 Document Revised: 03/08/2014 Document Reviewed: 09/27/2012 Elsevier Interactive Patient Education 2016 [employer]. No alcohol  No sex

## 2015-04-11 ENCOUNTER — Encounter: Payer: Self-pay | Admitting: Adult Health

## 2015-04-11 ENCOUNTER — Telehealth: Payer: Self-pay | Admitting: Adult Health

## 2015-04-11 DIAGNOSIS — E559 Vitamin D deficiency, unspecified: Secondary | ICD-10-CM | POA: Insufficient documentation

## 2015-04-11 HISTORY — DX: Vitamin D deficiency, unspecified: E55.9

## 2015-04-11 LAB — CBC
Hematocrit: 39.7 % (ref 34.0–46.6)
Hemoglobin: 13.3 g/dL (ref 11.1–15.9)
MCH: 30.2 pg (ref 26.6–33.0)
MCHC: 33.5 g/dL (ref 31.5–35.7)
MCV: 90 fL (ref 79–97)
PLATELETS: 329 10*3/uL (ref 150–379)
RBC: 4.4 x10E6/uL (ref 3.77–5.28)
RDW: 13.8 % (ref 12.3–15.4)
WBC: 6.9 10*3/uL (ref 3.4–10.8)

## 2015-04-11 LAB — COMPREHENSIVE METABOLIC PANEL
ALK PHOS: 60 IU/L (ref 39–117)
ALT: 11 IU/L (ref 0–32)
AST: 13 IU/L (ref 0–40)
Albumin/Globulin Ratio: 1.8 (ref 1.1–2.5)
Albumin: 4.2 g/dL (ref 3.5–5.5)
BUN / CREAT RATIO: 11 (ref 8–20)
BUN: 7 mg/dL (ref 6–20)
Bilirubin Total: 0.5 mg/dL (ref 0.0–1.2)
CO2: 25 mmol/L (ref 18–29)
Calcium: 9 mg/dL (ref 8.7–10.2)
Chloride: 102 mmol/L (ref 96–106)
Creatinine, Ser: 0.66 mg/dL (ref 0.57–1.00)
GFR calc Af Amer: 137 mL/min/{1.73_m2} (ref >59)
GFR calc non Af Amer: 119 mL/min/{1.73_m2} (ref >59)
GLUCOSE: 93 mg/dL (ref 65–99)
Globulin, Total: 2.4 g/dL (ref 1.5–4.5)
Potassium: 4.6 mmol/L (ref 3.5–5.2)
Sodium: 142 mmol/L (ref 134–144)
Total Protein: 6.6 g/dL (ref 6.0–8.5)

## 2015-04-11 LAB — LIPID PANEL
Chol/HDL Ratio: 4.2 ratio units (ref 0.0–4.4)
Cholesterol, Total: 217 mg/dL — ABNORMAL HIGH (ref 100–199)
HDL: 52 mg/dL (ref >39)
LDL Calculated: 150 mg/dL — ABNORMAL HIGH (ref 0–99)
Triglycerides: 74 mg/dL (ref 0–149)
VLDL Cholesterol Cal: 15 mg/dL (ref 5–40)

## 2015-04-11 LAB — VITAMIN D 25 HYDROXY (VIT D DEFICIENCY, FRACTURES): VIT D 25 HYDROXY: 13 ng/mL — AB (ref 30.0–100.0)

## 2015-04-11 LAB — HEMOGLOBIN A1C
Est. average glucose Bld gHb Est-mCnc: 117 mg/dL
HEMOGLOBIN A1C: 5.7 % — AB (ref 4.8–5.6)

## 2015-04-11 LAB — TSH: TSH: 1.49 u[IU]/mL (ref 0.450–4.500)

## 2015-04-11 LAB — RPR: RPR: NONREACTIVE

## 2015-04-11 LAB — HIV ANTIBODY (ROUTINE TESTING W REFLEX): HIV SCREEN 4TH GENERATION: NONREACTIVE

## 2015-04-11 MED ORDER — CHOLECALCIFEROL 125 MCG (5000 UT) PO TABS: ORAL_TABLET | ORAL | Status: DC

## 2015-04-11 NOTE — Telephone Encounter (Signed)
Pt aware of labs, has vitamin D def. Take 5000 IU  viatmin D 3 every day and increase exercise, like walking and decrease carbs and fats

## 2015-04-15 LAB — CYTOLOGY - PAP

## 2015-04-17 ENCOUNTER — Ambulatory Visit (HOSPITAL_COMMUNITY)
Admission: RE | Admit: 2015-04-17 | Discharge: 2015-04-17 | Disposition: A | Discharge status: Home / Self Care | Payer: Medicaid Other | Source: Ambulatory Visit | Attending: Obstetrics & Gynecology | Admitting: Obstetrics & Gynecology

## 2015-04-17 DIAGNOSIS — N63 Unspecified lump in breast: Secondary | ICD-10-CM | POA: Diagnosis present

## 2015-04-17 DIAGNOSIS — N644 Mastodynia: Secondary | ICD-10-CM | POA: Diagnosis not present

## 2015-04-17 DIAGNOSIS — N6320 Unspecified lump in the left breast, unspecified quadrant: Secondary | ICD-10-CM

## 2015-04-22 ENCOUNTER — Encounter (HOSPITAL_COMMUNITY): Payer: Medicaid Other

## 2015-05-19 ENCOUNTER — Encounter (HOSPITAL_COMMUNITY): Payer: Self-pay | Admitting: *Deleted

## 2015-05-19 ENCOUNTER — Emergency Department (HOSPITAL_COMMUNITY)
Admission: EM | Admit: 2015-05-19 | Discharge: 2015-05-19 | Disposition: A | Discharge status: Home / Self Care | Payer: Medicaid Other | Attending: Emergency Medicine | Admitting: Emergency Medicine

## 2015-05-19 ENCOUNTER — Emergency Department (HOSPITAL_COMMUNITY): Payer: Medicaid Other

## 2015-05-19 DIAGNOSIS — L539 Erythematous condition, unspecified: Secondary | ICD-10-CM | POA: Insufficient documentation

## 2015-05-19 DIAGNOSIS — R21 Rash and other nonspecific skin eruption: Secondary | ICD-10-CM | POA: Diagnosis not present

## 2015-05-19 DIAGNOSIS — Z792 Long term (current) use of antibiotics: Secondary | ICD-10-CM | POA: Diagnosis not present

## 2015-05-19 DIAGNOSIS — Z8742 Personal history of other diseases of the female genital tract: Secondary | ICD-10-CM | POA: Diagnosis not present

## 2015-05-19 DIAGNOSIS — Z8659 Personal history of other mental and behavioral disorders: Secondary | ICD-10-CM | POA: Insufficient documentation

## 2015-05-19 DIAGNOSIS — Z79899 Other long term (current) drug therapy: Secondary | ICD-10-CM | POA: Diagnosis not present

## 2015-05-19 DIAGNOSIS — Z87891 Personal history of nicotine dependence: Secondary | ICD-10-CM | POA: Diagnosis not present

## 2015-05-19 DIAGNOSIS — Z8619 Personal history of other infectious and parasitic diseases: Secondary | ICD-10-CM | POA: Diagnosis not present

## 2015-05-19 DIAGNOSIS — E559 Vitamin D deficiency, unspecified: Secondary | ICD-10-CM | POA: Insufficient documentation

## 2015-05-19 DIAGNOSIS — M545 Low back pain, unspecified: Secondary | ICD-10-CM

## 2015-05-19 DIAGNOSIS — Z79818 Long term (current) use of other agents affecting estrogen receptors and estrogen levels: Secondary | ICD-10-CM | POA: Diagnosis not present

## 2015-05-19 LAB — POC URINE PREG, ED: PREG TEST UR: NEGATIVE

## 2015-05-19 MED ORDER — METHOCARBAMOL 500 MG PO TABS
1000.0000 mg | ORAL_TABLET | Freq: Once | ORAL | Status: AC
  Administered 2015-05-19: 1000 mg via ORAL
  Filled 2015-05-19: qty 2

## 2015-05-19 MED ORDER — KETOROLAC TROMETHAMINE 60 MG/2ML IM SOLN
60.0000 mg | Freq: Once | INTRAMUSCULAR | Status: AC
  Administered 2015-05-19: 60 mg via INTRAMUSCULAR
  Filled 2015-05-19: qty 2

## 2015-05-19 MED ORDER — LIDOCAINE 5 % EX PTCH: 1.0000 | MEDICATED_PATCH | CUTANEOUS | Status: DC

## 2015-05-19 MED ORDER — NAPROXEN 500 MG PO TABS: 500.0000 mg | ORAL_TABLET | Freq: Two times a day (BID) | ORAL | Status: DC

## 2015-05-19 MED ORDER — LIDOCAINE 5 % EX PTCH
1.0000 | MEDICATED_PATCH | CUTANEOUS | Status: DC
  Administered 2015-05-19: 1 via TRANSDERMAL
  Filled 2015-05-19: qty 1

## 2015-05-19 MED ORDER — OXYCODONE-ACETAMINOPHEN 5-325 MG PO TABS
1.0000 | ORAL_TABLET | Freq: Once | ORAL | Status: AC
  Administered 2015-05-19: 1 via ORAL
  Filled 2015-05-19: qty 1

## 2015-05-19 MED ORDER — OXYCODONE-ACETAMINOPHEN 5-325 MG PO TABS: 1.0000 | ORAL_TABLET | ORAL | Status: DC | PRN

## 2015-05-19 MED ORDER — METHOCARBAMOL 500 MG PO TABS: 500.0000 mg | ORAL_TABLET | Freq: Two times a day (BID) | ORAL | Status: DC

## 2015-05-19 NOTE — ED Provider Notes (Signed)
CSN: [id number]     Arrival date & time 05/19/15  1422 History  By signing my name below, I, Linna Darner, attest that this documentation has been prepared under the direction and in the presence of non-physician practitioner, Harolyn Rutherford, PA-C. Electronically Signed: Linna Darner, Scribe. 05/19/2015. 5:55 PM.    Chief Complaint  Patient presents with  . Back Pain    The history is provided by the patient. No language interpreter was used.     HPI Comments: [name] is a 30 y.o. female with no pertinent PMHx who presents to the Emergency Department complaining of sudden onset, constant, severe, lower back pain since yesterday. Pt has taken ibuprofen and used IcyHot with no relief. She denies any recent fall or injury to her lower back. She denies experiencing similar pain in the past. Pt notes that she is unable to raise her bilateral legs due to lower back pain; she notes that her lower back pain radiates into her anterior and posterior bilateral legs. She denies dysuria, abdominal pain, abnormal vaginal discharge, changes in bowel or bladder function, or any other associated symptoms. Pt notes an injury one year ago and believes it could have affected her lower back. She then adds that her back has hurt intermittently since then.  Pt additionally complains of left toe pruritis and pain for the last couple of days. She notes that she noticed purulent drainage coming from between her 3rd and 4th left toes a couple of days ago. She denies numbness or any other associated symptoms.  Past Medical History  Diagnosis Date  . Irregular bleeding 01/17/2013  . Vaginal discharge 04/19/2013  . Yeast infection 04/19/2013  . Depression   . HSV infection   . Vaginal odor 04/10/2015  . BV (bacterial vaginosis) 04/10/2015  . Urinary frequency 04/10/2015  . Vitamin D deficiency 04/11/2015   Past Surgical History  Procedure Laterality Date  . Ercp N/A 09/04/2012    Procedure: ENDOSCOPIC RETROGRADE  CHOLANGIOPANCREATOGRAPHY (ERCP);  Surgeon: Rachael Fee, MD;  Location: [facility] OR;  Service: Endoscopy;  Laterality: N/A;  . Cholecystectomy N/A 09/05/2012    Procedure: LAPAROSCOPIC CHOLECYSTECTOMY;  Surgeon: Emelia Loron, MD;  Location: [facility];  Service: General;  Laterality: N/A;   Family History  Problem Relation Age of Onset  . CAD Maternal Grandmother   . Hypertension Mother   . Diabetes Mother   . Breast cancer Maternal Aunt    Social History  Substance Use Topics  . Smoking status: Former Smoker -- 0.25 packs/day for 10 years    Types: Cigarettes    Quit date: 08/29/2012  . Smokeless tobacco: Never Used  . Alcohol Use: No   OB History    Gravida Para Term Preterm AB TAB SAB Ectopic Multiple Living   0 4     Review of Systems  Constitutional: Negative for fever and chills.  Gastrointestinal: Negative for nausea, vomiting and abdominal pain.  Genitourinary: Negative for dysuria, vaginal discharge and difficulty urinating.  Musculoskeletal: Positive for back pain (lower).  Skin: Positive for rash (left toe).  Neurological: Negative for dizziness, syncope, weakness, light-headedness, numbness and headaches.      Allergies  Review of patient's allergies indicates no known allergies.  Home Medications   Prior to Admission medications   Medication Sig Start Date End Date Taking? Authorizing Provider  Cholecalciferol 5000 units TABS Take 5000 IU daily 04/11/15   Adline Potter, NP  HYDROcodone-acetaminophen (NORCO/VICODIN) [phone]  MG tablet Take 1 tablet by mouth every 4 (four) hours as needed. 01/21/15   Ivery Quale, PA-C  ibuprofen (ADVIL,MOTRIN) 600 MG tablet Take 1 tablet (600 mg total) by mouth every 6 (six) hours as needed. 01/21/15   Ivery Quale, PA-C  ketoconazole (NIZORAL) 2 % shampoo Apply 1 application topically 2 (two) times a week. 01/15/15   Tilda Burrow, MD  lidocaine (LIDODERM) 5 % Place 1 patch onto the skin daily. Remove & Discard  patch within 12 hours or as directed by MD 05/19/15   Anselm Pancoast, PA-C  methocarbamol (ROBAXIN) 500 MG tablet Take 1 tablet (500 mg total) by mouth 2 (two) times daily. 05/19/15   Shawn C Joy, PA-C  metroNIDAZOLE (FLAGYL) 500 MG tablet Take 1 tablet (500 mg total) by mouth 2 (two) times daily. 04/10/15   Adline Potter, NP  naproxen (NAPROSYN) 500 MG tablet Take 1 tablet (500 mg total) by mouth 2 (two) times daily. 05/19/15   Shawn C Joy, PA-C  Norethindrone-Ethinyl Estradiol-Fe Biphas (LO LOESTRIN FE) 1 MG-10 MCG / 10 MCG tablet Take 1 tablet by mouth daily. Take 1 daily by mouth 04/10/15   Adline Potter, NP  oxyCODONE-acetaminophen (PERCOCET/ROXICET) 5-325 MG tablet Take 1 tablet by mouth every 4 (four) hours as needed for severe pain. 05/19/15   Shawn C Joy, PA-C   BP 107/56 mmHg  Pulse 73  Temp(Src) 98 F (36.7 C) (Oral)  Resp 16  Wt 83.915 kg  SpO2 98%  LMP 05/05/2015 Physical Exam  Constitutional: She is oriented to person, place, and time. She appears well-developed and well-nourished. No distress.  HENT:  Head: Normocephalic and atraumatic.  Eyes: Conjunctivae are normal.  Neck: Normal range of motion.  Cardiovascular: Normal rate and regular rhythm.   Pulmonary/Chest: Effort normal.  Abdominal: Soft. There is no tenderness.  Musculoskeletal:  Tenderness to the left lumbar musculature. Full ROM in all extremities and spine. No paraspinal tenderness.   Neurological: She is alert and oriented to person, place, and time. She has normal reflexes.  No sensory deficits. Strength 5/5 in all extremities. No gait disturbance. In addition, patient was noted to freely ambulate around the ED multiple times prior to being assessed. Coordination intact.   Skin: Skin is warm and dry. She is not diaphoretic.  Erythema and skin breakdown between third and fourth toe on the left, 2cm across.  Nursing note and vitals reviewed.   ED Course  Procedures (including critical care  time)  DIAGNOSTIC STUDIES: Oxygen Saturation is 99% on RA, normal by my interpretation.    COORDINATION OF CARE: 5:56 PM Will administer 60 mg Toradol injection. Will order DG Lumbar Spine Complete and DG Thoracic Spine 2 View. Will order pregnancy test. Discussed treatment plan with pt at bedside and pt agreed to plan.   Labs Review Labs Reviewed  POC URINE PREG, ED    Imaging Review Dg Thoracic Spine 2 View  05/19/2015  CLINICAL DATA:  Intermittent pain after falling to the floor when a tree fell on her house yesterday. EXAM: THORACIC SPINE 2 VIEWS COMPARISON:  None. FINDINGS: There is no evidence of thoracic spine fracture. Alignment is normal. No other significant bone abnormalities are identified. IMPRESSION: Negative. Electronically Signed   By: Ellery Plunk M.D.   On: 05/19/2015 21:30   Dg Lumbar Spine Complete  05/19/2015  CLINICAL DATA:  Back pain. A tree fell on the patient's house yesterday and she fell to the floor. EXAM: LUMBAR SPINE -  COMPLETE 4+ VIEW COMPARISON:  None. FINDINGS: There is no evidence of lumbar spine fracture. Alignment is normal. Intervertebral disc spaces are maintained. IMPRESSION: Negative. Electronically Signed   By: Burman NievesWilliam  Stevens M.D.   On: 05/19/2015 21:31   I have personally reviewed and evaluated these images and lab results as part of my medical decision-making.   EKG Interpretation None      MDM   Final diagnoses:  Midline low back pain without sciatica    [name] presents with lower back pain since yesterday.  Patient has no neurologic or functional deficits. She has no red flag symptoms. Patient initially states that she has never hurt her back but then later admits that she had a back injury year ago and that her back has been hurting intermittently since that time. Patient improved with conservative management here in the ED. X-ray showed no abnormalities. Home care and return precautions discussed. Patient voiced  understanding of these instructions and is comfortable with discharge.  Filed Vitals:   05/19/15 1638 05/19/15 2154  BP: 117/71 107/56  Pulse: 88 73  Temp: 98 F (36.7 C)   TempSrc: Oral   Resp: 16 16  Weight: 83.915 kg   SpO2: 99% 98%    I personally performed the services described in this documentation, which was scribed in my presence. The recorded information has been reviewed and is accurate.   Anselm PancoastShawn C Joy, PA-C 05/20/15 0025  Tilden FossaElizabeth Rees, MD 05/20/15 [phone]

## 2015-05-19 NOTE — Discharge Instructions (Signed)
You have been seen today for back pain. Your imaging showed no abnormalities. Follow up with PCP as needed if symptoms continue. Return to ED should symptoms worsen.  RESOURCE GUIDE  Chronic Pain Problems: Contact [facility]  [phone] Patients need to be referred by their primary care doctor.  Insufficient Money for Medicine: Contact United Way:  call "211" or [facility] [phone].  No Primary Care Doctor: Call Health Connect  [phone] - can help you locate a primary care doctor that  accepts your insurance, provides certain services, etc. Physician Referral Service- [phone]  Agencies that provide inexpensive medical care: [facility]  [phone] [facility]  [phone] [facility]  [phone] [facility]  [phone] [facility]  [phone] [facility]  [phone]  Medicaid-accepting [facility] Providers: [facility]- [address]  [phone], Mon-Fri 9am-7pm, Sat 9am-1pm [place]- [address] [place], Suite [place]  [phone] [facility]- [address] [place]  [phone] [facility]- [address]  [phone] Renaye Rakers- [address] [place], [address], [phone]  Only accepts [place] patients after they have their name  applied to their card  Self Pay (no insurance) in [facility]: Sickle Cell Patients: Dr Willey Blade, [facility]  [address] [place], [phone] [facility]- [address] [place]  [phone]       [facility] [place]- [address]       -     [facility]- see information above (Speak to [employer] if you do not have insurance)       -  [facility]- [address] [place], [phone]       -  [facility]- [address],  [phone]       -  [facility]- [address] [phone]       -  Dr Julio Sicks-  [address], [place], [phone]       -  [facility]- [address] [phone]       -  [facility]- [address] [phone], also [address] [phone]       -    [facility]- [address] [place], [phone], 1st & 3rd Saturday   every month, 10am-1pm  1) Find a Doctor and Pay Out of Pocket Although you won't have to find out who is covered by your insurance plan, it is a good idea to ask around and get recommendations. You will then need to call the office and see if the doctor you have chosen will accept you as a new patient and what types of options they offer for patients who are self-pay. Some doctors offer discounts or will set up payment plans for their patients who do not have insurance, but you will need to ask so you aren't surprised when you get to your appointment.  2) Contact Your Local Health Department Not all health departments have doctors that can see patients for sick visits, but many do, so it is worth a call to see if yours does. If you don't know where your local health department is, you can check in your phone book. The CDC also has a tool  to help you locate your state's health department, and many state websites also have listings of all of their local health departments.  3) Find a Walk-in Clinic If your illness is not likely to be very severe or complicated, you may want to try a walk in clinic. These are popping up all over the country in pharmacies, drugstores, and shopping centers. They're usually staffed by nurse practitioners or physician assistants that have been trained to treat common illnesses and complaints. They're usually fairly quick and inexpensive. However, if you have serious medical issues or chronic medical problems, these are probably not your best option  STD Testing [facility] of [facility] [place], STD Clinic,  [address], [place], phone [phone] or [phone].  Monday - Friday, call for an appointment. [facility] of [facility] [place], STD Clinic, [place], [place], phone [phone] or [phone].  Monday - Friday, call for an appointment.  Abuse/Neglect: [facility] Child Abuse Hotline 830-528-7593(336) [phone] [facility] Child Abuse Hotline 424-710-6991(412)309-2357 (After Hours)  Emergency Shelter:  [facility] (971)131-3564(336) (636)[phone]  Maternity Homes: [facility] [place] [facility] 785-134-4235(336) [phone] [facility] [phone]  MRSA Hotline #:   (504)112-8109864-516-2243  [facility] Resources  [facility]  United Way [facility] [address]                 [address]         [address] [place] [address]  [place]                                               [place]                              [place] Phone:  [phone]                                  Phone:  [phone]                   Phone:  [phone]  [facility], [phone] [facility] - [facility]- [phone]       -     [facility] in [place], [address],                                  [phone], [facility]  [place] Child Abuse Hotline 878-437-6368(336) (985)544-0504 or (440) 058-5034(336) 2088578556 (After Hours)   Behavioral Health Services  Substance Abuse Resources: [facility]  [phone] [facility] [phone] [facility] [phone] [facility] [phone] Residential & Outpatient Substance Abuse Program  [phone]  Psychological Services: [facility]  [phone] [facility]  [phone] [facility], [phone] [place] [address], [place], ACCESS LINE: (769) [phone] or [phone], [email or web address]  Dental Assistance  If unable to pay or uninsured, contact:   [facility] or [facility] to become qualified for the adult dental clinic.  Patients with Medicaid: [facility] [id number], [phone] [address], [phone]  If unable to  pay, or uninsured, contact [facility] [phone]) or [facility] [phone] in [place], [phone] in [facility]) to become qualified for the adult dental clinic   Other Low-Cost Community Dental Services: [facility]- [address] [place], [place], [place], [address], [phone], [address]. [phone], 2nd and 4th Thursday of the month at 6:30am.  10 clients each day by appointment, can sometimes see walk-in patients if someone does not show for an appointment. [facility]- [address] Ether Griffins [place], [place], [address], [phone] [facility] [address], [place], [place], [address], [phone] [facility] Health Department- [phone] [facility] Health Department- [phone] [facility] [phone]

## 2015-05-19 NOTE — ED Notes (Signed)
Pt states she woke up yesterday with lower back pain. States she has taken ibuprofen and icy hot with no relief. Denies injury.

## 2015-05-19 NOTE — ED Notes (Signed)
Pt did not answer x 2 

## 2015-05-19 NOTE — ED Notes (Signed)
Patient called in main ED waiting area with no response 

## 2015-06-11 ENCOUNTER — Telehealth: Payer: Self-pay

## 2015-06-11 NOTE — Telephone Encounter (Signed)
Unable to contact patient regarding referral/appointment.

## 2015-09-30 ENCOUNTER — Ambulatory Visit: Payer: Medicaid Other | Admitting: Women's Health

## 2015-10-01 ENCOUNTER — Ambulatory Visit (INDEPENDENT_AMBULATORY_CARE_PROVIDER_SITE_OTHER): Payer: Medicaid Other | Admitting: Women's Health

## 2015-10-01 ENCOUNTER — Encounter: Payer: Self-pay | Admitting: Women's Health

## 2015-10-01 VITALS — BP 128/68 | HR 64 | Wt 184.0 lb

## 2015-10-01 DIAGNOSIS — N760 Acute vaginitis: Secondary | ICD-10-CM | POA: Diagnosis not present

## 2015-10-01 DIAGNOSIS — A499 Bacterial infection, unspecified: Secondary | ICD-10-CM

## 2015-10-01 DIAGNOSIS — B9689 Other specified bacterial agents as the cause of diseases classified elsewhere: Secondary | ICD-10-CM

## 2015-10-01 DIAGNOSIS — N898 Other specified noninflammatory disorders of vagina: Secondary | ICD-10-CM

## 2015-10-01 DIAGNOSIS — R300 Dysuria: Secondary | ICD-10-CM | POA: Diagnosis not present

## 2015-10-01 LAB — POCT URINALYSIS DIPSTICK
Blood, UA: NEGATIVE
GLUCOSE UA: NEGATIVE
Ketones, UA: NEGATIVE
Nitrite, UA: NEGATIVE
Protein, UA: NEGATIVE

## 2015-10-01 LAB — POCT WET PREP (WET MOUNT): CLUE CELLS WET PREP WHIFF POC: POSITIVE

## 2015-10-01 MED ORDER — METRONIDAZOLE 500 MG PO TABS: 500.0000 mg | ORAL_TABLET | Freq: Two times a day (BID) | ORAL | 0 refills | Status: DC

## 2015-10-01 NOTE — Progress Notes (Signed)
   [facility]  Patient name: [name] MRN [id number]  Date of birth: 08-10-1985  CC & HPI:  [name] is a 30 y.o. G41P4004 African American female presenting today for report of vaginal itching/irritation and yellow d/c x 5d. Also dysuria x 1wk- much worse last week, now feels much better. Denies frequency/hematuria/etc.  Patient's last menstrual period was 09/07/2015. The current method of family planning is OCP (estrogen/progesterone). Last pap Feb 2017, neg  Pertinent History Reviewed:  Medical & Surgical Hx:   Past medical, surgical, family, and social history reviewed in electronic medical record Medications: Reviewed & Updated - see associated section Allergies: Reviewed in electronic medical record  Objective Findings:  Vitals: BP 128/68 (BP Location: Right Arm, Patient Position: Sitting, Cuff Size: Large)   Pulse 64   Wt 184 lb (83.5 kg)   LMP 09/07/2015   BMI 32.08 kg/m  Body mass index is 32.08 kg/m.  Physical Examination: General appearance - alert, well appearing, and in no distress Pelvic - cx anterior, mod amt thin white malodorous d/c  Results for orders placed or performed in visit on 10/01/15 (from the past 24 hour(s))  POCT urinalysis dipstick   Collection Time: 10/01/15  3:42 PM  Result Value Ref Range   Color, UA     Clarity, UA     Glucose, UA neg    Bilirubin, UA     Ketones, UA neg    Spec Grav, UA     Blood, UA neg    pH, UA     Protein, UA neg    Urobilinogen, UA     Nitrite, UA neg    Leukocytes, UA moderate (2+) (A) Negative  POCT Wet Prep [place])   Collection Time: 10/01/15  4:20 PM  Result Value Ref Range   Source Wet Prep POC vaginal    WBC, Wet Prep HPF POC few    Bacteria Wet Prep HPF POC None None, Few, Too numerous to count   BACTERIA WET PREP MORPHOLOGY POC     Clue Cells Wet Prep HPF POC Moderate (A) None, Too numerous to count   Clue Cells Wet Prep Whiff POC Positive Whiff    Yeast Wet  Prep HPF POC None    KOH Wet Prep POC     Trichomonas Wet Prep HPF POC none      Assessment & Plan:  A:   BV  Resolving dysuria  P:  Send urine for gc/ct and urine cx  Rx metronidazole 500mg  BID x 7d for BV, no sex or etoh while taking   Return for Feb for physical.  Marge Duncans CNM, [facility] 10/01/2015 4:20 PM

## 2015-10-02 LAB — URINE CULTURE

## 2015-10-03 LAB — GC/CHLAMYDIA PROBE AMP
Chlamydia trachomatis, NAA: NEGATIVE
Neisseria gonorrhoeae by PCR: NEGATIVE
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
## 2016-05-05 ENCOUNTER — Other Ambulatory Visit: Payer: Self-pay | Admitting: Obstetrics and Gynecology

## 2016-05-05 ENCOUNTER — Other Ambulatory Visit: Payer: Self-pay | Admitting: Advanced Practice Midwife

## 2016-07-18 ENCOUNTER — Other Ambulatory Visit: Payer: Self-pay | Admitting: Obstetrics and Gynecology

## 2016-08-19 ENCOUNTER — Encounter: Payer: Self-pay | Admitting: Adult Health

## 2016-08-19 ENCOUNTER — Ambulatory Visit (INDEPENDENT_AMBULATORY_CARE_PROVIDER_SITE_OTHER): Payer: Medicaid Other | Admitting: Adult Health

## 2016-08-19 VITALS — BP 110/68 | HR 84 | Ht 64.5 in | Wt 207.0 lb

## 2016-08-19 DIAGNOSIS — N898 Other specified noninflammatory disorders of vagina: Secondary | ICD-10-CM

## 2016-08-19 DIAGNOSIS — Z3201 Encounter for pregnancy test, result positive: Secondary | ICD-10-CM

## 2016-08-19 DIAGNOSIS — O3680X0 Pregnancy with inconclusive fetal viability, not applicable or unspecified: Secondary | ICD-10-CM

## 2016-08-19 DIAGNOSIS — N926 Irregular menstruation, unspecified: Secondary | ICD-10-CM | POA: Diagnosis not present

## 2016-08-19 DIAGNOSIS — Z3490 Encounter for supervision of normal pregnancy, unspecified, unspecified trimester: Secondary | ICD-10-CM | POA: Insufficient documentation

## 2016-08-19 LAB — POCT URINE PREGNANCY: Preg Test, Ur: POSITIVE — AB

## 2016-08-19 LAB — POCT WET PREP (WET MOUNT)
CLUE CELLS WET PREP WHIFF POC: NEGATIVE
WBC, Wet Prep HPF POC: POSITIVE

## 2016-08-19 MED ORDER — PRENATAL PLUS 27-1 MG PO TABS: 1.0000 | ORAL_TABLET | Freq: Every day | ORAL | 12 refills | Status: DC

## 2016-08-19 NOTE — Progress Notes (Signed)
Subjective:     Patient ID: Sonia BrinkShavonne J Montgomery, female   DOB: 10-07-1985, 31 y.o.   MRN: [id number]  HPI [name] is a 31 year old black female in complaining of vaginal discharge, (no itching or odor) and wants to discuss birth control, has missed a period, and was surprised to have +UPT in office.   Review of Systems Vaginal discharge, no itching or odor +missed period Reviewed past medical,surgical, social and family history. Reviewed medications and allergies.     Objective:   Physical Exam BP 110/68 (BP Location: Right Arm, Patient Position: Sitting, Cuff Size: Normal)   Pulse 84   Ht 5' 4.5" (1.638 m)   Wt 207 lb (93.9 kg)   LMP 07/15/2016 (Approximate)   BMI 34.98 kg/m UPT +, about 5 weeks by LMP with EDD 04/22/17.  Skin warm and dry. Neck: mid line trachea, normal thyroid, good ROM, no lymphadenopathy noted. Lungs: clear to ausculation bilaterally. Cardiovascular: regular rate and rhythm. Pelvic: external genitalia is normal in appearance no lesions, vagina: white discharge without odor,urethra has no lesions or masses noted, cervix:smooth and bulbous, uterus: normal size, shape and contour, non tender, no masses felt, adnexa: no masses or tenderness noted. Bladder is non tender and no masses felt. Wet prep:  +WBCs. Pt not sure if she wants to keep pregnancy, will talk with FOB, will get dating US in 2 weeks.If she chooses termination will give numbers to clinics in [place], and if she chooses to continue pregnancy will make new OB appt.     Assessment:     1. Vaginal discharge   2. Missed period   3. Positive pregnancy test   4. Pregnancy, unspecified gestational age   665. Encounter to determine fetal viability of pregnancy, single or unspecified fetus   6. Unexpected pregnancy       Plan:     Meds ordered this encounter  Medications  . prenatal vitamin w/FE, FA (PRENATAL 1 + 1) 27-1 MG TABS tablet    Sig: Take 1 tablet by mouth daily at 12 noon.    Dispense:  30  each    Refill:  12    Order Specific Question:   Supervising Provider    Answer:   Duane LopeEURE, LUTHER H [[id number]]  Follow up in 2 weeks for dating US Review handout by [facility]

## 2016-09-02 ENCOUNTER — Other Ambulatory Visit: Payer: Self-pay | Admitting: Adult Health

## 2016-09-02 ENCOUNTER — Ambulatory Visit (INDEPENDENT_AMBULATORY_CARE_PROVIDER_SITE_OTHER): Payer: Medicaid Other

## 2016-09-02 DIAGNOSIS — O3680X0 Pregnancy with inconclusive fetal viability, not applicable or unspecified: Secondary | ICD-10-CM

## 2016-09-02 NOTE — Progress Notes (Signed)
US 5+5 wks,single IUP w/ys,pos fht 157 bpm,normal ovaries bilat,subchorionic hemorrhage 3.9 x 3.2 x .4 cm,CRL 2.2 mm,EDD 04/30/2017

## 2016-09-20 ENCOUNTER — Encounter (HOSPITAL_COMMUNITY): Payer: Self-pay

## 2016-09-20 ENCOUNTER — Emergency Department (HOSPITAL_COMMUNITY)
Admission: EM | Admit: 2016-09-20 | Discharge: 2016-09-20 | Disposition: A | Discharge status: Home / Self Care | Payer: Medicaid Other | Attending: Emergency Medicine | Admitting: Emergency Medicine

## 2016-09-20 DIAGNOSIS — Y92410 Unspecified street and highway as the place of occurrence of the external cause: Secondary | ICD-10-CM | POA: Insufficient documentation

## 2016-09-20 DIAGNOSIS — R1084 Generalized abdominal pain: Secondary | ICD-10-CM | POA: Diagnosis not present

## 2016-09-20 DIAGNOSIS — Z3A09 9 weeks gestation of pregnancy: Secondary | ICD-10-CM | POA: Insufficient documentation

## 2016-09-20 DIAGNOSIS — O9989 Other specified diseases and conditions complicating pregnancy, childbirth and the puerperium: Secondary | ICD-10-CM | POA: Diagnosis present

## 2016-09-20 DIAGNOSIS — T148XXA Other injury of unspecified body region, initial encounter: Secondary | ICD-10-CM

## 2016-09-20 DIAGNOSIS — Y939 Activity, unspecified: Secondary | ICD-10-CM | POA: Insufficient documentation

## 2016-09-20 DIAGNOSIS — O99351 Diseases of the nervous system complicating pregnancy, first trimester: Secondary | ICD-10-CM | POA: Diagnosis not present

## 2016-09-20 DIAGNOSIS — G44209 Tension-type headache, unspecified, not intractable: Secondary | ICD-10-CM | POA: Diagnosis not present

## 2016-09-20 DIAGNOSIS — Y999 Unspecified external cause status: Secondary | ICD-10-CM | POA: Diagnosis not present

## 2016-09-20 DIAGNOSIS — Z87891 Personal history of nicotine dependence: Secondary | ICD-10-CM | POA: Insufficient documentation

## 2016-09-20 LAB — URINALYSIS, ROUTINE W REFLEX MICROSCOPIC
Bilirubin Urine: NEGATIVE
Glucose, UA: NEGATIVE mg/dL
Hgb urine dipstick: NEGATIVE
Ketones, ur: NEGATIVE mg/dL
Nitrite: NEGATIVE
PH: 7 (ref 5.0–8.0)
Protein, ur: NEGATIVE mg/dL
SPECIFIC GRAVITY, URINE: 1.004 — AB (ref 1.005–1.030)

## 2016-09-20 LAB — CBC WITH DIFFERENTIAL/PLATELET
Basophils Absolute: 0 10*3/uL (ref 0.0–0.1)
Basophils Relative: 0 %
EOS PCT: 2 %
Eosinophils Absolute: 0.2 10*3/uL (ref 0.0–0.7)
HCT: 35.8 % — ABNORMAL LOW (ref 36.0–46.0)
Hemoglobin: 12 g/dL (ref 12.0–15.0)
LYMPHS PCT: 23 %
Lymphs Abs: 2.7 10*3/uL (ref 0.7–4.0)
MCH: 30.2 pg (ref 26.0–34.0)
MCHC: 33.5 g/dL (ref 30.0–36.0)
MCV: 89.9 fL (ref 78.0–100.0)
MONO ABS: 0.9 10*3/uL (ref 0.1–1.0)
MONOS PCT: 7 %
Neutro Abs: 8 10*3/uL — ABNORMAL HIGH (ref 1.7–7.7)
Neutrophils Relative %: 68 %
Platelets: 285 10*3/uL (ref 150–400)
RBC: 3.98 MIL/uL (ref 3.87–5.11)
RDW: 13.4 % (ref 11.5–15.5)
WBC: 11.7 10*3/uL — ABNORMAL HIGH (ref 4.0–10.5)

## 2016-09-20 LAB — COMPREHENSIVE METABOLIC PANEL
ALBUMIN: 3.5 g/dL (ref 3.5–5.0)
ALK PHOS: 60 U/L (ref 38–126)
ALT: 21 U/L (ref 14–54)
ANION GAP: 7 (ref 5–15)
AST: 16 U/L (ref 15–41)
BUN: 7 mg/dL (ref 6–20)
CALCIUM: 8.7 mg/dL — AB (ref 8.9–10.3)
CO2: 25 mmol/L (ref 22–32)
Chloride: 102 mmol/L (ref 101–111)
Creatinine, Ser: 0.6 mg/dL (ref 0.44–1.00)
GFR calc Af Amer: >60 mL/min (ref >60)
GFR calc non Af Amer: >60 mL/min (ref >60)
GLUCOSE: 99 mg/dL (ref 65–99)
Potassium: 3.8 mmol/L (ref 3.5–5.1)
SODIUM: 134 mmol/L — AB (ref 135–145)
Total Bilirubin: 0.7 mg/dL (ref 0.3–1.2)
Total Protein: 6.7 g/dL (ref 6.5–8.1)

## 2016-09-20 MED ORDER — ACETAMINOPHEN 500 MG PO TABS
1000.0000 mg | ORAL_TABLET | Freq: Once | ORAL | Status: AC
  Administered 2016-09-20: 1000 mg via ORAL
  Filled 2016-09-20: qty 2
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
## 2016-09-20 NOTE — ED Notes (Signed)
Unable to doppler fetal heart tones. MD Long notified and states he will attempt to assess FHT.
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
## 2016-09-21 ENCOUNTER — Ambulatory Visit (INDEPENDENT_AMBULATORY_CARE_PROVIDER_SITE_OTHER): Payer: Medicaid Other | Admitting: Women's Health

## 2016-09-21 ENCOUNTER — Encounter: Payer: Self-pay | Admitting: Women's Health

## 2016-09-21 ENCOUNTER — Ambulatory Visit: Payer: Medicaid Other | Admitting: *Deleted

## 2016-09-21 ENCOUNTER — Telehealth: Payer: Self-pay | Admitting: Women's Health

## 2016-09-21 VITALS — BP 92/56 | HR 88 | Wt 210.0 lb

## 2016-09-21 DIAGNOSIS — R768 Other specified abnormal immunological findings in serum: Secondary | ICD-10-CM | POA: Diagnosis not present

## 2016-09-21 DIAGNOSIS — O288 Other abnormal findings on antenatal screening of mother: Secondary | ICD-10-CM

## 2016-09-21 DIAGNOSIS — Z3A08 8 weeks gestation of pregnancy: Secondary | ICD-10-CM | POA: Diagnosis not present

## 2016-09-21 DIAGNOSIS — Z331 Pregnant state, incidental: Secondary | ICD-10-CM | POA: Diagnosis not present

## 2016-09-21 DIAGNOSIS — Z3682 Encounter for antenatal screening for nuchal translucency: Secondary | ICD-10-CM

## 2016-09-21 DIAGNOSIS — Z1389 Encounter for screening for other disorder: Secondary | ICD-10-CM

## 2016-09-21 DIAGNOSIS — Z3481 Encounter for supervision of other normal pregnancy, first trimester: Secondary | ICD-10-CM

## 2016-09-21 DIAGNOSIS — O0990 Supervision of high risk pregnancy, unspecified, unspecified trimester: Secondary | ICD-10-CM | POA: Insufficient documentation

## 2016-09-21 LAB — POCT URINALYSIS DIPSTICK
Blood, UA: NEGATIVE
Glucose, UA: NEGATIVE
KETONES UA: NEGATIVE
LEUKOCYTES UA: NEGATIVE
Nitrite, UA: NEGATIVE

## 2016-09-21 NOTE — Telephone Encounter (Signed)
Patient called stating that she was here earlier today and see kim and she forgot to ask her a very important question. Please contact pt

## 2016-09-21 NOTE — Patient Instructions (Signed)

## 2016-09-21 NOTE — Progress Notes (Signed)
Subjective:  [name] is a 31 y.o. [id number] African American female at 3376w3d by 5wk u/s, being seen today for her first obstetrical visit.  Her obstetrical history is significant for term uncomplicated svb x 4.  Pregnancy history fully reviewed.  Patient reports headaches, upper abd cramping. States she eats a lot of fried foods, has normal bm's. Is planning on cutting out fried foods to see if cramping resolves. Denies vb, cramping, uti s/s, abnormal/malodorous vag d/c, or vulvovaginal itching/irritation.  BP (!) 92/56   Pulse 88   Wt 210 lb (95.3 kg)   LMP 07/15/2016 (Approximate)   BMI 33.89 kg/m   HISTORY: OB History  Gravida Para Term Preterm AB Living  5 4 4     4   SAB TAB Ectopic Multiple Live Births        0 4    # Outcome Date GA Lbr Len/2nd Weight Sex Delivery Anes PTL Lv  5 Current           4 Term 01/16/14 4443w5d 05:55 / 00:08 7 lb 5 oz (3.317 kg) F Vag-Spont None N LIV     Birth Comments: Neg Hep B, HIV, RPR, +GBS Observed for 48 hrs b/o GBS Hx of maternal THC use, but neg urine and meconiium drug screen on baby No jaundice, no feeding or respiratory problems  3 Term 11/26/09 853w0d  8 lb (3.629 kg) M Vag-Spont None N LIV  2 Term 09/09/05 2565w0d  8 lb (3.629 kg) F Vag-Spont None N LIV  1 Term 04/08/04 2177w0d  8 lb (3.629 kg) M Vag-Spont None N LIV     Past Medical History:  Diagnosis Date  . BV (bacterial vaginosis) 04/10/2015  . Depression   . HSV infection   . Irregular bleeding 01/17/2013  . Urinary frequency 04/10/2015  . Vaginal discharge 04/19/2013  . Vaginal odor 04/10/2015  . Vitamin D deficiency 04/11/2015  . Yeast infection 04/19/2013   Past Surgical History:  Procedure Laterality Date  . CHOLECYSTECTOMY N/A 09/05/2012   Procedure: LAPAROSCOPIC CHOLECYSTECTOMY;  Surgeon: Emelia LoronMatthew Wakefield, MD;  [facility];  Service: General;  Laterality: N/A;  . ERCP N/A 09/04/2012   Procedure: ENDOSCOPIC RETROGRADE CHOLANGIOPANCREATOGRAPHY (ERCP);  Surgeon: Rachael Feeaniel  P Jacobs, MD;  [facility];  Service: Endoscopy;  Laterality: N/A;  . WISDOM TOOTH EXTRACTION     Family History  Problem Relation Age of Onset  . CAD Maternal Grandmother   . Diabetes Maternal Grandmother   . Hypertension Mother   . Diabetes Mother   . Breast cancer Maternal Aunt   . Hypertension Maternal Grandfather   . Stroke Maternal Grandfather     Exam   System:     General: Well developed & nourished, no acute distress   Skin: Warm & dry, normal coloration and turgor, no rashes   Neurologic: Alert & oriented, normal mood   Cardiovascular: Regular rate & rhythm   Respiratory: Effort & rate normal, LCTAB, acyanotic   Abdomen: Soft, non tender   Extremities: normal strength, tone  Thin prep pap smear neg 04/10/15  FHR: + via informal transabdominal u/s   Assessment:   Pregnancy: [id number] Patient Active Problem List   Diagnosis Date Noted  . Marijuana use 06/20/2013    Priority: High  . S/P cholecystectomy 06/19/2013    Priority: High  . 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 Supervision of normal pregnancy 09/21/2016  . Unexpected pregnancy 08/19/2016  . Missed period 08/19/2016  . Vitamin  D deficiency 04/11/2015  . Hypokalemia 09/02/2012    [redacted]w[redacted]d G5P4004 New OB visit Headaches Upper abd cramping HSV2+  Plan:  Initial labs obtained Continue prenatal vitamins Problem list reviewed and updated Reviewed n/v relief measures and warning s/s to report Reviewed recommended weight gain based on pre-gravid BMI Encouraged well-balanced diet Genetic Screening discussed Integrated Screen: requested Cystic fibrosis screening discussed neg prev preg Ultrasound discussed; fetal survey: requested Follow up in 4 weeks for visit and 1st IT/NT CCNC completed Gave printed info on headaches  Marge Duncans CNM, [facility] 09/21/2016 11:53 AM

## 2016-09-21 NOTE — Telephone Encounter (Signed)
Informed patient she would need to be evaluated. Verbalized understanding. Will make appointment.

## 2016-09-21 NOTE — Telephone Encounter (Signed)
Patient called stating she forgot to mention to you today that she has a yellowish discharge with odor. Please advise.

## 2016-09-22 LAB — URINALYSIS, ROUTINE W REFLEX MICROSCOPIC
Bilirubin, UA: NEGATIVE
GLUCOSE, UA: NEGATIVE
LEUKOCYTES UA: NEGATIVE
Nitrite, UA: NEGATIVE
Protein, UA: NEGATIVE
RBC, UA: NEGATIVE
Specific Gravity, UA: >=1.03 — AB (ref 1.005–1.030)
Urobilinogen, Ur: 1 mg/dL (ref 0.2–1.0)
pH, UA: 5.5 (ref 5.0–7.5)

## 2016-09-22 LAB — CBC
Hematocrit: 37.9 % (ref 34.0–46.6)
Hemoglobin: 12.2 g/dL (ref 11.1–15.9)
MCH: 29.6 pg (ref 26.6–33.0)
MCHC: 32.2 g/dL (ref 31.5–35.7)
MCV: 92 fL (ref 79–97)
PLATELETS: 309 10*3/uL (ref 150–379)
RBC: 4.12 x10E6/uL (ref 3.77–5.28)
RDW: 14.7 % (ref 12.3–15.4)
WBC: 10.2 10*3/uL (ref 3.4–10.8)

## 2016-09-22 LAB — ABO/RH: Rh Factor: POSITIVE

## 2016-09-22 LAB — MED LIST OPTION NOT SELECTED

## 2016-09-22 LAB — RPR: RPR Ser Ql: NONREACTIVE

## 2016-09-22 LAB — HIV ANTIBODY (ROUTINE TESTING W REFLEX): HIV Screen 4th Generation wRfx: NONREACTIVE

## 2016-09-22 LAB — RUBELLA SCREEN: Rubella Antibodies, IGG: 2.51 index (ref >0.99)

## 2016-09-22 LAB — ANTIBODY SCREEN: ANTIBODY SCREEN: NEGATIVE

## 2016-09-22 LAB — VARICELLA ZOSTER ANTIBODY, IGG: Varicella zoster IgG: 550 index (ref >165)

## 2016-09-22 LAB — HEPATITIS B SURFACE ANTIGEN: HEP B S AG: NEGATIVE

## 2016-09-23 LAB — URINE CULTURE: Organism ID, Bacteria: NO GROWTH

## 2016-09-23 LAB — GC/CHLAMYDIA PROBE AMP
CHLAMYDIA, DNA PROBE: NEGATIVE
Neisseria gonorrhoeae by PCR: NEGATIVE

## 2016-09-24 ENCOUNTER — Encounter: Payer: Self-pay | Admitting: Obstetrics and Gynecology

## 2016-09-24 ENCOUNTER — Ambulatory Visit (INDEPENDENT_AMBULATORY_CARE_PROVIDER_SITE_OTHER): Payer: Medicaid Other | Admitting: Obstetrics and Gynecology

## 2016-09-24 VITALS — BP 102/64 | HR 84 | Wt 210.0 lb

## 2016-09-24 DIAGNOSIS — Z3481 Encounter for supervision of other normal pregnancy, first trimester: Secondary | ICD-10-CM | POA: Diagnosis not present

## 2016-09-24 DIAGNOSIS — Z331 Pregnant state, incidental: Secondary | ICD-10-CM | POA: Diagnosis not present

## 2016-09-24 DIAGNOSIS — Z3A08 8 weeks gestation of pregnancy: Secondary | ICD-10-CM | POA: Diagnosis not present

## 2016-09-24 DIAGNOSIS — Z1389 Encounter for screening for other disorder: Secondary | ICD-10-CM

## 2016-09-24 LAB — PMP SCREEN PROFILE (10S), URINE
Amphetamine Scrn, Ur: NEGATIVE ng/mL
BARBITURATE SCREEN URINE: NEGATIVE ng/mL
BENZODIAZEPINE SCREEN, URINE: NEGATIVE ng/mL
CANNABINOIDS UR QL SCN: NEGATIVE ng/mL
COCAINE(METAB.)SCREEN, URINE: NEGATIVE ng/mL
CREATININE(CRT), U: 305 mg/dL — AB (ref 20.0–300.0)
METHADONE SCREEN, URINE: NEGATIVE ng/mL
OXYCODONE+OXYMORPHONE UR QL SCN: NEGATIVE ng/mL
Opiate Scrn, Ur: NEGATIVE ng/mL
PROPOXYPHENE SCREEN URINE: NEGATIVE ng/mL
Ph of Urine: 6.2 (ref 4.5–8.9)
Phencyclidine Qn, Ur: NEGATIVE ng/mL

## 2016-09-24 LAB — POCT URINALYSIS DIPSTICK
Blood, UA: NEGATIVE
GLUCOSE UA: NEGATIVE
KETONES UA: NEGATIVE
Leukocytes, UA: NEGATIVE
NITRITE UA: NEGATIVE
Protein, UA: NEGATIVE

## 2016-09-24 NOTE — Progress Notes (Signed)
Patient ID: [name], female   DOB: Jul 26, 1985, 31 y.o.   MRN: [id number] 6165w6d Estimated Date of Delivery: 04/30/17 LROB  Patient reports good fetal movement, denies any bleeding and no rupture of membranes symptoms or regular contractions. Patient complaints: vaginal odor. Pt notes increased discharge and odor. She notes she has similar symptoms in the past. Denies vaginal bleeding and any other symptoms.  Last menstrual period 07/15/2016. refer to the ob flow sheet for FH and FHR, also BP, Wt, Urine results:notable for negative                          Physical Examination:  General appearance - alert, well appearing, and in no distress  Physical Examination: Pelvic - VULVA: normal appearing vulva with no masses, tenderness or lesions,  VAGINA: normal appearing vagina with normal color and discharge, no lesions,  CERVIX: normal appearing cervix without discharge or lesions, WET MOUNT done - results: KOH done and negative for yeast, rare clue cells, normal epithelial cells, no trich, negative Whiff test.  UTERUS: uterus is normal size, shape, consistency and nontender,  ADNEXA: normal adnexa in size, nontender and no masses KOH negative for used, negative whiff test Wet prep negative for Trichomonas, rare clue cell perhaps noted, rare white cells                                            Questions were answered. Assessment: LROB G5P4004 @ 6265w6d                           Normal secretions no evidence of DVT Plan:  Continued routine obstetrical care,  F/u in 4 weeks for routine LROB  By signing my name below, I, Soijett Blue, attest that this documentation has been prepared under the direction and in the presence of Tilda BurrowFerguson, Phylisha Dix V, MD. Electronically Signed: Soijett Blue, ED Scribe. 09/24/16. 1:10 PM.  I personally performed the services described in this documentation, which was SCRIBED in my presence. The recorded information has been reviewed and considered accurate.  It has been edited as necessary during review. Tilda BurrowFERGUSON,Sonia Yearwood V, MD

## 2016-10-19 ENCOUNTER — Ambulatory Visit (INDEPENDENT_AMBULATORY_CARE_PROVIDER_SITE_OTHER): Payer: Medicaid Other

## 2016-10-19 ENCOUNTER — Ambulatory Visit (INDEPENDENT_AMBULATORY_CARE_PROVIDER_SITE_OTHER): Payer: Medicaid Other | Admitting: Obstetrics & Gynecology

## 2016-10-19 ENCOUNTER — Encounter: Payer: Self-pay | Admitting: Obstetrics & Gynecology

## 2016-10-19 VITALS — BP 112/70 | HR 80 | Wt 214.0 lb

## 2016-10-19 DIAGNOSIS — Z331 Pregnant state, incidental: Secondary | ICD-10-CM | POA: Diagnosis not present

## 2016-10-19 DIAGNOSIS — Z3A12 12 weeks gestation of pregnancy: Secondary | ICD-10-CM

## 2016-10-19 DIAGNOSIS — Z1389 Encounter for screening for other disorder: Secondary | ICD-10-CM | POA: Diagnosis not present

## 2016-10-19 DIAGNOSIS — Z3682 Encounter for antenatal screening for nuchal translucency: Secondary | ICD-10-CM

## 2016-10-19 DIAGNOSIS — Z3481 Encounter for supervision of other normal pregnancy, first trimester: Secondary | ICD-10-CM | POA: Diagnosis not present

## 2016-10-19 DIAGNOSIS — Z3401 Encounter for supervision of normal first pregnancy, first trimester: Secondary | ICD-10-CM

## 2016-10-19 LAB — POCT URINALYSIS DIPSTICK
Blood, UA: NEGATIVE
GLUCOSE UA: NEGATIVE
KETONES UA: NEGATIVE
Nitrite, UA: NEGATIVE
PROTEIN UA: NEGATIVE

## 2016-10-19 NOTE — Progress Notes (Signed)
[place] 12+3 wks,crl 67.7 mm,NB present,NT 2.2 mm,normal ovaries bilat,subchorionic hemorrhage 5.1 x 1.4 x 1.9 cm,ant pl gr 0,fhr 153 bpm,

## 2016-10-19 NOTE — Progress Notes (Signed)
[id number] [redacted]w[redacted]d Estimated Date of Delivery: 04/30/17  Blood pressure 112/70, pulse 80, weight 214 lb (97.1 kg), last menstrual period 07/15/2016.   BP weight and urine results all reviewed and noted.  Please refer to the obstetrical flow sheet for the fundal height and fetal heart rate documentation:  Patient reports good fetal movement, denies any bleeding and no rupture of membranes symptoms or regular contractions. Patient is without complaints. All questions were answered.  Orders Placed This Encounter  Procedures  . Maternal Screen, Integrated #1  . POCT urinalysis dipstick    Plan:  Continued routine obstetrical care, NT done, sub chorionic hemorrhage noted, IT drawn  Return in about 4 weeks (around 11/16/2016) for LROB.

## 2016-10-25 LAB — MATERNAL SCREEN, INTEGRATED #1
Maternal Age at EDD: 32.4 yr
PAPP-A Value: 959 ng/mL

## 2016-11-05 ENCOUNTER — Telehealth: Payer: Self-pay | Admitting: *Deleted

## 2016-11-05 LAB — INTEGRATED 1
CROWN RUMP LENGTH MAT SCREEN: 67.7 mm
GEST. AGE ON COLLECTION DATE: 12.9 wk
Maternal Age at EDD: 32.2 yr
NUCHAL TRANSLUCENCY (NT): 2.2 mm
NUMBER OF FETUSES: 1
PAPP-A Value: 959 ng/mL
WEIGHT: 214 [lb_av]

## 2016-11-05 NOTE — Telephone Encounter (Signed)
Called [facility] with missing information.

## 2016-11-19 ENCOUNTER — Encounter: Payer: Self-pay | Admitting: Obstetrics & Gynecology

## 2016-11-19 ENCOUNTER — Ambulatory Visit (INDEPENDENT_AMBULATORY_CARE_PROVIDER_SITE_OTHER): Payer: Medicaid Other | Admitting: Obstetrics & Gynecology

## 2016-11-19 VITALS — BP 100/60 | HR 72 | Wt 216.0 lb

## 2016-11-19 DIAGNOSIS — Z3A16 16 weeks gestation of pregnancy: Secondary | ICD-10-CM

## 2016-11-19 DIAGNOSIS — Z331 Pregnant state, incidental: Secondary | ICD-10-CM | POA: Diagnosis not present

## 2016-11-19 DIAGNOSIS — Z3492 Encounter for supervision of normal pregnancy, unspecified, second trimester: Secondary | ICD-10-CM | POA: Diagnosis not present

## 2016-11-19 DIAGNOSIS — Z1389 Encounter for screening for other disorder: Secondary | ICD-10-CM

## 2016-11-19 DIAGNOSIS — Z1379 Encounter for other screening for genetic and chromosomal anomalies: Secondary | ICD-10-CM

## 2016-11-19 LAB — POCT URINALYSIS DIPSTICK
Blood, UA: NEGATIVE
GLUCOSE UA: NEGATIVE
KETONES UA: NEGATIVE
LEUKOCYTES UA: NEGATIVE
Nitrite, UA: NEGATIVE

## 2016-11-19 NOTE — Progress Notes (Signed)
[id number] [redacted]w[redacted]d Estimated Date of Delivery: 04/30/17  Blood pressure 100/60, pulse 72, weight 216 lb (98 kg), last menstrual period 07/15/2016.   BP weight and urine results all reviewed and noted.  Please refer to the obstetrical flow sheet for the fundal height and fetal heart rate documentation:  Patient reports good fetal movement, denies any bleeding and no rupture of membranes symptoms or regular contractions. Patient is without complaints. All questions were answered.  Orders Placed This Encounter  Procedures  . US OB Comp + 14 Wk  . INTEGRATED 2  . POCT urinalysis dipstick    Plan:  Continued routine obstetrical care, sonogram next visit 2nd IT today  Pt denies any bleeding  Return in about 3 weeks (around 12/10/2016) for 20 week sono, LROB.

## 2016-11-23 LAB — INTEGRATED 2
ADSF: 1.22
AFP MoM: 0.64
Alpha-Fetoprotein: 19.3 ng/mL
CROWN RUMP LENGTH: 67.7 mm
DIA MOM: 0.72
DIA VALUE: 96.9 pg/mL
Estriol, Unconjugated: 1.11 ng/mL
GEST. AGE ON COLLECTION DATE: 12.9 wk
Gestational Age: 17.3 weeks
HCG MOM: 1.29
HCG VALUE: 26.6 [IU]/mL
MATERNAL AGE AT EDD: 32.2 a
NUCHAL TRANSLUCENCY (NT): 2.2 mm
NUCHAL TRANSLUCENCY MOM: 1.31
NUMBER OF FETUSES: 1
PAPP-A MOM: 1.39
PAPP-A Value: 959 ng/mL
Test Results:: NEGATIVE
Weight: 214 [lb_av]
Weight: 216 [lb_av]

## 2016-12-09 ENCOUNTER — Other Ambulatory Visit: Payer: Self-pay | Admitting: Obstetrics & Gynecology

## 2016-12-09 DIAGNOSIS — Z363 Encounter for antenatal screening for malformations: Secondary | ICD-10-CM

## 2016-12-10 ENCOUNTER — Encounter: Payer: Self-pay | Admitting: Obstetrics and Gynecology

## 2016-12-10 ENCOUNTER — Ambulatory Visit (INDEPENDENT_AMBULATORY_CARE_PROVIDER_SITE_OTHER): Payer: Medicaid Other

## 2016-12-10 ENCOUNTER — Ambulatory Visit (INDEPENDENT_AMBULATORY_CARE_PROVIDER_SITE_OTHER): Payer: Medicaid Other | Admitting: Obstetrics and Gynecology

## 2016-12-10 VITALS — BP 128/70 | HR 78 | Wt 219.4 lb

## 2016-12-10 DIAGNOSIS — Z1389 Encounter for screening for other disorder: Secondary | ICD-10-CM

## 2016-12-10 DIAGNOSIS — Z3A19 19 weeks gestation of pregnancy: Secondary | ICD-10-CM

## 2016-12-10 DIAGNOSIS — Z3402 Encounter for supervision of normal first pregnancy, second trimester: Secondary | ICD-10-CM

## 2016-12-10 DIAGNOSIS — Z363 Encounter for antenatal screening for malformations: Secondary | ICD-10-CM

## 2016-12-10 DIAGNOSIS — Z331 Pregnant state, incidental: Secondary | ICD-10-CM | POA: Diagnosis not present

## 2016-12-10 DIAGNOSIS — Z3482 Encounter for supervision of other normal pregnancy, second trimester: Secondary | ICD-10-CM | POA: Diagnosis not present

## 2016-12-10 LAB — POCT URINALYSIS DIPSTICK
Glucose, UA: NEGATIVE
Ketones, UA: NEGATIVE
LEUKOCYTES UA: NEGATIVE
NITRITE UA: NEGATIVE
PROTEIN UA: NEGATIVE
RBC UA: NEGATIVE

## 2016-12-10 NOTE — Progress Notes (Signed)
[place] 19+6 wks,cephalic ant pl gr 0,cx 4.5 cm,normal ovaries bialt,svp of fluid 5.2 cm,fhr 154 bpm,RVEICF 2.5 mm,efw 313 g,limited view of spine because of fetal position,please have pt come back for additional images

## 2016-12-10 NOTE — Progress Notes (Signed)
[name] is a 31 y.o. female [id number]  Estimated Date of Delivery: 04/30/17 LROB [redacted]w[redacted]d  Chief Complaint  Patient presents with  . Routine Prenatal Visit    [place] today  ____  Patient has no complaints. Pt states she does not want any more children. She is thinking of using Implanon for birth control, since she has used it before. The first year that she was on it she had some light spotting. DISCUSSED IUD IN DETAIL AS ANOTHER LARC  OPTION.  Patient reports good fetal movement,                          denies any bleeding , rupture of membranes,or regular contractions.  Blood pressure 128/70, pulse 78, weight 219 lb 6.4 oz (99.5 kg), last menstrual period 07/15/2016.   Urine results:negative  refer to the ob flow sheet for FH and FHR, ,                          Physical Examination: General appearance - alert, well appearing, and in no distress                                      Abdomen - FH 19 ,                                                         -FHR 154                                                         soft, nontender, nondistended, no masses or organomegaly                                      Pelvic - Not indicated                                            Questions were answered. Assessment: LROB [id number] @ [redacted]w[redacted]d Estimated Date of Delivery: 04/30/17  Plan:  Continued routine obstetrical care, LROB  F/u in 4 weeks for LROB   By signing my name below, I, Izna Ahmed, attest that this documentation has been prepared under the direction and in the presence of Tilda Burrow, MD. Electronically Signed: Redge Gainer, Medical Scribe. 12/10/16. 1:37 PM.  I personally performed the services described in this documentation, which was SCRIBED in my presence. The recorded information has been reviewed and considered accurate. It has been edited as necessary during review. Tilda Burrow, MD

## 2016-12-20 ENCOUNTER — Encounter: Payer: Self-pay | Admitting: Obstetrics & Gynecology

## 2016-12-20 ENCOUNTER — Ambulatory Visit (INDEPENDENT_AMBULATORY_CARE_PROVIDER_SITE_OTHER): Payer: Medicaid Other | Admitting: Obstetrics & Gynecology

## 2016-12-20 VITALS — BP 118/66 | HR 84 | Wt 223.0 lb

## 2016-12-20 DIAGNOSIS — B3731 Acute candidiasis of vulva and vagina: Secondary | ICD-10-CM

## 2016-12-20 DIAGNOSIS — B373 Candidiasis of vulva and vagina: Secondary | ICD-10-CM | POA: Diagnosis not present

## 2016-12-20 DIAGNOSIS — Z3A21 21 weeks gestation of pregnancy: Secondary | ICD-10-CM | POA: Diagnosis not present

## 2016-12-20 DIAGNOSIS — Z1389 Encounter for screening for other disorder: Secondary | ICD-10-CM | POA: Diagnosis not present

## 2016-12-20 DIAGNOSIS — Z331 Pregnant state, incidental: Secondary | ICD-10-CM

## 2016-12-20 DIAGNOSIS — O09892 Supervision of other high risk pregnancies, second trimester: Secondary | ICD-10-CM

## 2016-12-20 LAB — POCT URINALYSIS DIPSTICK
Glucose, UA: NEGATIVE
Ketones, UA: NEGATIVE
Nitrite, UA: NEGATIVE
PROTEIN UA: NEGATIVE
RBC UA: NEGATIVE

## 2016-12-20 MED ORDER — TERCONAZOLE 0.4 % VA CREA: 1.0000 | TOPICAL_CREAM | Freq: Every day | VAGINAL | 0 refills | Status: DC

## 2016-12-20 NOTE — Progress Notes (Signed)
This is a a work in visit for a non-obstetrically related problem during pregnancy  Chief Complaint  Patient presents with  . Vaginal Itching      31 y.o. [id number] Patient's last menstrual period was 07/15/2016 (approximate). The current method of family planning is pregnant.  Outpatient Encounter Prescriptions as of 12/20/2016  Medication Sig  . acetaminophen (TYLENOL) 500 MG tablet Take 1,000 mg by mouth every 6 (six) hours as needed.  . diphenhydrAMINE (BENADRYL) 25 MG tablet Take 25 mg by mouth every 6 (six) hours as needed.  . prenatal vitamin w/FE, FA (PRENATAL 1 + 1) 27-1 MG TABS tablet Take 1 tablet by mouth daily at 12 noon.  Marland Kitchen terconazole (TERAZOL 7) 0.4 % vaginal cream Place 1 applicator vaginally at bedtime.   No facility-administered encounter medications on file as of 12/20/2016.     Subjective [redacted]w[redacted]d Estimated Date of Delivery: 04/30/17 with itching and irritation which began 2 days ago The itching is in the vagina and is moderately severe  Nothing is made it better or worse She has not taken any medication for She has not been on any antibiotics There is no vaginal bleeding Fetal movement is good Past Medical History:  Diagnosis Date  . BV (bacterial vaginosis) 04/10/2015  . Depression   . HSV infection   . Irregular bleeding 01/17/2013  . Urinary frequency 04/10/2015  . Vaginal discharge 04/19/2013  . Vaginal odor 04/10/2015  . Vitamin D deficiency 04/11/2015  . Yeast infection 04/19/2013    Past Surgical History:  Procedure Laterality Date  . CHOLECYSTECTOMY N/A 09/05/2012   Procedure: LAPAROSCOPIC CHOLECYSTECTOMY;  Surgeon: Emelia Loron, MD;  Location: [facility] OR;  Service: General;  Laterality: N/A;  . ERCP N/A 09/04/2012   Procedure: ENDOSCOPIC RETROGRADE CHOLANGIOPANCREATOGRAPHY (ERCP);  Surgeon: Rachael Fee, MD;  Location: [facility] OR;  Service: Endoscopy;  Laterality: N/A;  . WISDOM TOOTH EXTRACTION      OB History    Gravida Para Term Preterm AB Living     5 4 4     4    SAB TAB Ectopic Multiple Live Births         0 4      No Known Allergies  Social History   Social History  . Marital status: Single    Spouse name: N/A  . Number of children: N/A  . Years of education: N/A   Social History Main Topics  . Smoking status: Former Smoker    Packs/day: 0.25    Years: 10.00    Types: Cigarettes    Quit date: 08/29/2012  . Smokeless tobacco: Never Used  . Alcohol use No  . Drug use: No     Comment: pt denies  . Sexual activity: Yes   Other Topics Concern  . None   Social History Narrative  . None    Family History  Problem Relation Age of Onset  . CAD Maternal Grandmother   . Diabetes Maternal Grandmother   . Hypertension Mother   . Diabetes Mother   . Breast cancer Maternal Aunt   . Hypertension Maternal Grandfather   . Stroke Maternal Grandfather     Medications:       Current Outpatient Prescriptions:  .  acetaminophen (TYLENOL) 500 MG tablet, Take 1,000 mg by mouth every 6 (six) hours as needed., Disp: , Rfl:  .  diphenhydrAMINE (BENADRYL) 25 MG tablet, Take 25 mg by mouth every 6 (six) hours as needed., Disp: , Rfl:  .  prenatal  vitamin w/FE, FA (PRENATAL 1 + 1) 27-1 MG TABS tablet, Take 1 tablet by mouth daily at 12 noon., Disp: 30 each, Rfl: 12 .  terconazole (TERAZOL 7) 0.4 % vaginal cream, Place 1 applicator vaginally at bedtime., Disp: 45 g, Rfl: 0  Objective Blood pressure 118/66, pulse 84, weight 223 lb (101.2 kg), last menstrual period 07/15/2016.  Abdomen gravid soft nontender no masses except the pregnancy Fetal heart rates 145 bpm Normal external female genitalia some erythema no masses The vagina has an obvious yeast infection with a large amount of cottage cheese like discharge No blood in the vagina and no lesions  Pertinent ROS No burning with urination, frequency or urgency No nausea, vomiting or diarrhea Nor fever chills or other constitutional symptoms   Labs or  studies     Impression Diagnoses this Encounter::   ICD-10-CM   1. Yeast vaginitis B37.3   2. Pregnant state, incidental Z33.1 POCT urinalysis dipstick  3. Screening for genitourinary condition Z13.89 POCT urinalysis dipstick    Established relevant diagnosis(es):   Plan/Recommendations: Meds ordered this encounter  Medications  . terconazole (TERAZOL 7) 0.4 % vaginal cream    Sig: Place 1 applicator vaginally at bedtime.    Dispense:  45 g    Refill:  0    Labs or Scans Ordered: Orders Placed This Encounter  Procedures  . POCT urinalysis dipstick    Management:: Terazol 7 is prescribed and will be used for 7 nights Otherwise keep her scheduled appointment  Follow up Keep scheduled     All questions were answered.

## 2017-01-07 ENCOUNTER — Ambulatory Visit (INDEPENDENT_AMBULATORY_CARE_PROVIDER_SITE_OTHER): Payer: Medicaid Other | Admitting: Women's Health

## 2017-01-07 ENCOUNTER — Encounter: Payer: Self-pay | Admitting: Women's Health

## 2017-01-07 VITALS — BP 132/72 | HR 113 | Wt 226.0 lb

## 2017-01-07 DIAGNOSIS — Z3A23 23 weeks gestation of pregnancy: Secondary | ICD-10-CM

## 2017-01-07 DIAGNOSIS — Z363 Encounter for antenatal screening for malformations: Secondary | ICD-10-CM

## 2017-01-07 DIAGNOSIS — Z331 Pregnant state, incidental: Secondary | ICD-10-CM

## 2017-01-07 DIAGNOSIS — Z1389 Encounter for screening for other disorder: Secondary | ICD-10-CM

## 2017-01-07 DIAGNOSIS — O283 Abnormal ultrasonic finding on antenatal screening of mother: Secondary | ICD-10-CM

## 2017-01-07 DIAGNOSIS — Z3482 Encounter for supervision of other normal pregnancy, second trimester: Secondary | ICD-10-CM

## 2017-01-07 DIAGNOSIS — Z23 Encounter for immunization: Secondary | ICD-10-CM | POA: Diagnosis not present

## 2017-01-07 LAB — POCT URINALYSIS DIPSTICK
GLUCOSE UA: NEGATIVE
Ketones, UA: NEGATIVE
Leukocytes, UA: NEGATIVE
NITRITE UA: NEGATIVE
PROTEIN UA: NEGATIVE
RBC UA: NEGATIVE

## 2017-01-07 NOTE — Patient Instructions (Signed)
[name], I greatly value your feedback.  If you receive a survey following your visit with us today, we appreciate you taking the time to fill it out.  Thanks, Joellyn HaffKim Booker, CNM, WHNP-BC   You will have your sugar test next visit.  Please do not eat or drink anything after midnight the night before you come, not even water.  You will be here for at least two hours.     Call the office [phone]) or [facility] to [facility] if:  You begin to have strong, frequent contractions  Your water breaks.  Sometimes it is a big gush of fluid, sometimes it is just a trickle that keeps getting your panties wet or running down your legs  You have vaginal bleeding.  It is normal to have a small amount of spotting if your cervix was checked.   You don't feel your baby moving like normal.  If you don't, get you something to eat and drink and lay down and focus on feeling your baby move.   If your baby is still not moving like normal, you should call the office or [facility] to [facility].  Second Trimester of Pregnancy The second trimester is from week 13 through week 28, months 4 through 6. The second trimester is often a time when you feel your best. Your body has also adjusted to being pregnant, and you begin to feel better physically. Usually, morning sickness has lessened or quit completely, you may have more energy, and you may have an increase in appetite. The second trimester is also a time when the fetus is growing rapidly. At the end of the sixth month, the fetus is about 9 inches long and weighs about 1 pounds. You will likely begin to feel the baby move (quickening) between 18 and 20 weeks of the pregnancy. BODY CHANGES Your body goes through many changes during pregnancy. The changes vary from woman to woman.   Your weight will continue to increase. You will notice your lower abdomen bulging out.  You may begin to get stretch marks on your hips, abdomen, and breasts.  You may develop  headaches that can be relieved by medicines approved by your health care provider.  You may urinate more often because the fetus is pressing on your bladder.  You may develop or continue to have heartburn as a result of your pregnancy.  You may develop constipation because certain hormones are causing the muscles that push waste through your intestines to slow down.  You may develop hemorrhoids or swollen, bulging veins (varicose veins).  You may have back pain because of the weight gain and pregnancy hormones relaxing your joints between the bones in your pelvis and as a result of a shift in weight and the muscles that support your balance.  Your breasts will continue to grow and be tender.  Your gums may bleed and may be sensitive to brushing and flossing.  Dark spots or blotches (chloasma, mask of pregnancy) may develop on your face. This will likely fade after the baby is born.  A dark line from your belly button to the pubic area (linea nigra) may appear. This will likely fade after the baby is born.  You may have changes in your hair. These can include thickening of your hair, rapid growth, and changes in texture. Some women also have hair loss during or after pregnancy, or hair that feels dry or thin. Your hair will most likely return to normal after your  baby is born. WHAT TO EXPECT AT YOUR PRENATAL VISITS During a routine prenatal visit:  You will be weighed to make sure you and the fetus are growing normally.  Your blood pressure will be taken.  Your abdomen will be measured to track your baby's growth.  The fetal heartbeat will be listened to.  Any test results from the previous visit will be discussed. Your health care provider may ask you:  How you are feeling.  If you are feeling the baby move.  If you have had any abnormal symptoms, such as leaking fluid, bleeding, severe headaches, or abdominal cramping.  If you have any questions. Other tests that may be  performed during your second trimester include:  Blood tests that check for:  Low iron levels (anemia).  Gestational diabetes (between 24 and 28 weeks).  Rh antibodies.  Urine tests to check for infections, diabetes, or protein in the urine.  An ultrasound to confirm the proper growth and development of the baby.  An amniocentesis to check for possible genetic problems.  Fetal screens for spina bifida and Down syndrome. HOME CARE INSTRUCTIONS   Avoid all smoking, herbs, alcohol, and unprescribed drugs. These chemicals affect the formation and growth of the baby.  Follow your health care provider's instructions regarding medicine use. There are medicines that are either safe or unsafe to take during pregnancy.  Exercise only as directed by your health care provider. Experiencing uterine cramps is a good sign to stop exercising.  Continue to eat regular, healthy meals.  Wear a good support bra for breast tenderness.  Do not use hot tubs, steam rooms, or saunas.  Wear your seat belt at all times when driving.  Avoid raw meat, uncooked cheese, cat litter boxes, and soil used by cats. These carry germs that can cause birth defects in the baby.  Take your prenatal vitamins.  Try taking a stool softener (if your health care provider approves) if you develop constipation. Eat more high-fiber foods, such as fresh vegetables or fruit and whole grains. Drink plenty of fluids to keep your urine clear or pale yellow.  Take warm sitz baths to soothe any pain or discomfort caused by hemorrhoids. Use hemorrhoid cream if your health care provider approves.  If you develop varicose veins, wear support hose. Elevate your feet for 15 minutes, 3-4 times a day. Limit salt in your diet.  Avoid heavy lifting, wear low heel shoes, and practice good posture.  Rest with your legs elevated if you have leg cramps or low back pain.  Visit your dentist if you have not gone yet during your pregnancy.  Use a soft toothbrush to brush your teeth and be gentle when you floss.  A sexual relationship may be continued unless your health care provider directs you otherwise.  Continue to go to all your prenatal visits as directed by your health care provider. SEEK MEDICAL CARE IF:   You have dizziness.  You have mild pelvic cramps, pelvic pressure, or nagging pain in the abdominal area.  You have persistent nausea, vomiting, or diarrhea.  You have a bad smelling vaginal discharge.  You have pain with urination. SEEK IMMEDIATE MEDICAL CARE IF:   You have a fever.  You are leaking fluid from your vagina.  You have spotting or bleeding from your vagina.  You have severe abdominal cramping or pain.  You have rapid weight gain or loss.  You have shortness of breath with chest pain.  You notice sudden or extreme   swelling of your face, hands, ankles, feet, or legs.  You have not felt your baby move in over an hour.  You have severe headaches that do not go away with medicine.  You have vision changes. Document Released: 02/09/2001 Document Revised: 02/20/2013 Document Reviewed: 04/18/2012 [facility] Patient Information 2015 [employer] [place]. This information is not intended to replace advice given to you by your health care provider. Make sure you discuss any questions you have with your health care provider.  PROTECT YOURSELF & YOUR BABY FROM THE FLU! Because you are pregnant, we at [facility], along with the Centers for Disease Control (CDC), recommend that you receive the flu vaccine to protect yourself and your baby from the flu. The flu is more likely to cause severe illness in pregnant women than in women of reproductive age who are not pregnant. Changes in the immune system, heart, and lungs during pregnancy make pregnant women (and women up to two weeks postpartum) more prone to severe illness from flu, including illness resulting in hospitalization. Flu also may be harmful for a  pregnant woman's developing baby. A common flu symptom is fever, which may be associated with neural tube defects and other adverse outcomes for a developing baby. Getting vaccinated can also help protect a baby after birth from flu. (Mom passes antibodies onto the developing baby during her pregnancy.)  A Flu Vaccine is the Best Protection Against Flu Getting a flu vaccine is the first and most important step in protecting against flu. Pregnant women should get a flu shot and not the live attenuated influenza vaccine (LAIV), also known as nasal spray flu vaccine. Flu vaccines given during pregnancy help protect both the mother and her baby from flu. Vaccination has been shown to reduce the risk of flu-associated acute respiratory infection in pregnant women by up to one-half. A 2018 study showed that getting a flu shot reduced a pregnant woman's risk of being hospitalized with flu by an average of 40 percent. Pregnant women who get a flu vaccine are also helping to protect their babies from flu illness for the first several months after their birth, when they are too young to get vaccinated.   A Long Record of Safety for Flu Shots in Pregnant Women Flu shots have been given to millions of pregnant women over many years with a good safety record. There is a lot of evidence that flu vaccines can be given safely during pregnancy; though these data are limited for the first trimester. The CDC recommends that pregnant women get vaccinated during any trimester of their pregnancy. It is very important for pregnant women to get the flu shot.   Other Preventive Actions In addition to getting a flu shot, pregnant women should take the same everyday preventive actions the CDC recommends of everyone, including covering coughs, washing hands often, and avoiding people who are sick.  Symptoms and Treatment If you get sick with flu symptoms call your doctor right away. There are antiviral drugs that can treat flu  illness and prevent serious flu complications. The CDC recommends prompt treatment for people who have influenza infection or suspected influenza infection and who are at high risk of serious flu complications, such as people with asthma, diabetes (including gestational diabetes), or heart disease. Early treatment of influenza in hospitalized pregnant women has been shown to reduce the length of the hospital stay.  Symptoms Flu symptoms include fever, cough, sore throat, runny or stuffy nose, body aches, headache, chills and fatigue. Some people  may also have vomiting and diarrhea. People may be infected with the flu and have respiratory symptoms without a fever.  Early Treatment is Important for Pregnant Women Treatment should begin as soon as possible because antiviral drugs work best when started early (within 48 hours after symptoms start). Antiviral drugs can make your flu illness milder and make you feel better faster. They may also prevent serious health problems that can result from flu illness. Oral oseltamivir (Tamiflu) is the preferred treatment for pregnant women because it has the most studies available to suggest that it is safe and beneficial. Antiviral drugs require a prescription from your provider. Having a fever caused by flu infection or other infections early in pregnancy may be linked to birth defects in a baby. In addition to taking antiviral drugs, pregnant women who get a fever should treat their fever with Tylenol (acetaminophen) and contact their provider immediately.  When to Seek Emergency Medical Care If you are pregnant and have any of these signs, seek care immediately:  Difficulty breathing or shortness of breath  Pain or pressure in the chest or abdomen  Sudden dizziness  Confusion  Severe or persistent vomiting  High fever that is not responding to Tylenol (or store brand equivalent)  Decreased or no movement of your  baby  [email or web address]

## 2017-01-07 NOTE — Progress Notes (Signed)
   LOW-RISK PREGNANCY VISIT Patient name: [name] MRN [id number]  Date of birth: 24-Aug-1985 Chief Complaint:   Routine Prenatal Visit  History of Present Illness:   [name] is a 31 y.o. [id number] female at 9082w6d with an Estimated Date of Delivery: 04/30/17 being seen today for ongoing management of a low-risk pregnancy.  Today she reports no complaints. Had limited view spine on anatomy scan, needs f/u u/s. Also had EICF- was not aware. Had neg nt/it.  . Vag. Bleeding: None.  Movement: Present. denies leaking of fluid. Review of Systems:   Pertinent items are noted in HPI Denies abnormal vaginal discharge w/ itching/odor/irritation, headaches, visual changes, shortness of breath, chest pain, abdominal pain, severe nausea/vomiting, or problems with urination or bowel movements unless otherwise stated above. Pertinent History Reviewed:  Reviewed past medical,surgical, social, obstetrical and family history.  Reviewed problem list, medications and allergies. Physical Assessment:   Vitals:   01/07/17 1247  BP: 132/72  Pulse: (!) 113  Weight: 226 lb (102.5 kg)  Body mass index is 36.48 kg/m.        Physical Examination:   General appearance: Well appearing, and in no distress  Mental status: Alert, oriented to person, place, and time  Skin: Warm & dry  Cardiovascular: Normal heart rate noted  Respiratory: Normal respiratory effort, no distress  Abdomen: Soft, gravid, nontender  Pelvic: Cervical exam deferred         Extremities: Edema: None  Fetal Status: Fetal Heart Rate (bpm): 160 Fundal Height: 24 cm Movement: Present    Results for orders placed or performed in visit on 01/07/17 (from the past 24 hour(s))  POCT urinalysis dipstick   Collection Time: 01/07/17 12:49 PM  Result Value Ref Range   Color, UA     Clarity, UA     Glucose, UA neg    Bilirubin, UA     Ketones, UA neg    Spec Grav, UA  1.010 - 1.025   Blood, UA neg    pH, UA  5.0 - 8.0   Protein,  UA neg    Urobilinogen, UA  0.2 or 1.0 E.U./dL   Nitrite, UA neg    Leukocytes, UA Negative Negative    Assessment & Plan:  1) Low-risk pregnancy G5P4004 at 7582w6d with an Estimated Date of Delivery: 04/30/17   2) Limited view spine & Rt EICF on anatomy u/s, repeat u/s next visit. Discussed and gave printed info on EICF. NT/IT was neg.    Labs/procedures today: flu shot  Plan:  Continue routine obstetrical care   Reviewed: Preterm labor symptoms and general obstetric precautions including but not limited to vaginal bleeding, contractions, leaking of fluid and fetal movement were reviewed in detail with the patient.  All questions were answered  Follow-up: Return in about 4 weeks (around 02/04/2017) for LROB, PN2, US:OB F/U spine.  Orders Placed This Encounter  Procedures  . US OB Follow Up  . POCT urinalysis dipstick   Marge DuncansBooker, Shail Urbas Randall CNM, [facility]NP-BC 01/07/2017 1:04 PM

## 2017-02-04 ENCOUNTER — Other Ambulatory Visit: Payer: Medicaid Other

## 2017-02-04 ENCOUNTER — Encounter: Payer: Medicaid Other | Admitting: Obstetrics & Gynecology

## 2017-02-11 ENCOUNTER — Other Ambulatory Visit: Payer: Self-pay | Admitting: Women's Health

## 2017-02-11 DIAGNOSIS — O283 Abnormal ultrasonic finding on antenatal screening of mother: Secondary | ICD-10-CM

## 2017-02-11 DIAGNOSIS — IMO0002 Reserved for concepts with insufficient information to code with codable children: Secondary | ICD-10-CM

## 2017-02-11 DIAGNOSIS — Z363 Encounter for antenatal screening for malformations: Secondary | ICD-10-CM

## 2017-02-11 DIAGNOSIS — Z0489 Encounter for examination and observation for other specified reasons: Secondary | ICD-10-CM

## 2017-02-14 ENCOUNTER — Ambulatory Visit (INDEPENDENT_AMBULATORY_CARE_PROVIDER_SITE_OTHER): Payer: Medicaid Other

## 2017-02-14 ENCOUNTER — Encounter: Payer: Self-pay | Admitting: Obstetrics & Gynecology

## 2017-02-14 ENCOUNTER — Other Ambulatory Visit: Payer: Medicaid Other

## 2017-02-14 ENCOUNTER — Other Ambulatory Visit: Payer: Self-pay

## 2017-02-14 ENCOUNTER — Ambulatory Visit (INDEPENDENT_AMBULATORY_CARE_PROVIDER_SITE_OTHER): Payer: Medicaid Other | Admitting: Obstetrics & Gynecology

## 2017-02-14 VITALS — BP 132/84 | HR 101 | Wt 229.0 lb

## 2017-02-14 DIAGNOSIS — Z3A29 29 weeks gestation of pregnancy: Secondary | ICD-10-CM | POA: Diagnosis not present

## 2017-02-14 DIAGNOSIS — IMO0002 Reserved for concepts with insufficient information to code with codable children: Secondary | ICD-10-CM

## 2017-02-14 DIAGNOSIS — Z331 Pregnant state, incidental: Secondary | ICD-10-CM

## 2017-02-14 DIAGNOSIS — Z3482 Encounter for supervision of other normal pregnancy, second trimester: Secondary | ICD-10-CM

## 2017-02-14 DIAGNOSIS — Z3483 Encounter for supervision of other normal pregnancy, third trimester: Secondary | ICD-10-CM

## 2017-02-14 DIAGNOSIS — Z1389 Encounter for screening for other disorder: Secondary | ICD-10-CM | POA: Diagnosis not present

## 2017-02-14 DIAGNOSIS — Z3402 Encounter for supervision of normal first pregnancy, second trimester: Secondary | ICD-10-CM

## 2017-02-14 DIAGNOSIS — Z363 Encounter for antenatal screening for malformations: Secondary | ICD-10-CM

## 2017-02-14 DIAGNOSIS — O283 Abnormal ultrasonic finding on antenatal screening of mother: Secondary | ICD-10-CM | POA: Diagnosis not present

## 2017-02-14 DIAGNOSIS — Z0489 Encounter for examination and observation for other specified reasons: Secondary | ICD-10-CM

## 2017-02-14 DIAGNOSIS — Z131 Encounter for screening for diabetes mellitus: Secondary | ICD-10-CM

## 2017-02-14 DIAGNOSIS — Z362 Encounter for other antenatal screening follow-up: Secondary | ICD-10-CM | POA: Diagnosis not present

## 2017-02-14 LAB — POCT URINALYSIS DIPSTICK
GLUCOSE UA: NEGATIVE
Ketones, UA: NEGATIVE
Leukocytes, UA: NEGATIVE
Nitrite, UA: NEGATIVE
Protein, UA: NEGATIVE
RBC UA: NEGATIVE

## 2017-02-14 NOTE — Progress Notes (Signed)
[id number] 5754w2d Estimated Date of Delivery: 04/30/17  Blood pressure 132/84, pulse (!) 101, weight 229 lb (103.9 kg), last menstrual period 07/15/2016.   BP weight and urine results all reviewed and noted.  Please refer to the obstetrical flow sheet for the fundal height and fetal heart rate documentation:  Patient reports good fetal movement, denies any bleeding and no rupture of membranes symptoms or regular contractions. Patient is without complaints. All questions were answered.  Orders Placed This Encounter  Procedures  . POCT urinalysis dipstick    Plan:  Continued routine obstetrical care, sonogram is normal  Return in about 3 weeks (around 03/07/2017) for LROB.

## 2017-02-14 NOTE — Progress Notes (Signed)
US 29+2 wks,breech,cx 3.9 cm,ant pl gr 1,normal ovaries bilat,fhr 150 bpm,RVEICF  N/C,anatomy of the spine complete,no obvious abnormalities of the spine,afi 22 cm,EFW 1730 g 80 % AC 99%

## 2017-02-15 ENCOUNTER — Encounter: Payer: Self-pay | Admitting: Women's Health

## 2017-02-15 DIAGNOSIS — Z8632 Personal history of gestational diabetes: Secondary | ICD-10-CM

## 2017-02-15 HISTORY — DX: Personal history of gestational diabetes: Z86.32

## 2017-02-15 LAB — GLUCOSE TOLERANCE, 2 HOURS W/ 1HR
GLUCOSE, FASTING: 87 mg/dL (ref 65–91)
Glucose, 1 hour: 185 mg/dL — ABNORMAL HIGH (ref 65–179)
Glucose, 2 hour: 138 mg/dL (ref 65–152)

## 2017-02-15 LAB — CBC
HEMATOCRIT: 34.2 % (ref 34.0–46.6)
HEMOGLOBIN: 11.4 g/dL (ref 11.1–15.9)
MCH: 30 pg (ref 26.6–33.0)
MCHC: 33.3 g/dL (ref 31.5–35.7)
MCV: 90 fL (ref 79–97)
Platelets: 256 10*3/uL (ref 150–379)
RBC: 3.8 x10E6/uL (ref 3.77–5.28)
RDW: 15 % (ref 12.3–15.4)
WBC: 10.7 10*3/uL (ref 3.4–10.8)

## 2017-02-15 LAB — ANTIBODY SCREEN: ANTIBODY SCREEN: NEGATIVE

## 2017-02-15 LAB — HIV ANTIBODY (ROUTINE TESTING W REFLEX): HIV SCREEN 4TH GENERATION: NONREACTIVE

## 2017-02-15 LAB — RPR: RPR: NONREACTIVE

## 2017-02-18 ENCOUNTER — Other Ambulatory Visit: Payer: Self-pay | Admitting: Women's Health

## 2017-02-18 ENCOUNTER — Telehealth: Payer: Self-pay | Admitting: *Deleted

## 2017-02-18 DIAGNOSIS — O24410 Gestational diabetes mellitus in pregnancy, diet controlled: Secondary | ICD-10-CM

## 2017-02-18 NOTE — Telephone Encounter (Signed)
Informed patient she has GDM and referral was sent to dietician and if she did not hear from them in about a week, to let me know. Informed she will need to check sugars 4 times daily and bring log to appointments for provider to review.  Verbalized understanding.

## 2017-03-01 NOTE — L&D Delivery Note (Signed)
Delivery Note Pt began to feel urge to push and was noted to be complete at 0020; she pushed with a couple of ctx and at 12:30 AM a viable female was delivered via Vaginal, Spontaneous (Presentation: ROA).  APGAR: 9, 9; weight: pending.  Infant dried and placed on pt's abd; cord clamped and cut by FOB; hospital cord blood sample collected. Placenta status: spont ,intact .  Cord: 3 vessel  Anesthesia:  none Episiotomy:  none Lacerations:  sm 1st deg superficial abrasion- not repaired Est. Blood Loss (mL):  250  Mom to postpartum.  Baby to Couplet care / Skin to Skin.  [name], [name] CNM 04/21/2017, 12:45 AM  Please schedule this patient for Postpartum visit in: 6 weeks with the following provider: Lab only For C/S patients schedule nurse incision check in weeks 2 weeks: no High risk pregnancy complicated by: GDM Delivery mode:  SVD Anticipated Birth Control:  Nexplanon PP Procedures needed: 2 hour GTT  Schedule Integrated BH visit: no  I think she is planning a circ at [facility], too.

## 2017-03-02 ENCOUNTER — Encounter: Payer: Medicaid Other | Attending: Advanced Practice Midwife | Admitting: Nutrition

## 2017-03-02 VITALS — Ht 64.0 in | Wt 233.0 lb

## 2017-03-02 DIAGNOSIS — Z3A00 Weeks of gestation of pregnancy not specified: Secondary | ICD-10-CM | POA: Diagnosis not present

## 2017-03-02 DIAGNOSIS — O0940 Supervision of pregnancy with grand multiparity, unspecified trimester: Secondary | ICD-10-CM

## 2017-03-02 DIAGNOSIS — O24410 Gestational diabetes mellitus in pregnancy, diet controlled: Secondary | ICD-10-CM | POA: Diagnosis present

## 2017-03-02 DIAGNOSIS — E669 Obesity, unspecified: Secondary | ICD-10-CM

## 2017-03-02 DIAGNOSIS — O24419 Gestational diabetes mellitus in pregnancy, unspecified control: Secondary | ICD-10-CM

## 2017-03-02 NOTE — Progress Notes (Signed)
  Medical Nutrition Therapy:  Appt start time: 1630 end time:  1700.  Assessment:  Primary concerns today. Gestational Diabetes. [redacted] weeks gestation. Due April 30, 2017. Pt came hour late for her appt. Limited visit today. Will address security questions next visit.  LIves with 4 children. FOB came with her but went to sit in car during appt. She does coooking and shopping in home. Says she may have had GDM in a previous pregnancy.        Doesn't have a meter to test her BS with yet. Appears engaged to test and eat better to improve blood sugars. 1 hr BS 185 mg/dl  FBS 87 mg/dl, 2 hr 253138 mg/dl  Wt Readings from Last 3 Encounters:  03/02/17 233 lb (105.7 kg)  02/14/17 229 lb (103.9 kg)  01/07/17 226 lb (102.5 kg)   Ht Readings from Last 3 Encounters:  03/02/17 5\' 4"  (1.626 m)  09/20/16 5\' 6"  (1.676 m)  08/19/16 5' 4.5" (1.638 m)   CMP Latest Ref Rng & Units 09/20/2016 04/10/2015 08/09/2014  Glucose 65 - 99 mg/dL 99 93 99  BUN 6 - 20 mg/dL 7 7 7   Creatinine 0.44 - 1.00 mg/dL 6.640.60 4.030.66 4.740.70  Sodium 135 - 145 mmol/L 134(L) 142 143  Potassium 3.5 - 5.1 mmol/L 3.8 4.6 3.4(L)  Chloride 101 - 111 mmol/L 102 102 106  CO2 22 - 32 mmol/L 25 25 -  Calcium 8.9 - 10.3 mg/dL 2.5(Z8.7(L) 9.0 -  Total Protein 6.5 - 8.1 g/dL 6.7 6.6 -  Total Bilirubin 0.3 - 1.2 mg/dL 0.7 0.5 -  Alkaline Phos 38 - 126 U/L 60 60 -  AST 15 - 41 U/L 16 13 -  ALT 14 - 54 U/L 21 11 -    Preferred Learning Style:     No preference indicated   Learning Readiness:   Ready  Change in progress   MEDICATIONS: See list   DIETARY INTAKE: Eats 3 meals per day.  Usual physical activity: ADL  Estimated energy needs: 1800  calories 200  g carbohydrates 135 g protein 50 g fat  Progress Towards Goal(s):  In progress.   Nutritional Diagnosis:  NB-1.1 Food and nutrition-related knowledge deficit As related to Gestational DM.  As evidenced by 1 hr BS > 160.    Intervention:  My Plate, portion sizes, meal planning,  importance of drinking water, avoiding processed junk food, sweets, cakes, cookies and desserts. Encouraged to focus on more fresh vegetables, grilled meats and avoid junk food. Trained how to use the [employer] Guide meter, target goals for BS and importance of healthy eating for her and the baby.  Teaching Method Utilized:  Visual Auditory Hands on  Handouts given during visit include:  The Plate Method    Barriers to learning/adherence to lifestyle change: none  Demonstrated degree of understanding via:  Teach Back   Monitoring/Evaluation:  Dietary intake, exercise, meal planning, and body weight in 1 week(s). Will continue GDM education next week and review BS logs.

## 2017-03-03 ENCOUNTER — Ambulatory Visit: Payer: Self-pay | Admitting: Nutrition

## 2017-03-03 ENCOUNTER — Encounter: Payer: Self-pay | Admitting: Nutrition

## 2017-03-03 NOTE — Patient Instructions (Signed)
Goals 1. Follow My Plate 2. Test blood sugars before breakfast and 2 hours after breakfast, lunch and dinner and record on sheets. Bring meter and BS log to all appts. 3. No sugar, sweets, sodas, juices or junk food. 4. Drink only water.

## 2017-03-07 ENCOUNTER — Ambulatory Visit (INDEPENDENT_AMBULATORY_CARE_PROVIDER_SITE_OTHER): Payer: Medicaid Other | Admitting: Women's Health

## 2017-03-07 ENCOUNTER — Encounter: Payer: Self-pay | Admitting: Women's Health

## 2017-03-07 VITALS — BP 120/70 | HR 97 | Wt 230.4 lb

## 2017-03-07 DIAGNOSIS — Z3A32 32 weeks gestation of pregnancy: Secondary | ICD-10-CM | POA: Diagnosis not present

## 2017-03-07 DIAGNOSIS — O24410 Gestational diabetes mellitus in pregnancy, diet controlled: Secondary | ICD-10-CM | POA: Diagnosis not present

## 2017-03-07 DIAGNOSIS — Z1389 Encounter for screening for other disorder: Secondary | ICD-10-CM | POA: Diagnosis not present

## 2017-03-07 DIAGNOSIS — Z331 Pregnant state, incidental: Secondary | ICD-10-CM | POA: Diagnosis not present

## 2017-03-07 DIAGNOSIS — O0990 Supervision of high risk pregnancy, unspecified, unspecified trimester: Secondary | ICD-10-CM

## 2017-03-07 DIAGNOSIS — O2603 Excessive weight gain in pregnancy, third trimester: Secondary | ICD-10-CM

## 2017-03-07 DIAGNOSIS — O0993 Supervision of high risk pregnancy, unspecified, third trimester: Secondary | ICD-10-CM

## 2017-03-07 LAB — POCT URINALYSIS DIPSTICK
Blood, UA: NEGATIVE
GLUCOSE UA: NEGATIVE
Ketones, UA: NEGATIVE
LEUKOCYTES UA: NEGATIVE
NITRITE UA: NEGATIVE

## 2017-03-07 NOTE — Patient Instructions (Addendum)
[name], I greatly value your feedback.  If you receive a survey following your visit with [place] today, we appreciate you taking the time to fill it out.  Thanks, [name], CNM, WHNP-BC  Check blood sugars 4 times a day: in the morning before eating/drinking anything (<95) and 2 hours after eating breakfast, lunch, and supper (<120).     Call the office [phone]) or go to [facility] if:  You begin to have strong, frequent contractions  Your water breaks.  Sometimes it is a big gush of fluid, sometimes it is just a trickle that keeps getting your panties wet or running down your legs  You have vaginal bleeding.  It is normal to have a small amount of spotting if your cervix was checked.   You don't feel your baby moving like normal.  If you don't, get you something to eat and drink and lay down and focus on feeling your baby move.  You should feel at least 10 movements in 2 hours.  If you don't, you should call the office or go to [facility].    Tdap Vaccine  It is recommended that you get the Tdap vaccine during the third trimester of EACH pregnancy to help protect your baby from getting pertussis (whooping cough)  27-36 weeks is the BEST time to do this so that you can pass the protection on to your baby. During pregnancy is better than after pregnancy, but if you are unable to get it during pregnancy it will be offered at the hospital.   You can get this vaccine at the health department or your family doctor  Everyone who will be around your baby should also be up-to-date on their vaccines. Adults (who are not pregnant) only need 1 dose of Tdap during adulthood.   Third Trimester of Pregnancy The third trimester is from week 29 through week 42, months 7 through 9. The third trimester is a time when the fetus is growing rapidly. At the end of the ninth month, the fetus is about 20 inches in length and weighs 6-10 pounds.  BODY CHANGES Your body goes through many  changes during pregnancy. The changes vary from woman to woman.   Your weight will continue to increase. You can expect to gain 25-35 pounds (11-16 kg) by the end of the pregnancy.  You may begin to get stretch marks on your hips, abdomen, and breasts.  You may urinate more often because the fetus is moving lower into your pelvis and pressing on your bladder.  You may develop or continue to have heartburn as a result of your pregnancy.  You may develop constipation because certain hormones are causing the muscles that push waste through your intestines to slow down.  You may develop hemorrhoids or swollen, bulging veins (varicose veins).  You may have pelvic pain because of the weight gain and pregnancy hormones relaxing your joints between the bones in your pelvis. Backaches may result from overexertion of the muscles supporting your posture.  You may have changes in your hair. These can include thickening of your hair, rapid growth, and changes in texture. Some women also have hair loss during or after pregnancy, or hair that feels dry or thin. Your hair will most likely return to normal after your baby is born.  Your breasts will continue to grow and be tender. A yellow discharge may leak from your breasts called colostrum.  Your belly button may stick out.  You may feel  short of breath because of your expanding uterus.  You may notice the fetus "dropping," or moving lower in your abdomen.  You may have a bloody mucus discharge. This usually occurs a few days to a week before labor begins.  Your cervix becomes thin and soft (effaced) near your due date. WHAT TO EXPECT AT YOUR PRENATAL EXAMS  You will have prenatal exams every 2 weeks until week 36. Then, you will have weekly prenatal exams. During a routine prenatal visit:  You will be weighed to make sure you and the fetus are growing normally.  Your blood pressure is taken.  Your abdomen will be measured to track your baby's  growth.  The fetal heartbeat will be listened to.  Any test results from the previous visit will be discussed.  You may have a cervical check near your due date to see if you have effaced. At around 36 weeks, your caregiver will check your cervix. At the same time, your caregiver will also perform a test on the secretions of the vaginal tissue. This test is to determine if a type of bacteria, Group B streptococcus, is present. Your caregiver will explain this further. Your caregiver may ask you:  What your birth plan is.  How you are feeling.  If you are feeling the baby move.  If you have had any abnormal symptoms, such as leaking fluid, bleeding, severe headaches, or abdominal cramping.  If you have any questions. Other tests or screenings that may be performed during your third trimester include:  Blood tests that check for low iron levels (anemia).  Fetal testing to check the health, activity level, and growth of the fetus. Testing is done if you have certain medical conditions or if there are problems during the pregnancy. FALSE LABOR You may feel small, irregular contractions that eventually go away. These are called Braxton Hicks contractions, or false labor. Contractions may last for hours, days, or even weeks before true labor sets in. If contractions come at regular intervals, intensify, or become painful, it is best to be seen by your caregiver.  SIGNS OF LABOR   Menstrual-like cramps.  Contractions that are 5 minutes apart or less.  Contractions that start on the top of the uterus and spread down to the lower abdomen and back.  A sense of increased pelvic pressure or back pain.  A watery or bloody mucus discharge that comes from the vagina. If you have any of these signs before the 37th week of pregnancy, call your caregiver right away. You need to go to the hospital to get checked immediately. HOME CARE INSTRUCTIONS   Avoid all smoking, herbs, alcohol, and  unprescribed drugs. These chemicals affect the formation and growth of the baby.  Follow your caregiver's instructions regarding medicine use. There are medicines that are either safe or unsafe to take during pregnancy.  Exercise only as directed by your caregiver. Experiencing uterine cramps is a good sign to stop exercising.  Continue to eat regular, healthy meals.  Wear a good support bra for breast tenderness.  Do not use hot tubs, steam rooms, or saunas.  Wear your seat belt at all times when driving.  Avoid raw meat, uncooked cheese, cat litter boxes, and soil used by cats. These carry germs that can cause birth defects in the baby.  Take your prenatal vitamins.  Try taking a stool softener (if your caregiver approves) if you develop constipation. Eat more high-fiber foods, such as fresh vegetables or fruit and whole  grains. Drink plenty of fluids to keep your urine clear or pale yellow.  Take warm sitz baths to soothe any pain or discomfort caused by hemorrhoids. Use hemorrhoid cream if your caregiver approves.  If you develop varicose veins, wear support hose. Elevate your feet for 15 minutes, 3-4 times a day. Limit salt in your diet.  Avoid heavy lifting, wear low heal shoes, and practice good posture.  Rest a lot with your legs elevated if you have leg cramps or low back pain.  Visit your dentist if you have not gone during your pregnancy. Use a soft toothbrush to brush your teeth and be gentle when you floss.  A sexual relationship may be continued unless your caregiver directs you otherwise.  Do not travel far distances unless it is absolutely necessary and only with the approval of your caregiver.  Take prenatal classes to understand, practice, and ask questions about the labor and delivery.  Make a trial run to the hospital.  Pack your hospital bag.  Prepare the baby's nursery.  Continue to go to all your prenatal visits as directed by your caregiver. SEEK  MEDICAL CARE IF:  You are unsure if you are in labor or if your water has broken.  You have dizziness.  You have mild pelvic cramps, pelvic pressure, or nagging pain in your abdominal area.  You have persistent nausea, vomiting, or diarrhea.  You have a bad smelling vaginal discharge.  You have pain with urination. SEEK IMMEDIATE MEDICAL CARE IF:   You have a fever.  You are leaking fluid from your vagina.  You have spotting or bleeding from your vagina.  You have severe abdominal cramping or pain.  You have rapid weight loss or gain.  You have shortness of breath with chest pain.  You notice sudden or extreme swelling of your face, hands, ankles, feet, or legs.  You have not felt your baby move in over an hour.  You have severe headaches that do not go away with medicine.  You have vision changes. Document Released: 02/09/2001 Document Revised: 02/20/2013 Document Reviewed: 04/18/2012 [facility] Patient Information 2015 [place], [place]. This information is not intended to replace advice given to you by your health care provider. Make sure you discuss any questions you have with your health care provider.   Gestational Diabetes Mellitus, Self Care When you have gestational diabetes (gestational diabetes mellitus), you must keep your blood sugar (glucose) under control. You can do this with:  Nutrition.  Exercise.  Lifestyle changes.  Medicines or insulin, if needed.  Support from your doctors and others.  How do I manage my blood sugar?  Check your blood sugar every day during pregnancy. Check it as often as told.  Call your doctor if your blood sugar is above your goal numbers for 2 tests in a row. Your doctor will set treatment goals for you. Try to have these blood sugars:  After not eating for a long time (after fasting): at or below 95 mg/dL (5.3 mmol/L).  After meals (postprandial): ? One hour after a meal: at or below 140 mg/dL (7.8 mmol/L). ? Two  hours after a meal: at or below 120 mg/dL (6.7 mmol/L).  A1c (hemoglobin A1c) level: 6-6.5%.  What do I need to know about high blood sugar? High blood sugar is called hyperglycemia. Know the early signs of high blood sugar. Signs include:  Feeling: ? Thirsty. ? Hungry. ? Very tired.  Needing to pee (urinate) more than usual.  Blurry vision.  What do I need to know about low blood sugar? Low blood sugar is called hypoglycemia. This is when blood sugar is at or below 70 mg/dL (3.9 mmol/L). Symptoms may include:  Feeling: ? Hungry. ? Worried or nervous (anxious). ? Sweaty or clammy. ? Confused. ? Dizzy. ? Sleepy. ? Sick to your stomach (nauseous).  Having: ? A fast heartbeat. ? A headache. ? A change in vision. ? Jerky movements that you cannot control (seizure). ? Nightmares. ? Tingling or no feeling (numbness) around the mouth, lips, or tongue.  Having trouble with: ? Talking. ? Paying attention (concentrating). ? Moving (coordination). ? Sleeping.  Shaking.  Passing out (fainting).  Getting upset easily (irritability).  Treating low blood sugar  To treat low blood sugar, eat or drink something sugary right away. If you can think clearly and swallow safely, follow the 15:15 rule:  Take 15 grams of a fast-acting carb (carbohydrate). Some fast-acting carbs are: ? 1 tube of glucose gel. ? 3 sugar tablets (glucose pills). ? 6-8 pieces of hard candy. ? 4 oz (120 mL) of fruit juice. ? 4 oz (120 mL) regular (not diet) soda.  Check your blood sugar 15 minutes after you take the carb.  If your blood sugar is still at or below 70 mg/dL (3.9 mmol/L), take 15 grams of a carb again.  If your blood sugar does not go above 70 mg/dL (3.9 mmol/L) after 3 tries, get help right away.  After your blood sugar goes back to normal, eat a meal or a snack within 1 hour.  Treating very low blood sugar If your blood sugar is at or below 54 mg/dL (3 mmol/L), you have very low  blood sugar (severe hypoglycemia). This is an emergency. Do not wait to see if the symptoms will go away. Get medical help right away. Call your local emergency services (911 in the [place] Do not drive yourself to the hospital. If you have very low blood sugar and you cannot eat or drink, you may need a glucagon shot (injection). A family member or friend should learn:  How to check your blood sugar.  How to give you a glucagon shot.  Ask your doctor if you need a glucagon shot kit at home. What else is important to manage my diabetes? Medicine  Take your insulin and diabetes medicines as told.  Do not run out of insulin or medicines.  Adjust your insulin and diabetes medicines as told. Food   Make healthy food choices. These include: ? Chicken, fish, egg whites, and beans. ? Oats, whole wheat, bulgur, brown rice, quinoa, and millet. ? Fresh fruits and vegetables. ? Low-fat dairy products. ? Nuts, avocado, olive oil, and canola oil.  Make an eating plan. A food specialist (dietitian) can help you.  Follow instructions from your doctor about what you cannot eat or drink.  Drink enough fluid to keep your pee (urine) clear or pale yellow.  Eat healthy snacks between healthy meals.  Keep track of carbs you eat. Read food labels. Learn about food serving sizes.  Follow your sick day plan when you cannot eat or drink normally. Make this plan with your doctor. Activity  Exercise 30 minutes or more a day or as much as told by your doctor.  Talk with your doctor if you start a new exercise. Your doctor may need to adjust your insulin, medicines, or food. Lifestyle  Do not drink alcohol.  Do not use any tobacco products. If you need help  quitting, ask your doctor.  Learn how to deal with stress. If you need help with this, ask your doctor. Body care   Stay up to date with your shots (immunizations).  Brush your teeth and gums two times a day. Floss at least one time a  day.  Go to the dentist least one time every 6 months.  Stay at a healthy weight while you are pregnant. General instructions  Take over-the-counter and prescription medicines only as told by your doctor.  Ask your doctor about risks of high blood pressure in pregnancy. These are called preeclampsia and eclampsia.  Share your diabetes care plan with: ? Your work or school. ? People you live with.  Check your pee for ketones: ? When you are sick. ? As told by your doctor.  Ask your doctor: ? Do I need to meet with a diabetes educator? ? Where can I find a support group for people with diabetes?  Carry a card or wear jewelry that says that you have diabetes.  Keep all follow-up visits with your doctor. This is important. Care after giving birth   Have your blood sugar checked 4-12 weeks after you give birth.  Get checked for diabetes at least every 3 years. Where to find more information: To learn more about diabetes, visit:  American Diabetes Association: www.diabetes.org/diabetes-basics/gestational  Centers for Disease Control and Prevention (CDC): http://sanchez-watson.com/.pdf  This information is not intended to replace advice given to you by your health care provider. Make sure you discuss any questions you have with your health care provider. Document Released: 06/09/2015 Document Revised: 07/24/2015 Document Reviewed: 03/21/2015 Elsevier Interactive Patient Education  [employer].

## 2017-03-07 NOTE — Progress Notes (Signed)
HIGH-RISK PREGNANCY VISIT Patient name: [name] MRN [id number]  Date of birth: 10/14/1985 Chief Complaint:   High Risk Gestation (having a lot pressure)  History of Present Illness:   [name] is a 32 y.o. G36P4004 female at [redacted]w[redacted]d with an Estimated Date of Delivery: 04/30/17 being seen today for ongoing management of a high-risk pregnancy complicated by A1DM.  Today she reports went to appt w/ nutritionist, got glucometer, but needs strips/lancets. Contractions: Not present.  .  Movement: Present. denies leaking of fluid.  Review of Systems:   Pertinent items are noted in HPI Denies abnormal vaginal discharge w/ itching/odor/irritation, headaches, visual changes, shortness of breath, chest pain, abdominal pain, severe nausea/vomiting, or problems with urination or bowel movements unless otherwise stated above. Pertinent History Reviewed:  Reviewed past medical,surgical, social, obstetrical and family history.  Reviewed problem list, medications and allergies. Physical Assessment:   Vitals:   03/07/17 1626  BP: 120/70  Pulse: 97  Weight: 230 lb 6.4 oz (104.5 kg)  Body mass index is 39.55 kg/m.           Physical Examination:   General appearance: alert, well appearing, and in no distress  Mental status: alert, oriented to person, place, and time  Skin: warm & dry   Extremities: Edema: None    Cardiovascular: normal heart rate noted  Respiratory: normal respiratory effort, no distress  Abdomen: gravid, soft, non-tender  Pelvic: Cervical exam deferred         Fetal Status: Fetal Heart Rate (bpm): 153 Fundal Height: 34 cm Movement: Present    Fetal Surveillance Testing today: FHT doppler   Results for orders placed or performed in visit on 03/07/17 (from the past 24 hour(s))  POCT urinalysis dipstick   Collection Time: 03/07/17  4:36 PM  Result Value Ref Range   Color, UA     Clarity, UA     Glucose, UA neg    Bilirubin, UA     Ketones, UA neg    Spec  Grav, UA  1.010 - 1.025   Blood, UA neg    pH, UA  5.0 - 8.0   Protein, UA trace    Urobilinogen, UA  0.2 or 1.0 E.U./dL   Nitrite, UA neg    Leukocytes, UA Negative Negative   Appearance     Odor      Assessment & Plan:  1) High-risk pregnancy G5P4004 at [redacted]w[redacted]d with an Estimated Date of Delivery: 04/30/17   2) A1DM, not checking sugars yet, needs strips/lancets, so both called into [facility] [place] [facility] for QID testing w/ prn refills for Accu-check Guide. Discussed importance of strict glycemic control and adherence to low carb diet during pregnancy as well as potential complications from uncontrolled diabetes during pregnancy. Bring log to each appt.   3) Excessive weight gain, 45lb to date of recommended no more than 20lb entire pregnancy based on pre-pregnancy BMI. Advised no further weight gain, even ok to start losing weight d/t change in diet w/ DM  Meds: No orders of the defined types were placed in this encounter.  Labs/procedures today: none  Treatment Plan:  Growth u/s @  36-38wks     Deliver @ 40wks   Reviewed: Preterm labor symptoms and general obstetric precautions including but not limited to vaginal bleeding, contractions, leaking of fluid and fetal movement were reviewed in detail with the patient.  Recommended Tdap at HD/PCP per CDC guidelines. All questions were answered.  Follow-up: Return in about 1 week (  around 03/14/2017) for HROB. to see how sugars are doing  Orders Placed This Encounter  Procedures  . POCT urinalysis dipstick   Marge DuncansBooker, Raziah Funnell Randall CNM, [facility]NP-BC 03/07/2017 5:06 PM

## 2017-03-09 ENCOUNTER — Encounter: Payer: Medicaid Other | Attending: Obstetrics and Gynecology | Admitting: Nutrition

## 2017-03-09 VITALS — Ht 64.0 in | Wt 231.0 lb

## 2017-03-09 DIAGNOSIS — O24410 Gestational diabetes mellitus in pregnancy, diet controlled: Secondary | ICD-10-CM

## 2017-03-09 DIAGNOSIS — Z3A00 Weeks of gestation of pregnancy not specified: Secondary | ICD-10-CM | POA: Diagnosis not present

## 2017-03-09 DIAGNOSIS — Z713 Dietary counseling and surveillance: Secondary | ICD-10-CM | POA: Diagnosis present

## 2017-03-09 DIAGNOSIS — E669 Obesity, unspecified: Secondary | ICD-10-CM

## 2017-03-09 DIAGNOSIS — O24419 Gestational diabetes mellitus in pregnancy, unspecified control: Secondary | ICD-10-CM | POA: Diagnosis not present

## 2017-03-09 NOTE — Progress Notes (Signed)
Medical Nutrition Therapy:  Appt start time: 1445 end time:  1500  Assessment:  Primary concerns today. Gestational Diabetes. [redacted] weeks gestation. Due April 30, 2017.    Wt down 2 lbs from cutting out junk and eating less fast foods. BS before breakfast 89 mg/dl, 2 hrs after meals; range is 96-129 mg/dl. She is doing well. Only 2 BS of 129 mg/dl and she admits it was something she ate too much of with that meal. Engaged and working eating healthier and better balanced meals. Has cut out sweets, juices and junk food. She is working on walking more.  1 hr BS 185 mg/dl  FBS 87 mg/dl, 2 hr 595138 mg/dl  Wt Readings from Last 3 Encounters:  03/09/17 241 lb (109.3 kg)  03/07/17 230 lb 6.4 oz (104.5 kg)  03/02/17 233 lb (105.7 kg)   Ht Readings from Last 3 Encounters:  03/09/17 5\' 4"  (1.626 m)  03/02/17 5\' 4"  (1.626 m)  09/20/16 5\' 6"  (1.676 m)   CMP Latest Ref Rng & Units 09/20/2016 04/10/2015 08/09/2014  Glucose 65 - 99 mg/dL 99 93 99  BUN 6 - 20 mg/dL 7 7 7   Creatinine 0.44 - 1.00 mg/dL 6.380.60 7.560.66 4.330.70  Sodium 135 - 145 mmol/L 134(L) 142 143  Potassium 3.5 - 5.1 mmol/L 3.8 4.6 3.4(L)  Chloride 101 - 111 mmol/L 102 102 106  CO2 22 - 32 mmol/L 25 25 -  Calcium 8.9 - 10.3 mg/dL 2.9(J8.7(L) 9.0 -  Total Protein 6.5 - 8.1 g/dL 6.7 6.6 -  Total Bilirubin 0.3 - 1.2 mg/dL 0.7 0.5 -  Alkaline Phos 38 - 126 U/L 60 60 -  AST 15 - 41 U/L 16 13 -  ALT 14 - 54 U/L 21 11 -    Preferred Learning Style:     No preference indicated   Learning Readiness:   Ready  Change in progress   MEDICATIONS: See list   DIETARY INTAKE: B) 2 eggs, oatmeal - 3 TBSP and 1 slice wheat toast. Water   Usual physical activity: ADL  Estimated energy needs: 1800  calories 200  g carbohydrates 135 g protein 50 g fat  Progress Towards Goal(s):  In progress.   Nutritional Diagnosis:  NB-1.1 Food and nutrition-related knowledge deficit As related to Gestational DM.  As evidenced by 1 hr BS > 160.     Intervention:  My Plate, portion sizes, meal planning, importance of drinking water, avoiding processed junk food, sweets, cakes, cookies and desserts. Encouraged to focus on more fresh vegetables, grilled meats and avoid junk food. Nutrition and Diabetes education provided on My Plate, CHO counting, meal planning, portion sizes, timing of meals, avoiding snacks between meals unless having a low blood sugar, target ranges for A1C and blood sugars, signs/symptoms and treatment of hyper/hypoglycemia, monitoring blood sugars, taking medications as prescribed, benefits of exercising 30 minutes per day and prevention of complications of DM.  Trained how to use the SunGardccucheck Guide meter, target goals for BS and importance of healthy eating for her and the baby.   Goals Eat 30-45 grams of carbs per meals 15 grams plus protein and veggies for snacks between meals Drink only water Walk 30 minutes a day Increase fresh fruits and vegetables No fruit or milk at breakfast. Keep 2 hr BS less than 120 mg/dl. Continue to test 4 times per day.   Teaching Method Utilized:  Visual Auditory Hands on  Handouts given during visit include:  The Plate Method  Gestational Diabetes Packet  Meal Plan Card  Barriers to learning/adherence to lifestyle change: none  Demonstrated degree of understanding via:  Teach Back   Monitoring/Evaluation:  Dietary intake, exercise, meal planning, and body weight in PRN.

## 2017-03-14 ENCOUNTER — Encounter: Payer: Self-pay | Admitting: Nutrition

## 2017-03-14 NOTE — Patient Instructions (Signed)
Goals Eat 30-45 grams of carbs per meals 15 grams plus protein and veggies for snacks between meals Drink only water Walk 30 minutes a day Increase fresh fruits and vegetables No fruit or milk at breakfast. Keep 2 hr BS less than 120 mg/dl. Continue to test 4 times per day.

## 2017-03-15 ENCOUNTER — Other Ambulatory Visit: Payer: Self-pay

## 2017-03-15 ENCOUNTER — Ambulatory Visit (INDEPENDENT_AMBULATORY_CARE_PROVIDER_SITE_OTHER): Payer: Medicaid Other | Admitting: Obstetrics & Gynecology

## 2017-03-15 ENCOUNTER — Encounter: Payer: Self-pay | Admitting: Obstetrics & Gynecology

## 2017-03-15 VITALS — BP 112/60 | HR 99 | Wt 232.0 lb

## 2017-03-15 DIAGNOSIS — O0993 Supervision of high risk pregnancy, unspecified, third trimester: Secondary | ICD-10-CM

## 2017-03-15 DIAGNOSIS — O24410 Gestational diabetes mellitus in pregnancy, diet controlled: Secondary | ICD-10-CM | POA: Diagnosis not present

## 2017-03-15 DIAGNOSIS — Z3A33 33 weeks gestation of pregnancy: Secondary | ICD-10-CM | POA: Diagnosis not present

## 2017-03-15 DIAGNOSIS — Z1389 Encounter for screening for other disorder: Secondary | ICD-10-CM

## 2017-03-15 DIAGNOSIS — Z331 Pregnant state, incidental: Secondary | ICD-10-CM | POA: Diagnosis not present

## 2017-03-15 LAB — POCT URINALYSIS DIPSTICK
Blood, UA: NEGATIVE
Glucose, UA: NEGATIVE
KETONES UA: NEGATIVE
Leukocytes, UA: NEGATIVE
Nitrite, UA: NEGATIVE
Protein, UA: NEGATIVE

## 2017-03-15 NOTE — Progress Notes (Signed)
   HIGH-RISK PREGNANCY VISIT Patient name: [name] Humphries MRN [id number]  Date of birth: 06/04/1985 Chief Complaint:   High Risk Gestation  History of Present Illness:   [name] Debo is a 32 y.o. 715P4004 female at 4745w3d with an Estimated Date of Delivery: 04/30/17 being seen today for ongoing management of a high-risk pregnancy complicated by gestational diabetes. CBG are ok am fastings are creeping up Today she reports no complaints. Contractions: Not present. Vag. Bleeding: None.  Movement: Present. denies leaking of fluid.  Review of Systems:   Pertinent items are noted in HPI Denies abnormal vaginal discharge w/ itching/odor/irritation, headaches, visual changes, shortness of breath, chest pain, abdominal pain, severe nausea/vomiting, or problems with urination or bowel movements unless otherwise stated above. Pertinent History Reviewed:  Reviewed past medical,surgical, social, obstetrical and family history.  Reviewed problem list, medications and allergies. Physical Assessment:   Vitals:   03/15/17 1624  BP: 112/60  Pulse: 99  Weight: 232 lb (105.2 kg)  Body mass index is 39.82 kg/m.           Physical Examination:   General appearance: alert, well appearing, and in no distress  Mental status: alert, oriented to person, place, and time  Skin: warm & dry   Extremities: Edema: None    Cardiovascular: normal heart rate noted  Respiratory: normal respiratory effort, no distress  Abdomen: gravid, soft, non-tender  Pelvic: Cervical exam deferred         Fetal Status: Fetal Heart Rate (bpm): 146 Fundal Height: 36 cm Movement: Present    Fetal Surveillance Testing today: FHR 146   Results for orders placed or performed in visit on 03/15/17 (from the past 24 hour(s))  POCT urinalysis dipstick   Collection Time: 03/15/17  4:26 PM  Result Value Ref Range   Color, UA     Clarity, UA     Glucose, UA neg    Bilirubin, UA     Ketones, UA neg    Spec Grav, UA  1.010 -  1.025   Blood, UA neg    pH, UA  5.0 - 8.0   Protein, UA neg    Urobilinogen, UA  0.2 or 1.0 E.U./dL   Nitrite, UA neg    Leukocytes, UA Negative Negative   Appearance     Odor      Assessment & Plan:  1) High-risk pregnancy G5P4004 at 5745w3d with an Estimated Date of Delivery: 04/30/17   2) Class A1 DM, stable, fastings are creeping up    Meds: No orders of the defined types were placed in this encounter.   Labs/procedures today: none  Treatment Plan:  Close surveillance of her fasting CBG, be seen next week recheck EFW 36+ weeks  Reviewed: Preterm labor symptoms and general obstetric precautions including but not limited to vaginal bleeding, contractions, leaking of fluid and fetal movement were reviewed in detail with the patient.  All questions were answered.  Follow-up: Return in about 1 week (around 03/22/2017) for HROB, with Dr Despina HiddenEure.  Orders Placed This Encounter  Procedures  . POCT urinalysis dipstick   Lazaro ArmsLuther H Novah Goza  03/15/2017 4:35 PM

## 2017-03-22 ENCOUNTER — Ambulatory Visit (INDEPENDENT_AMBULATORY_CARE_PROVIDER_SITE_OTHER): Payer: Medicaid Other | Admitting: Women's Health

## 2017-03-22 ENCOUNTER — Encounter: Payer: Self-pay | Admitting: Women's Health

## 2017-03-22 ENCOUNTER — Other Ambulatory Visit: Payer: Self-pay

## 2017-03-22 VITALS — BP 114/70 | HR 92 | Wt 231.0 lb

## 2017-03-22 DIAGNOSIS — B009 Herpesviral infection, unspecified: Secondary | ICD-10-CM

## 2017-03-22 DIAGNOSIS — R768 Other specified abnormal immunological findings in serum: Secondary | ICD-10-CM | POA: Diagnosis not present

## 2017-03-22 DIAGNOSIS — O24415 Gestational diabetes mellitus in pregnancy, controlled by oral hypoglycemic drugs: Secondary | ICD-10-CM | POA: Diagnosis not present

## 2017-03-22 DIAGNOSIS — Z3A34 34 weeks gestation of pregnancy: Secondary | ICD-10-CM | POA: Diagnosis not present

## 2017-03-22 DIAGNOSIS — O98513 Other viral diseases complicating pregnancy, third trimester: Secondary | ICD-10-CM

## 2017-03-22 DIAGNOSIS — O0993 Supervision of high risk pregnancy, unspecified, third trimester: Secondary | ICD-10-CM

## 2017-03-22 DIAGNOSIS — O98813 Other maternal infectious and parasitic diseases complicating pregnancy, third trimester: Secondary | ICD-10-CM

## 2017-03-22 DIAGNOSIS — Z1389 Encounter for screening for other disorder: Secondary | ICD-10-CM

## 2017-03-22 DIAGNOSIS — B373 Candidiasis of vulva and vagina: Secondary | ICD-10-CM | POA: Diagnosis not present

## 2017-03-22 DIAGNOSIS — O24419 Gestational diabetes mellitus in pregnancy, unspecified control: Secondary | ICD-10-CM

## 2017-03-22 DIAGNOSIS — Z331 Pregnant state, incidental: Secondary | ICD-10-CM

## 2017-03-22 LAB — POCT URINALYSIS DIPSTICK
GLUCOSE UA: NEGATIVE
Nitrite, UA: NEGATIVE
Protein, UA: NEGATIVE
RBC UA: NEGATIVE

## 2017-03-22 MED ORDER — METFORMIN HCL 500 MG PO TABS: 500.0000 mg | ORAL_TABLET | Freq: Two times a day (BID) | ORAL | 3 refills | Status: DC

## 2017-03-22 MED ORDER — ACYCLOVIR 400 MG PO TABS: 400.0000 mg | ORAL_TABLET | Freq: Three times a day (TID) | ORAL | 3 refills | Status: DC

## 2017-03-22 NOTE — Patient Instructions (Addendum)
[name], I greatly value your feedback.  If you receive a survey following your visit with us today, we appreciate you taking the time to fill it out.  Thanks, Joellyn HaffKim Rashana Andrew, CNM, WHNP-BC  You can try a 7 night over the counter yeast cream such as Monistat 7. It does not need to be brand name, generic is fine, but please make sure it is a 7 night cream.     Call the office [phone]) or [facility] if:  You begin to have strong, frequent contractions  Your water breaks.  Sometimes it is a big gush of fluid, sometimes it is just a trickle that keeps getting your panties wet or running down your legs  You have vaginal bleeding.  It is normal to have a small amount of spotting if your cervix was checked.   You don't feel your baby moving like normal.  If you don't, get you something to eat and drink and lay down and focus on feeling your baby move.  You should feel at least 10 movements in 2 hours.  If you don't, you should call the office or [facility].     Preterm Labor and Birth Information The normal length of a pregnancy is 39-41 weeks. Preterm labor is when labor starts before 37 completed weeks of pregnancy. What are the risk factors for preterm labor? Preterm labor is more likely to occur in women who:  Have certain infections during pregnancy such as a bladder infection, sexually transmitted infection, or infection inside the uterus (chorioamnionitis).  Have a shorter-than-normal cervix.  Have gone into preterm labor before.  Have had surgery on their cervix.  Are younger than age 33 or older than age 835.  Are African American.  Are pregnant with twins or multiple babies (multiple gestation).  Take street drugs or smoke while pregnant.  Do not gain enough weight while pregnant.  Became pregnant shortly after having been pregnant.  What are the symptoms of preterm labor? Symptoms of preterm labor include:  Cramps similar to those that  can happen during a menstrual period. The cramps may happen with diarrhea.  Pain in the abdomen or lower back.  Regular uterine contractions that may feel like tightening of the abdomen.  A feeling of increased pressure in the pelvis.  Increased watery or bloody mucus discharge from the vagina.  Water breaking (ruptured amniotic sac).  Why is it important to recognize signs of preterm labor? It is important to recognize signs of preterm labor because babies who are born prematurely may not be fully developed. This can put them at an increased risk for:  Long-term (chronic) heart and lung problems.  Difficulty immediately after birth with regulating body systems, including blood sugar, body temperature, heart rate, and breathing rate.  Bleeding in the brain.  Cerebral palsy.  Learning difficulties.  Death.  These risks are highest for babies who are born before 34 weeks of pregnancy. How is preterm labor treated? Treatment depends on the length of your pregnancy, your condition, and the health of your baby. It may involve:  Having a stitch (suture) placed in your cervix to prevent your cervix from opening too early (cerclage).  Taking or being given medicines, such as: ? Hormone medicines. These may be given early in pregnancy to help support the pregnancy. ? Medicine to stop contractions. ? Medicines to help mature the baby's lungs. These may be prescribed if the risk of delivery is high. ? Medicines  to prevent your baby from developing cerebral palsy.  If the labor happens before 34 weeks of pregnancy, you may need to stay in the hospital. What should I do if I think I am in preterm labor? If you think that you are going into preterm labor, call your health care provider right away. How can I prevent preterm labor in future pregnancies? To increase your chance of having a full-term pregnancy:  Do not use any tobacco products, such as cigarettes, chewing tobacco, and  e-cigarettes. If you need help quitting, ask your health care provider.  Do not use street drugs or medicines that have not been prescribed to you during your pregnancy.  Talk with your health care provider before taking any herbal supplements, even if you have been taking them regularly.  Make sure you gain a healthy amount of weight during your pregnancy.  Watch for infection. If you think that you might have an infection, get it checked right away.  Make sure to tell your health care provider if you have gone into preterm labor before.  This information is not intended to replace advice given to you by your health care provider. Make sure you discuss any questions you have with your health care provider. Document Released: 05/08/2003 Document Revised: 07/29/2015 Document Reviewed: 07/09/2015 Elsevier Interactive Patient Education  2018 [employer].

## 2017-03-22 NOTE — Progress Notes (Signed)
HIGH-RISK PREGNANCY VISIT Patient name: [name] MRN [id number]  Date of birth: 1985/08/03 Chief Complaint:   Routine Prenatal Visit (c/o vaginal itching no d/c)  History of Present Illness:   [name] is a 32 y.o. G72P4004 female at [redacted]w[redacted]d with an Estimated Date of Delivery: 04/30/17 being seen today for ongoing management of a high-risk pregnancy complicated by A2DM (meds added today).  Today she reports fbs 87-101 (~ 1/2 >90), 2hr pp 85-158 (more than 1/2 >120). Reports vulvovaginal itching x 1 day, no odor.  Contractions: Not present. Vag. Bleeding: None.  Movement: Present. denies leaking of fluid.  Review of Systems:   Pertinent items are noted in HPI Denies abnormal vaginal discharge w/ itching/odor/irritation, headaches, visual changes, shortness of breath, chest pain, abdominal pain, severe nausea/vomiting, or problems with urination or bowel movements unless otherwise stated above. Pertinent History Reviewed:  Reviewed past medical,surgical, social, obstetrical and family history.  Reviewed problem list, medications and allergies. Physical Assessment:   Vitals:   03/22/17 1622  BP: 114/70  Pulse: 92  Weight: 231 lb (104.8 kg)  Body mass index is 39.65 kg/m.           Physical Examination:   General appearance: alert, well appearing, and in no distress  Mental status: alert, oriented to person, place, and time  Skin: warm & dry   Extremities: Edema: None    Cardiovascular: normal heart rate noted  Respiratory: normal respiratory effort, no distress  Abdomen: gravid, soft, non-tender  Pelvic: spec exam: cx visually closed, mod amt thick clumpy white d/c c/w yeast          Fetal Status: Fetal Heart Rate (bpm): 140 Fundal Height: 36 cm Movement: Present    Fetal Surveillance Testing today: FHT doppler   Results for orders placed or performed in visit on 03/22/17 (from the past 24 hour(s))  POCT urinalysis dipstick   Collection Time: 03/22/17  4:23 PM    Result Value Ref Range   Color, UA     Clarity, UA     Glucose, UA neg    Bilirubin, UA     Ketones, UA mod    Spec Grav, UA  1.010 - 1.025   Blood, UA neg    pH, UA  5.0 - 8.0   Protein, UA neg    Urobilinogen, UA  0.2 or 1.0 E.U./dL   Nitrite, UA neg    Leukocytes, UA Moderate (2+) (A) Negative   Appearance     Odor      Assessment & Plan:  1) High-risk pregnancy G5P4004 at [redacted]w[redacted]d with an Estimated Date of Delivery: 04/30/17   2) A2DM, meds added today, rx metformin 500mg  BID  3) HSV2, rx acyclovir for suppression  4) Vulvovaginal candida> otc monistat 7, no sex while taking  Meds:  Meds ordered this encounter  Medications  . acyclovir (ZOVIRAX) 400 MG tablet    Sig: Take 1 tablet (400 mg total) by mouth 3 (three) times daily.    Dispense:  90 tablet    Refill:  3    Order Specific Question:   Supervising Provider    Answer:   Despina Hidden, LUTHER H [[id number]]  . metFORMIN (GLUCOPHAGE) 500 MG tablet    Sig: Take 1 tablet (500 mg total) by mouth 2 (two) times daily with a meal.    Dispense:  60 tablet    Refill:  3    Order Specific Question:   Supervising Provider    Answer:  EURE, LUTHER H [[id number]]    Labs/procedures today: wet prep  Treatment Plan:  Growth u/s @ 36-38wks     2x/wk testing     Deliver @ 39wks  Reviewed: Preterm labor symptoms and general obstetric precautions including but not limited to vaginal bleeding, contractions, leaking of fluid and fetal movement were reviewed in detail with the patient.  All questions were answered.  Follow-up: Return for Friday for HROB/NST, then Tues/Fri HROB/NST x 4wks ( with efw/afi/bpp u/s on 2/5 (instead of nst).  Orders Placed This Encounter  Procedures  . POCT urinalysis dipstick   Marge DuncansBooker, Roger Kettles Randall CNM, [facility]NP-BC 03/22/2017 5:06 PM

## 2017-03-25 ENCOUNTER — Ambulatory Visit (INDEPENDENT_AMBULATORY_CARE_PROVIDER_SITE_OTHER): Payer: Medicaid Other | Admitting: Obstetrics and Gynecology

## 2017-03-25 ENCOUNTER — Encounter: Payer: Self-pay | Admitting: Obstetrics and Gynecology

## 2017-03-25 VITALS — BP 110/60 | HR 87 | Wt 227.6 lb

## 2017-03-25 DIAGNOSIS — Z331 Pregnant state, incidental: Secondary | ICD-10-CM

## 2017-03-25 DIAGNOSIS — Z3A34 34 weeks gestation of pregnancy: Secondary | ICD-10-CM | POA: Diagnosis not present

## 2017-03-25 DIAGNOSIS — O98513 Other viral diseases complicating pregnancy, third trimester: Secondary | ICD-10-CM

## 2017-03-25 DIAGNOSIS — O24415 Gestational diabetes mellitus in pregnancy, controlled by oral hypoglycemic drugs: Secondary | ICD-10-CM | POA: Diagnosis not present

## 2017-03-25 DIAGNOSIS — B009 Herpesviral infection, unspecified: Secondary | ICD-10-CM

## 2017-03-25 DIAGNOSIS — Z1389 Encounter for screening for other disorder: Secondary | ICD-10-CM

## 2017-03-25 DIAGNOSIS — O0990 Supervision of high risk pregnancy, unspecified, unspecified trimester: Secondary | ICD-10-CM

## 2017-03-25 LAB — POCT URINALYSIS DIPSTICK
Blood, UA: NEGATIVE
Glucose, UA: NEGATIVE
NITRITE UA: NEGATIVE
PROTEIN UA: NEGATIVE

## 2017-03-25 NOTE — Patient Instructions (Signed)
(  [phone] is the phone number for Pregnancy Classes or hospital tours at [facility].  ° °You will be referred to  [email or web address]   for more information on childbirth classes   °At this site you may register for classes. You may sign up for a waiting list if classes are full. Please SIGN UP FOR THIS!.   When the waiting list becomes long, sometimes new classes can be added. ° ° ° °

## 2017-03-25 NOTE — Progress Notes (Signed)
HIGH-RISK PREGNANCY VISIT Patient name: [name] MRN [id number]  Date of birth: [birth year] Chief Complaint:   High Risk Gestation (NST)  History of Present Illness:   [name] is a 32 y.o. G23P4004 female at [redacted]w[redacted]d with an Estimated Date of Delivery: 04/30/17 being seen today for ongoing management of a high-risk pregnancy complicated by gestational DM, class  A2 DM.  Today she reports no complaints. She began her medications on 01/23. All FBS are 90 and below. Almost all her after-meal blood sugars are below 100, with only 2 above 100.    Contractions: Not present. Vag. Bleeding: None.  Movement: Present. denies leaking of fluid.  Review of Systems:   Pertinent items are noted in HPI Denies abnormal vaginal discharge w/ itching/odor/irritation, headaches, visual changes, shortness of breath, chest pain, abdominal pain, severe nausea/vomiting, or problems with urination or bowel movements unless otherwise stated above. Pertinent History Reviewed:  Reviewed past medical,surgical, social, obstetrical and family history.  Reviewed problem list, medications and allergies. Physical Assessment:   Vitals:   03/25/17 1238  BP: 110/60  Pulse: 87  Weight: 227 lb 9.6 oz (103.2 kg)  Body mass index is 39.07 kg/m.           Physical Examination:   General appearance: alert, well appearing, and in no distress, oriented to person, place, and time and normal appearing weight  Mental status: alert, oriented to person, place, and time, normal mood, behavior, speech, dress, motor activity, and thought processes  Skin: warm & dry   Extremities: Edema: None    Cardiovascular: normal heart rate noted  Respiratory: normal respiratory effort, no distress  Abdomen: gravid, soft, non-tender  Pelvic: Cervical exam deferred         Fetal Status: Fetal Heart Rate (bpm): 135 Fundal Height: 36 cm Movement: Present    Fetal Surveillance Testing today: NST - reactive, accelerations to 160  bpm  Results for orders placed or performed in visit on 03/25/17 (from the past 24 hour(s))  POCT urinalysis dipstick   Collection Time: 03/25/17 12:38 PM  Result Value Ref Range   Color, UA     Clarity, UA     Glucose, UA neg    Bilirubin, UA     Ketones, UA 2+    Spec Grav, UA  1.010 - 1.025   Blood, UA neg    pH, UA  5.0 - 8.0   Protein, UA neg    Urobilinogen, UA  0.2 or 1.0 E.U./dL   Nitrite, UA neg    Leukocytes, UA Moderate (2+) (A) Negative   Appearance     Odor      Assessment & Plan:  1) High-risk pregnancy G5P4004 at [redacted]w[redacted]d with an Estimated Date of Delivery: 04/30/17   2) A2DM, taking metformin 500mg  BID, stable, well controlled  3) HSV2, taking acyclovir for suppression   Meds: Metformin 500mg  BID  Labs/procedures today: NST - reactive  Treatment Plan: Growth u/s @ 36-38wks     2x/wk testing     Deliver @ 39wks  Reviewed: Preterm labor symptoms and general obstetric precautions including but not limited to vaginal bleeding, contractions, leaking of fluid and fetal movement were reviewed in detail with the patient.  All questions were answered.  Follow-up: Return in about 4 days (around 03/29/2017), or if symptoms worsen or fail to improve, for HROB, NST. Tues/Fri HROB/NST x 4wks ( with efw/afi/bpp u/s on 2/5 (instead of nst)).    Orders Placed This Encounter  Procedures  .  POCT urinalysis dipstick     By signing my name below, I, Izna Ahmed, attest that this documentation has been prepared under the direction and in the presence of Tilda BurrowFerguson, Dann Ventress V, MD. Electronically Signed: Redge GainerIzna Ahmed, Medical Scribe. 03/25/17. 1:15 PM.  I personally performed the services described in this documentation, which was SCRIBED in my presence. The recorded information has been reviewed and considered accurate. It has been edited as necessary during review. Tilda BurrowJohn V Alajia Schmelzer, MD

## 2017-03-29 ENCOUNTER — Ambulatory Visit (INDEPENDENT_AMBULATORY_CARE_PROVIDER_SITE_OTHER): Payer: Medicaid Other | Admitting: Obstetrics & Gynecology

## 2017-03-29 ENCOUNTER — Encounter: Payer: Self-pay | Admitting: Obstetrics & Gynecology

## 2017-03-29 VITALS — BP 124/60 | HR 88 | Wt 228.0 lb

## 2017-03-29 DIAGNOSIS — Z1389 Encounter for screening for other disorder: Secondary | ICD-10-CM

## 2017-03-29 DIAGNOSIS — O24415 Gestational diabetes mellitus in pregnancy, controlled by oral hypoglycemic drugs: Secondary | ICD-10-CM

## 2017-03-29 DIAGNOSIS — Z331 Pregnant state, incidental: Secondary | ICD-10-CM | POA: Diagnosis not present

## 2017-03-29 DIAGNOSIS — Z3A35 35 weeks gestation of pregnancy: Secondary | ICD-10-CM | POA: Diagnosis not present

## 2017-03-29 DIAGNOSIS — O24419 Gestational diabetes mellitus in pregnancy, unspecified control: Secondary | ICD-10-CM

## 2017-03-29 DIAGNOSIS — O0990 Supervision of high risk pregnancy, unspecified, unspecified trimester: Secondary | ICD-10-CM

## 2017-03-29 LAB — POCT URINALYSIS DIPSTICK
GLUCOSE UA: NEGATIVE
Ketones, UA: NEGATIVE
NITRITE UA: NEGATIVE

## 2017-03-29 NOTE — Progress Notes (Signed)
   HIGH-RISK PREGNANCY VISIT Patient name: [name] MRN [id number]  Date of birth: 06/14/1985 Chief Complaint:   High Risk Gestation (NST)  History of Present Illness:   [name] is a 32 y.o. 535P4004 female at 239w3d with an Estimated Date of Delivery: 04/30/17 being seen today for ongoing management of a high-risk pregnancy complicated by class  A2 DM.  Today she reports no complaints. Contractions: Not present. Vag. Bleeding: None.  Movement: Present. denies leaking of fluid.  Review of Systems:   Pertinent items are noted in HPI Denies abnormal vaginal discharge w/ itching/odor/irritation, headaches, visual changes, shortness of breath, chest pain, abdominal pain, severe nausea/vomiting, or problems with urination or bowel movements unless otherwise stated above. Pertinent History Reviewed:  Reviewed past medical,surgical, social, obstetrical and family history.  Reviewed problem list, medications and allergies. Physical Assessment:   Vitals:   03/29/17 1405  BP: 124/60  Pulse: 88  Weight: 228 lb (103.4 kg)  Body mass index is 39.14 kg/m.           Physical Examination:   General appearance: alert, well appearing, and in no distress  Mental status: alert, oriented to person, place, and time  Skin: warm & dry   Extremities: Edema: None    Cardiovascular: normal heart rate noted  Respiratory: normal respiratory effort, no distress  Abdomen: gravid, soft, non-tender  Pelvic: Cervical exam deferred         Fetal Status: Fetal Heart Rate (bpm): 140 Fundal Height: 37 cm Movement: Present    Fetal Surveillance Testing today: NST reactive   Results for orders placed or performed in visit on 03/29/17 (from the past 24 hour(s))  POCT urinalysis dipstick   Collection Time: 03/29/17  2:05 PM  Result Value Ref Range   Color, UA     Clarity, UA     Glucose, UA neg    Bilirubin, UA     Ketones, UA neg    Spec Grav, UA  1.010 - 1.025   Blood, UA trace    pH, UA   5.0 - 8.0   Protein, UA 1+    Urobilinogen, UA  0.2 or 1.0 E.U./dL   Nitrite, UA neg    Leukocytes, UA Moderate (2+) (A) Negative   Appearance     Odor      Assessment & Plan:  1) High-risk pregnancy G5P4004 at 1019w3d with an Estimated Date of Delivery: 04/30/17   2) Class A2 DM, stable, metformin 500 BID with excellent CBG, sonogram for EFW 1 week, already scheduled    Meds: No orders of the defined types were placed in this encounter.   Labs/procedures today: NST  Treatment Plan:  Twice weekly surveillance IOL 39 wks or as indicated, EFW next week  Reviewed: Term labor symptoms and general obstetric precautions including but not limited to vaginal bleeding, contractions, leaking of fluid and fetal movement were reviewed in detail with the patient.  All questions were answered.  Follow-up: Return in about 3 days (around 04/01/2017) for NST, HROB, sonogram EFW, HROB 04/05/2017.  Orders Placed This Encounter  Procedures  . US OB Follow Up  . US Fetal BPP W/O Non Stress  . POCT urinalysis dipstick   Lazaro ArmsLuther H Eure  03/29/2017 3:17 PM

## 2017-04-01 ENCOUNTER — Other Ambulatory Visit: Payer: Medicaid Other | Admitting: Obstetrics and Gynecology

## 2017-04-04 ENCOUNTER — Other Ambulatory Visit: Payer: Self-pay

## 2017-04-04 ENCOUNTER — Ambulatory Visit (INDEPENDENT_AMBULATORY_CARE_PROVIDER_SITE_OTHER): Payer: Medicaid Other

## 2017-04-04 ENCOUNTER — Ambulatory Visit (INDEPENDENT_AMBULATORY_CARE_PROVIDER_SITE_OTHER): Payer: Medicaid Other | Admitting: Women's Health

## 2017-04-04 ENCOUNTER — Encounter: Payer: Self-pay | Admitting: Women's Health

## 2017-04-04 VITALS — BP 118/72 | HR 92 | Wt 227.0 lb

## 2017-04-04 DIAGNOSIS — O24415 Gestational diabetes mellitus in pregnancy, controlled by oral hypoglycemic drugs: Secondary | ICD-10-CM

## 2017-04-04 DIAGNOSIS — O24419 Gestational diabetes mellitus in pregnancy, unspecified control: Secondary | ICD-10-CM

## 2017-04-04 DIAGNOSIS — Z3A36 36 weeks gestation of pregnancy: Secondary | ICD-10-CM

## 2017-04-04 DIAGNOSIS — Z331 Pregnant state, incidental: Secondary | ICD-10-CM | POA: Diagnosis not present

## 2017-04-04 DIAGNOSIS — O0993 Supervision of high risk pregnancy, unspecified, third trimester: Secondary | ICD-10-CM

## 2017-04-04 DIAGNOSIS — Z1389 Encounter for screening for other disorder: Secondary | ICD-10-CM

## 2017-04-04 DIAGNOSIS — O0990 Supervision of high risk pregnancy, unspecified, unspecified trimester: Secondary | ICD-10-CM

## 2017-04-04 LAB — POCT URINALYSIS DIPSTICK
Blood, UA: NEGATIVE
Glucose, UA: NEGATIVE
Ketones, UA: NEGATIVE
Nitrite, UA: NEGATIVE
PROTEIN UA: NEGATIVE

## 2017-04-04 NOTE — Progress Notes (Signed)
US 36+2 wks,cephalic,BPP 8/8,AFI 13 cm,cx 3.7 cm,anterior pl gr 3,fhr 146 bpm,EFW 3269 g 76%,AC 98%

## 2017-04-04 NOTE — Progress Notes (Signed)
   HIGH-RISK PREGNANCY VISIT Patient name: [name] Selle MRN [id number]  Date of birth: August 12, 1985 Chief Complaint:   High Risk Gestation (U/S today)  History of Present Illness:   [name] Gfeller is a 32 y.o. 345P4004 female at 4315w2d with an Estimated Date of Delivery: 04/30/17 being seen today for ongoing management of a high-risk pregnancy complicated by A2DM on metformin 500mg  BID.  Today she reports fbs all normal, only 1 2hr pp > 120 (124). Contractions: Not present. Vag. Bleeding: None.  Movement: Present. denies leaking of fluid.  Review of Systems:   Pertinent items are noted in HPI Denies abnormal vaginal discharge w/ itching/odor/irritation, headaches, visual changes, shortness of breath, chest pain, abdominal pain, severe nausea/vomiting, or problems with urination or bowel movements unless otherwise stated above. Pertinent History Reviewed:  Reviewed past medical,surgical, social, obstetrical and family history.  Reviewed problem list, medications and allergies. Physical Assessment:   Vitals:   04/04/17 1459  BP: 118/72  Pulse: 92  Weight: 227 lb (103 kg)  Body mass index is 38.96 kg/m.           Physical Examination:   General appearance: alert, well appearing, and in no distress  Mental status: alert, oriented to person, place, and time  Skin: warm & dry   Extremities: Edema: None    Cardiovascular: normal heart rate noted  Respiratory: normal respiratory effort, no distress  Abdomen: gravid, soft, non-tender  Pelvic: Cervical exam deferred         Fetal Status: Fetal Heart Rate (bpm): 146 u/s Fundal Height: 37 cm Movement: Present    Fetal Surveillance Testing today: US 36+2 wks,cephalic,BPP 8/8,AFI 13 cm,cx 3.7 cm,anterior pl gr 3,fhr 146 bpm,EFW 3269 g 76%,AC 98%  Results for orders placed or performed in visit on 04/04/17 (from the past 24 hour(s))  POCT urinalysis dipstick   Collection Time: 04/04/17  2:58 PM  Result Value Ref Range   Color, UA     Clarity, UA     Glucose, UA neg    Bilirubin, UA     Ketones, UA neg    Spec Grav, UA  1.010 - 1.025   Blood, UA neg    pH, UA  5.0 - 8.0   Protein, UA neg    Urobilinogen, UA  0.2 or 1.0 E.U./dL   Nitrite, UA neg    Leukocytes, UA Moderate (2+) (A) Negative   Appearance     Odor      Assessment & Plan:  1) High-risk pregnancy G5P4004 at 2815w2d with an Estimated Date of Delivery: 04/30/17   2) A2DM, stable, continue metformin 500mg  BID, EFW 75% w/ AC 98% today   Meds: No orders of the defined types were placed in this encounter.   Labs/procedures today: u/s  Treatment Plan:  2x/wk testing     Deliver @ 39wks  Reviewed: Preterm labor symptoms and general obstetric precautions including but not limited to vaginal bleeding, contractions, leaking of fluid and fetal movement were reviewed in detail with the patient.  All questions were answered.  Follow-up: Return for Thurs for hrob/nst; schedule mon/thurs hrob/nst x 3wks (last 2/21).  Orders Placed This Encounter  Procedures  . POCT urinalysis dipstick   Marge DuncansBooker, Dick Hark Randall CNM, [facility]NP-BC 04/04/2017 4:53 PM

## 2017-04-04 NOTE — Patient Instructions (Signed)
[name], I greatly value your feedback.  If you receive a survey following your visit with us today, we appreciate you taking the time to fill it out.  Thanks, Joellyn HaffKim Ayah Cozzolino, CNM, WHNP-BC   Call the office [phone]) or go to [facility] if:  You begin to have strong, frequent contractions  Your water breaks.  Sometimes it is a big gush of fluid, sometimes it is just a trickle that keeps getting your panties wet or running down your legs  You have vaginal bleeding.  It is normal to have a small amount of spotting if your cervix was checked.   You don't feel your baby moving like normal.  If you don't, get you something to eat and drink and lay down and focus on feeling your baby move.  You should feel at least 10 movements in 2 hours.  If you don't, you should call the office or go to [facility].     Preterm Labor and Birth Information The normal length of a pregnancy is 39-41 weeks. Preterm labor is when labor starts before 37 completed weeks of pregnancy. What are the risk factors for preterm labor? Preterm labor is more likely to occur in women who:  Have certain infections during pregnancy such as a bladder infection, sexually transmitted infection, or infection inside the uterus (chorioamnionitis).  Have a shorter-than-normal cervix.  Have gone into preterm labor before.  Have had surgery on their cervix.  Are younger than age 33 or older than age 33.  Are African American.  Are pregnant with twins or multiple babies (multiple gestation).  Take street drugs or smoke while pregnant.  Do not gain enough weight while pregnant.  Became pregnant shortly after having been pregnant.  What are the symptoms of preterm labor? Symptoms of preterm labor include:  Cramps similar to those that can happen during a menstrual period. The cramps may happen with diarrhea.  Pain in the abdomen or lower back.  Regular uterine contractions that may feel like  tightening of the abdomen.  A feeling of increased pressure in the pelvis.  Increased watery or bloody mucus discharge from the vagina.  Water breaking (ruptured amniotic sac).  Why is it important to recognize signs of preterm labor? It is important to recognize signs of preterm labor because babies who are born prematurely may not be fully developed. This can put them at an increased risk for:  Long-term (chronic) heart and lung problems.  Difficulty immediately after birth with regulating body systems, including blood sugar, body temperature, heart rate, and breathing rate.  Bleeding in the brain.  Cerebral palsy.  Learning difficulties.  Death.  These risks are highest for babies who are born before 34 weeks of pregnancy. How is preterm labor treated? Treatment depends on the length of your pregnancy, your condition, and the health of your baby. It may involve:  Having a stitch (suture) placed in your cervix to prevent your cervix from opening too early (cerclage).  Taking or being given medicines, such as: ? Hormone medicines. These may be given early in pregnancy to help support the pregnancy. ? Medicine to stop contractions. ? Medicines to help mature the baby's lungs. These may be prescribed if the risk of delivery is high. ? Medicines to prevent your baby from developing cerebral palsy.  If the labor happens before 34 weeks of pregnancy, you may need to stay in the hospital. What should I do if I think I am in preterm labor? If  you think that you are going into preterm labor, call your health care provider right away. How can I prevent preterm labor in future pregnancies? To increase your chance of having a full-term pregnancy:  Do not use any tobacco products, such as cigarettes, chewing tobacco, and e-cigarettes. If you need help quitting, ask your health care provider.  Do not use street drugs or medicines that have not been prescribed to you during your  pregnancy.  Talk with your health care provider before taking any herbal supplements, even if you have been taking them regularly.  Make sure you gain a healthy amount of weight during your pregnancy.  Watch for infection. If you think that you might have an infection, get it checked right away.  Make sure to tell your health care provider if you have gone into preterm labor before.  This information is not intended to replace advice given to you by your health care provider. Make sure you discuss any questions you have with your health care provider. Document Released: 05/08/2003 Document Revised: 07/29/2015 Document Reviewed: 07/09/2015 Elsevier Interactive Patient Education  2018 [employer].

## 2017-04-07 ENCOUNTER — Ambulatory Visit (INDEPENDENT_AMBULATORY_CARE_PROVIDER_SITE_OTHER): Payer: Medicaid Other | Admitting: Women's Health

## 2017-04-07 ENCOUNTER — Encounter: Payer: Self-pay | Admitting: Women's Health

## 2017-04-07 VITALS — BP 110/60 | HR 91 | Wt 226.8 lb

## 2017-04-07 DIAGNOSIS — Z3A36 36 weeks gestation of pregnancy: Secondary | ICD-10-CM

## 2017-04-07 DIAGNOSIS — O0993 Supervision of high risk pregnancy, unspecified, third trimester: Secondary | ICD-10-CM

## 2017-04-07 DIAGNOSIS — Z331 Pregnant state, incidental: Secondary | ICD-10-CM

## 2017-04-07 DIAGNOSIS — Z1389 Encounter for screening for other disorder: Secondary | ICD-10-CM | POA: Diagnosis not present

## 2017-04-07 DIAGNOSIS — N898 Other specified noninflammatory disorders of vagina: Secondary | ICD-10-CM | POA: Diagnosis not present

## 2017-04-07 DIAGNOSIS — O24419 Gestational diabetes mellitus in pregnancy, unspecified control: Secondary | ICD-10-CM

## 2017-04-07 DIAGNOSIS — O24415 Gestational diabetes mellitus in pregnancy, controlled by oral hypoglycemic drugs: Secondary | ICD-10-CM

## 2017-04-07 DIAGNOSIS — Z3483 Encounter for supervision of other normal pregnancy, third trimester: Secondary | ICD-10-CM

## 2017-04-07 DIAGNOSIS — N9089 Other specified noninflammatory disorders of vulva and perineum: Secondary | ICD-10-CM

## 2017-04-07 LAB — POCT URINALYSIS DIPSTICK
Blood, UA: NEGATIVE
Glucose, UA: NEGATIVE
KETONES UA: NEGATIVE
Nitrite, UA: NEGATIVE

## 2017-04-07 LAB — POCT WET PREP (WET MOUNT)
CLUE CELLS WET PREP WHIFF POC: NEGATIVE
TRICHOMONAS WET PREP HPF POC: ABSENT

## 2017-04-07 MED ORDER — TRIAMCINOLONE ACETONIDE 0.1 % EX OINT: 1.0000 "application " | TOPICAL_OINTMENT | Freq: Two times a day (BID) | CUTANEOUS | 0 refills | Status: DC

## 2017-04-07 MED ORDER — NYSTATIN 100000 UNIT/GM EX OINT: 1.0000 "application " | TOPICAL_OINTMENT | Freq: Two times a day (BID) | CUTANEOUS | 0 refills | Status: DC

## 2017-04-07 NOTE — Progress Notes (Signed)
HIGH-RISK PREGNANCY VISIT Patient name: Sonia BrinkShavonne J Moulton MRN [id number]  Date of birth: [birth year] Chief Complaint:   High Risk Gestation (GBS- GC-CHL/ think yeast infection not gone/ NST)  History of Present Illness:   [name] is a 32 y.o. 135P4004 female at 7950w5d with an Estimated Date of Delivery: 04/30/17 being seen today for ongoing management of a high-risk pregnancy complicated by A2DM on metformin 500mg  BID.  Today she reports forgot log, reports all fbs <95, all 2hr pp <120 but one (126). Reports vulvovaginal irritation, thinks yeast infection is back. No itching, odor.  Contractions: Not present.  .  Movement: Present. denies leaking of fluid.  Review of Systems:   Pertinent items are noted in HPI Denies abnormal vaginal discharge w/ itching/odor/irritation, headaches, visual changes, shortness of breath, chest pain, abdominal pain, severe nausea/vomiting, or problems with urination or bowel movements unless otherwise stated above. Pertinent History Reviewed:  Reviewed past medical,surgical, social, obstetrical and family history.  Reviewed problem list, medications and allergies. Physical Assessment:   Vitals:   04/07/17 1400  BP: 110/60  Pulse: 91  Weight: 226 lb 12.8 oz (102.9 kg)  Body mass index is 38.93 kg/m.           Physical Examination:   General appearance: alert, well appearing, and in no distress  Mental status: alert, oriented to person, place, and time  Skin: warm & dry   Extremities: Edema: None    Cardiovascular: normal heart rate noted  Respiratory: normal respiratory effort, no distress  Abdomen: gravid, soft, non-tender  Pelvic: Cervical exam performed  Dilation: 3 Effacement (%): Thick Station: -2 spec exam: small amt white nonodorous d/c  Fetal Status: Fetal Heart Rate (bpm): 150 Fundal Height: 37 cm Movement: Present Presentation: Vertex  Fetal Surveillance Testing today: NST: FHR baseline 150 bpm, Variability: moderate,  Accelerations:present, Decelerations:  Absent= Cat 1/Reactive Toco: irregular     Results for orders placed or performed in visit on 04/07/17 (from the past 24 hour(s))  POCT urinalysis dipstick   Collection Time: 04/07/17  2:07 PM  Result Value Ref Range   Color, UA     Clarity, UA     Glucose, UA neg    Bilirubin, UA     Ketones, UA neg    Spec Grav, UA  1.010 - 1.025   Blood, UA neg    pH, UA  5.0 - 8.0   Protein, UA trace    Urobilinogen, UA  0.2 or 1.0 E.U./dL   Nitrite, UA neg    Leukocytes, UA Moderate (2+) (A) Negative   Appearance     Odor    POCT Wet Prep Mellody Drown(Wet [place])   Collection Time: 04/07/17  2:39 PM  Result Value Ref Range   Source Wet Prep POC vaginal    WBC, Wet Prep HPF POC few    Bacteria Wet Prep HPF POC Few Few   BACTERIA WET PREP MORPHOLOGY POC     Clue Cells Wet Prep HPF POC None None   Clue Cells Wet Prep Whiff POC Negative Whiff    Yeast Wet Prep HPF POC None    KOH Wet Prep POC     Trichomonas Wet Prep HPF POC Absent Absent    Assessment & Plan:  1) High-risk pregnancy G5P4004 at 2150w5d with an Estimated Date of Delivery: 04/30/17   2) A2DM, stable, continue metformin 500mg  BID  3) Vulvovaginal irritation, neg wet prep, Rx mycolog as 2 separate ointments (triamcinolone 0.1% ointment and nystatin  100,000 units ointment)   Meds:  Meds ordered this encounter  Medications  . nystatin ointment (MYCOSTATIN)    Sig: Apply 1 application topically 2 (two) times daily.    Dispense:  30 g    Refill:  0    Order Specific Question:   Supervising Provider    Answer:   Despina Hidden, LUTHER H [[id number]]  . triamcinolone ointment (KENALOG) 0.1 %    Sig: Apply 1 application topically 2 (two) times daily.    Dispense:  30 g    Refill:  0    Order Specific Question:   Supervising Provider    Answer:   Duane Lope H [[id number]]    Labs/procedures today: gbs, gc/ct, sve, wet prep  Treatment Plan:    2x/wk testing @ 32wks or weekly BPP    Deliver @ 39wks  Reviewed:  Preterm labor symptoms and general obstetric precautions including but not limited to vaginal bleeding, contractions, leaking of fluid and fetal movement were reviewed in detail with the patient.  All questions were answered.  Follow-up: Return for As scheduled Mon for hrob/nst.  Orders Placed This Encounter  Procedures  . Strep Gp B NAA  . GC/Chlamydia Probe Amp  . POCT urinalysis dipstick  . POCT [employer] Prep [facility])   Marge Duncans CNM, [facility] 04/07/2017 2:39 PM

## 2017-04-07 NOTE — Patient Instructions (Signed)
[name], I greatly value your feedback.  If you receive a survey following your visit with us today, we appreciate you taking the time to fill it out.  Thanks, Joellyn HaffKim Leo Fray, CNM, WHNP-BC   Call the office [phone]) or go to [facility] if:  You begin to have strong, frequent contractions  Your water breaks.  Sometimes it is a big gush of fluid, sometimes it is just a trickle that keeps getting your panties wet or running down your legs  You have vaginal bleeding.  It is normal to have a small amount of spotting if your cervix was checked.   You don't feel your baby moving like normal.  If you don't, get you something to eat and drink and lay down and focus on feeling your baby move.  You should feel at least 10 movements in 2 hours.  If you don't, you should call the office or go to [facility].     [facility] Hicks Contractions Contractions of the uterus can occur throughout pregnancy, but they are not always a sign that you are in labor. You may have practice contractions called Braxton Hicks contractions. These false labor contractions are sometimes confused with true labor. What are Deberah PeltonBraxton Hicks contractions? Braxton Hicks contractions are tightening movements that occur in the muscles of the uterus before labor. Unlike true labor contractions, these contractions do not result in opening (dilation) and thinning of the cervix. Toward the end of pregnancy (32-34 weeks), Braxton Hicks contractions can happen more often and may become stronger. These contractions are sometimes difficult to tell apart from true labor because they can be very uncomfortable. You should not feel embarrassed if you go to the hospital with false labor. Sometimes, the only way to tell if you are in true labor is for your health care provider to look for changes in the cervix. The health care provider will do a physical exam and may monitor your contractions. If you are not in true labor, the exam should  show that your cervix is not dilating and your water has not broken. If there are other health problems associated with your pregnancy, it is completely safe for you to be sent home with false labor. You may continue to have Braxton Hicks contractions until you go into true labor. How to tell the difference between true labor and false labor True labor  Contractions last 30-70 seconds.  Contractions become very regular.  Discomfort is usually felt in the top of the uterus, and it spreads to the lower abdomen and low back.  Contractions do not go away with walking.  Contractions usually become more intense and increase in frequency.  The cervix dilates and gets thinner. False labor  Contractions are usually shorter and not as strong as true labor contractions.  Contractions are usually irregular.  Contractions are often felt in the front of the lower abdomen and in the groin.  Contractions may go away when you walk around or change positions while lying down.  Contractions get weaker and are shorter-lasting as time goes on.  The cervix usually does not dilate or become thin. Follow these instructions at home:  Take over-the-counter and prescription medicines only as told by your health care provider.  Keep up with your usual exercises and follow other instructions from your health care provider.  Eat and drink lightly if you think you are going into labor.  If Braxton Hicks contractions are making you uncomfortable: ? Change your position from lying  down or resting to walking, or change from walking to resting. ? Sit and rest in a tub of warm water. ? Drink enough fluid to keep your urine pale yellow. Dehydration may cause these contractions. ? Do slow and deep breathing several times an hour.  Keep all follow-up prenatal visits as told by your health care provider. This is important. Contact a health care provider if:  You have a fever.  You have continuous pain in  your abdomen. Get help right away if:  Your contractions become stronger, more regular, and closer together.  You have fluid leaking or gushing from your vagina.  You pass blood-tinged mucus (bloody show).  You have bleeding from your vagina.  You have low back pain that you never had before.  You feel your baby's head pushing down and causing pelvic pressure.  Your baby is not moving inside you as much as it used to. Summary  Contractions that occur before labor are called Braxton Hicks contractions, false labor, or practice contractions.  Braxton Hicks contractions are usually shorter, weaker, farther apart, and less regular than true labor contractions. True labor contractions usually become progressively stronger and regular and they become more frequent.  Manage discomfort from [facility] contractions by changing position, resting in a warm bath, drinking plenty of water, or practicing deep breathing. This information is not intended to replace advice given to you by your health care provider. Make sure you discuss any questions you have with your health care provider. Document Released: 07/01/2016 Document Revised: 07/01/2016 Document Reviewed: 07/01/2016 Elsevier Interactive Patient Education  2018 [employer].

## 2017-04-09 LAB — STREP GP B NAA: Strep Gp B NAA: POSITIVE — AB

## 2017-04-09 LAB — GC/CHLAMYDIA PROBE AMP
Chlamydia trachomatis, NAA: NEGATIVE
Neisseria gonorrhoeae by PCR: NEGATIVE

## 2017-04-11 ENCOUNTER — Encounter: Payer: Self-pay | Admitting: Obstetrics and Gynecology

## 2017-04-11 ENCOUNTER — Ambulatory Visit (INDEPENDENT_AMBULATORY_CARE_PROVIDER_SITE_OTHER): Payer: Medicaid Other | Admitting: Obstetrics and Gynecology

## 2017-04-11 VITALS — BP 110/70 | HR 94 | Wt 227.4 lb

## 2017-04-11 DIAGNOSIS — Z1389 Encounter for screening for other disorder: Secondary | ICD-10-CM | POA: Diagnosis not present

## 2017-04-11 DIAGNOSIS — O0993 Supervision of high risk pregnancy, unspecified, third trimester: Secondary | ICD-10-CM

## 2017-04-11 DIAGNOSIS — O24415 Gestational diabetes mellitus in pregnancy, controlled by oral hypoglycemic drugs: Secondary | ICD-10-CM | POA: Diagnosis not present

## 2017-04-11 DIAGNOSIS — Z331 Pregnant state, incidental: Secondary | ICD-10-CM | POA: Diagnosis not present

## 2017-04-11 DIAGNOSIS — Z3A37 37 weeks gestation of pregnancy: Secondary | ICD-10-CM

## 2017-04-11 LAB — POCT URINALYSIS DIPSTICK
Blood, UA: NEGATIVE
GLUCOSE UA: NEGATIVE
KETONES UA: NEGATIVE
Nitrite, UA: NEGATIVE

## 2017-04-11 NOTE — Progress Notes (Signed)
Patient ID: MONEE DEMBECK, female   DOB: 02/19/1986, 32 y.o.   MRN: [id number]   [facility] PREGNANCY VISIT Patient name: EMERLYN MEHLHOFF MRN [id number]  Date of birth: 02-Dec-1985 Chief Complaint:   High Risk Gestation (NST today/ room # 8)  History of Present Illness:   OKEMA ROLLINSON is a 32 y.o. G80P4004 female at [redacted]w[redacted]d with an Estimated Date of Delivery: 04/30/17 being seen today for ongoing management of a high-risk pregnancy complicated by A2DM, metformin 500mg  BID.  Today she reports no complaints. She has been monitoring her blood sugars and they have been normal. Contractions: Irregular.  .  Movement: Present. denies leaking of fluid.  Review of Systems:   Pertinent items are noted in HPI Denies abnormal vaginal discharge w/ itching/odor/irritation, headaches, visual changes, shortness of breath, chest pain, abdominal pain, severe nausea/vomiting, or problems with urination or bowel movements unless otherwise stated above. Pertinent History Reviewed:  Reviewed past medical,surgical, social, obstetrical and family history.  Reviewed problem list, medications and allergies. Physical Assessment:   Vitals:   04/11/17 1455  BP: 110/70  Pulse: 94  Weight: 227 lb 6.4 oz (103.1 kg)  Body mass index is 39.03 kg/m.           Physical Examination:   General appearance: alert, well appearing, and in no distress and oriented to person, place, and time  Mental status: alert, oriented to person, place, and time, normal mood, behavior, speech, dress, motor activity, and thought processes, affect appropriate to mood  Skin: warm & dry   Extremities: Edema: None    Cardiovascular: normal heart rate noted  Respiratory: normal respiratory effort, no distress  Abdomen: gravid, soft, non-tender  Pelvic: Cervical exam deferred         Fetal Status: Fetal Heart Rate (bpm): 140-160us Fundal Height: 41 cm Movement: Present    Fetal Surveillance Testing today: NST   Results for orders  placed or performed in visit on 04/11/17 (from the past 24 hour(s))  POCT urinalysis dipstick   Collection Time: 04/11/17  2:59 PM  Result Value Ref Range   Color, UA     Clarity, UA     Glucose, UA neg    Bilirubin, UA     Ketones, UA neg    Spec Grav, UA  1.010 - 1.025   Blood, UA neg    pH, UA  5.0 - 8.0   Protein, UA trace    Urobilinogen, UA  0.2 or 1.0 E.U./dL   Nitrite, UA neg    Leukocytes, UA Moderate (2+) (A) Negative   Appearance     Odor      Assessment & Plan:  1) High-risk pregnancy G5P4004 at [redacted]w[redacted]d with an Estimated Date of Delivery: 04/30/17   2) A2DM, stable, metformin 500 mg BID  Meds: No orders of the defined types were placed in this encounter.  Labs/procedures today: NST  Treatment Plan:  2 x week testing, deliver at 39 weeks  Reviewed: Term labor symptoms and general obstetric precautions including but not limited to vaginal bleeding, contractions, leaking of fluid and fetal movement were reviewed in detail with the patient.  All questions were answered.  Follow-up: Return in about 3 days (around 04/14/2017) for HROB, NST.  Orders Placed This Encounter  Procedures  . POCT urinalysis dipstick   By signing my name below, I, Diona Browner, attest that this documentation has been prepared under the direction and in the presence of Tilda Burrow, MD. Electronically Signed: Diona Browner,  Medical Scribe. 04/11/17. 3:27 PM.  I personally performed the services described in this documentation, which was SCRIBED in my presence. The recorded information has been reviewed and considered accurate. It has been edited as necessary during review. Tilda BurrowJohn V Brecken Walth, MD

## 2017-04-14 ENCOUNTER — Encounter: Payer: Self-pay | Admitting: Obstetrics & Gynecology

## 2017-04-14 ENCOUNTER — Ambulatory Visit (INDEPENDENT_AMBULATORY_CARE_PROVIDER_SITE_OTHER): Payer: Medicaid Other | Admitting: Obstetrics & Gynecology

## 2017-04-14 ENCOUNTER — Other Ambulatory Visit: Payer: Self-pay

## 2017-04-14 VITALS — BP 114/70 | HR 95 | Wt 226.0 lb

## 2017-04-14 DIAGNOSIS — O0993 Supervision of high risk pregnancy, unspecified, third trimester: Secondary | ICD-10-CM

## 2017-04-14 DIAGNOSIS — Z1389 Encounter for screening for other disorder: Secondary | ICD-10-CM

## 2017-04-14 DIAGNOSIS — Z331 Pregnant state, incidental: Secondary | ICD-10-CM

## 2017-04-14 DIAGNOSIS — O24419 Gestational diabetes mellitus in pregnancy, unspecified control: Secondary | ICD-10-CM | POA: Diagnosis not present

## 2017-04-14 DIAGNOSIS — Z3A37 37 weeks gestation of pregnancy: Secondary | ICD-10-CM | POA: Diagnosis not present

## 2017-04-14 LAB — POCT URINALYSIS DIPSTICK
Blood, UA: NEGATIVE
Glucose, UA: NEGATIVE
KETONES UA: NEGATIVE
NITRITE UA: NEGATIVE

## 2017-04-14 NOTE — Progress Notes (Signed)
   HIGH-RISK PREGNANCY VISIT Patient name: Sonia BrinkShavonne J Holub MRN [id number]  Date of birth: [birth year] Chief Complaint:   High Risk Gestation (NSt; c/o yellow d/c with odor)  History of Present Illness:   [name] is a 32 y.o. 785P4004 female at 5792w5d with an Estimated Date of Delivery: 04/30/17 being seen today for ongoing management of a high-risk pregnancy complicated by class  A2 DM.  Today she reports no complaints. Contractions: Irregular. Vag. Bleeding: None.  Movement: Present. denies leaking of fluid.  Review of Systems:   Pertinent items are noted in HPI Denies abnormal vaginal discharge w/ itching/odor/irritation, headaches, visual changes, shortness of breath, chest pain, abdominal pain, severe nausea/vomiting, or problems with urination or bowel movements unless otherwise stated above. Pertinent History Reviewed:  Reviewed past medical,surgical, social, obstetrical and family history.  Reviewed problem list, medications and allergies. Physical Assessment:   Vitals:   04/14/17 1513  BP: 114/70  Pulse: 95  Weight: 226 lb (102.5 kg)  Body mass index is 38.79 kg/m.           Physical Examination:   General appearance: alert, well appearing, and in no distress  Mental status: alert, oriented to person, place, and time  Skin: warm & dry   Extremities: Edema: None    Cardiovascular: normal heart rate noted  Respiratory: normal respiratory effort, no distress  Abdomen: gravid, soft, non-tender  Pelvic: Cervical exam deferred         Fetal Status:     Movement: Present    Fetal Surveillance Testing today: reactive NST   Results for orders placed or performed in visit on 04/14/17 (from the past 24 hour(s))  POCT urinalysis dipstick   Collection Time: 04/14/17  3:16 PM  Result Value Ref Range   Color, UA     Clarity, UA     Glucose, UA neg    Bilirubin, UA     Ketones, UA neg    Spec Grav, UA  1.010 - 1.025   Blood, UA neg    pH, UA  5.0 - 8.0   Protein, UA  trace    Urobilinogen, UA  0.2 or 1.0 E.U./dL   Nitrite, UA neg    Leukocytes, UA Moderate (2+) (A) Negative   Appearance     Odor      Assessment & Plan:  1) High-risk pregnancy G5P4004 at 3692w5d with an Estimated Date of Delivery: 04/30/17   2) Class A2 DM, stable, goo CBG  3) ,   Meds: No orders of the defined types were placed in this encounter.   Labs/procedures today: Reactive NST  Treatment Plan:  Twice weekly NST, indcution 04/23/2017  Reviewed: Term labor symptoms and general obstetric precautions including but not limited to vaginal bleeding, contractions, leaking of fluid and fetal movement were reviewed in detail with the patient.  All questions were answered.  Follow-up: Return in about 5 days (around 04/19/2017) for NST, HROB.  Orders Placed This Encounter  Procedures  . POCT urinalysis dipstick   Lazaro ArmsLuther H   04/14/2017 4:27 PM

## 2017-04-18 ENCOUNTER — Other Ambulatory Visit: Payer: Medicaid Other | Admitting: Obstetrics & Gynecology

## 2017-04-20 ENCOUNTER — Inpatient Hospital Stay (HOSPITAL_COMMUNITY)
Admission: AD | Admit: 2017-04-20 | Discharge: 2017-04-23 | DRG: 806 | Disposition: A | Discharge status: Home / Self Care | Payer: Medicaid Other | Source: Ambulatory Visit | Attending: Obstetrics & Gynecology | Admitting: Obstetrics & Gynecology

## 2017-04-20 ENCOUNTER — Encounter (HOSPITAL_COMMUNITY): Payer: Self-pay

## 2017-04-20 DIAGNOSIS — O9832 Other infections with a predominantly sexual mode of transmission complicating childbirth: Secondary | ICD-10-CM | POA: Diagnosis present

## 2017-04-20 DIAGNOSIS — O99824 Streptococcus B carrier state complicating childbirth: Secondary | ICD-10-CM | POA: Diagnosis present

## 2017-04-20 DIAGNOSIS — Z87891 Personal history of nicotine dependence: Secondary | ICD-10-CM | POA: Diagnosis not present

## 2017-04-20 DIAGNOSIS — O24425 Gestational diabetes mellitus in childbirth, controlled by oral hypoglycemic drugs: Principal | ICD-10-CM | POA: Diagnosis present

## 2017-04-20 DIAGNOSIS — A6000 Herpesviral infection of urogenital system, unspecified: Secondary | ICD-10-CM | POA: Diagnosis present

## 2017-04-20 DIAGNOSIS — Z3A38 38 weeks gestation of pregnancy: Secondary | ICD-10-CM | POA: Diagnosis not present

## 2017-04-20 DIAGNOSIS — R768 Other specified abnormal immunological findings in serum: Secondary | ICD-10-CM | POA: Diagnosis present

## 2017-04-20 DIAGNOSIS — Z8632 Personal history of gestational diabetes: Secondary | ICD-10-CM

## 2017-04-20 DIAGNOSIS — O0990 Supervision of high risk pregnancy, unspecified, unspecified trimester: Secondary | ICD-10-CM

## 2017-04-20 DIAGNOSIS — Z3483 Encounter for supervision of other normal pregnancy, third trimester: Secondary | ICD-10-CM | POA: Diagnosis present

## 2017-04-20 DIAGNOSIS — R7689 Other specified abnormal immunological findings in serum: Secondary | ICD-10-CM | POA: Diagnosis present

## 2017-04-20 LAB — TYPE AND SCREEN
ABO/RH(D): A POS
Antibody Screen: NEGATIVE

## 2017-04-20 LAB — ABO/RH: ABO/RH(D): A POS

## 2017-04-20 LAB — CBC
HCT: 38.7 % (ref 36.0–46.0)
Hemoglobin: 12.8 g/dL (ref 12.0–15.0)
MCH: 29.4 pg (ref 26.0–34.0)
MCHC: 33.1 g/dL (ref 30.0–36.0)
MCV: 89 fL (ref 78.0–100.0)
Platelets: 208 10*3/uL (ref 150–400)
RBC: 4.35 MIL/uL (ref 3.87–5.11)
RDW: 14.8 % (ref 11.5–15.5)
WBC: 10.2 10*3/uL (ref 4.0–10.5)

## 2017-04-20 LAB — POCT FERN TEST: POCT Fern Test: POSITIVE

## 2017-04-20 LAB — GLUCOSE, CAPILLARY
GLUCOSE-CAPILLARY: 84 mg/dL (ref 65–99)
Glucose-Capillary: 59 mg/dL — ABNORMAL LOW (ref 65–99)
Glucose-Capillary: 84 mg/dL (ref 65–99)
Glucose-Capillary: 66 mg/dL (ref 65–99)

## 2017-04-20 MED ORDER — DIPHENHYDRAMINE HCL 50 MG/ML IJ SOLN: 12.5000 mg | INTRAMUSCULAR | Status: DC | PRN

## 2017-04-20 MED ORDER — SODIUM CHLORIDE 0.9 % IV SOLN
2.0000 g | Freq: Once | INTRAVENOUS | Status: AC
  Administered 2017-04-20: 2 g via INTRAVENOUS
  Filled 2017-04-20: qty 2000

## 2017-04-20 MED ORDER — LACTATED RINGERS IV SOLN
500.0000 mL | INTRAVENOUS | Status: DC | PRN
Start: 2017-04-20 — End: 2017-04-21

## 2017-04-20 MED ORDER — OXYCODONE-ACETAMINOPHEN 5-325 MG PO TABS: 1.0000 | ORAL_TABLET | ORAL | Status: DC | PRN

## 2017-04-20 MED ORDER — OXYCODONE-ACETAMINOPHEN 5-325 MG PO TABS: 2.0000 | ORAL_TABLET | ORAL | Status: DC | PRN

## 2017-04-20 MED ORDER — OXYTOCIN 40 UNITS IN LACTATED RINGERS INFUSION - SIMPLE MED
1.0000 m[IU]/min | INTRAVENOUS | Status: DC
  Administered 2017-04-20: 2 m[IU]/min via INTRAVENOUS

## 2017-04-20 MED ORDER — TERBUTALINE SULFATE 1 MG/ML IJ SOLN
0.2500 mg | Freq: Once | INTRAMUSCULAR | Status: DC | PRN
  Filled 2017-04-20: qty 1

## 2017-04-20 MED ORDER — SOD CITRATE-CITRIC ACID 500-334 MG/5ML PO SOLN: 30.0000 mL | ORAL | Status: DC | PRN

## 2017-04-20 MED ORDER — LACTATED RINGERS IV SOLN
INTRAVENOUS | Status: DC
  Administered 2017-04-20 (×2): via INTRAVENOUS

## 2017-04-20 MED ORDER — LIDOCAINE HCL (PF) 1 % IJ SOLN
30.0000 mL | INTRAMUSCULAR | Status: DC | PRN
  Filled 2017-04-20: qty 30

## 2017-04-20 MED ORDER — LACTATED RINGERS IV SOLN: 500.0000 mL | Freq: Once | INTRAVENOUS | Status: DC

## 2017-04-20 MED ORDER — SODIUM CHLORIDE 0.9 % IV SOLN
2.0000 g | Freq: Once | INTRAVENOUS | Status: AC
  Administered 2017-04-20: 2 g via INTRAVENOUS
  Filled 2017-04-20: qty 2

## 2017-04-20 MED ORDER — PHENYLEPHRINE 40 MCG/ML (10ML) SYRINGE FOR IV PUSH (FOR BLOOD PRESSURE SUPPORT)
80.0000 ug | PREFILLED_SYRINGE | INTRAVENOUS | Status: DC | PRN
  Filled 2017-04-20: qty 5

## 2017-04-20 MED ORDER — FENTANYL CITRATE (PF) 100 MCG/2ML IJ SOLN
100.0000 ug | INTRAMUSCULAR | Status: DC | PRN
  Administered 2017-04-20 (×6): 100 ug via INTRAVENOUS
  Filled 2017-04-20 (×6): qty 2

## 2017-04-20 MED ORDER — ONDANSETRON HCL 4 MG/2ML IJ SOLN: 4.0000 mg | Freq: Four times a day (QID) | INTRAMUSCULAR | Status: DC | PRN

## 2017-04-20 MED ORDER — ACETAMINOPHEN 325 MG PO TABS
650.0000 mg | ORAL_TABLET | ORAL | Status: DC | PRN
  Administered 2017-04-20 (×2): 650 mg via ORAL
  Filled 2017-04-20 (×2): qty 2

## 2017-04-20 MED ORDER — EPHEDRINE 5 MG/ML INJ
10.0000 mg | INTRAVENOUS | Status: DC | PRN
  Filled 2017-04-20: qty 2

## 2017-04-20 MED ORDER — OXYTOCIN 40 UNITS IN LACTATED RINGERS INFUSION - SIMPLE MED
2.5000 [IU]/h | INTRAVENOUS | Status: DC
  Filled 2017-04-20 (×2): qty 1000

## 2017-04-20 MED ORDER — OXYTOCIN BOLUS FROM INFUSION
500.0000 mL | Freq: Once | INTRAVENOUS | Status: AC
  Administered 2017-04-21: 500 mL via INTRAVENOUS

## 2017-04-20 MED ORDER — FENTANYL 2.5 MCG/ML BUPIVACAINE 1/10 % EPIDURAL INFUSION (WH - ANES): 14.0000 mL/h | INTRAMUSCULAR | Status: DC | PRN

## 2017-04-20 NOTE — Anesthesia Pain Management Evaluation Note (Signed)
  CRNA Pain Management Visit Note  Patient: Glennis BrinkShavonne J Phung, 32 y.o., female  "Hello I am a member of the anesthesia team at [facility]. We have an anesthesia team available at all times to provide care throughout the hospital, including epidural management and anesthesia for C-section. I don't know your plan for the delivery whether it a natural birth, water birth, IV sedation, nitrous supplementation, doula or epidural, but we want to meet your pain goals."   1.Was your pain managed to your expectations on prior hospitalizations?   Yes   2.What is your expectation for pain management during this hospitalization?     IV pain meds  3.How can we help you reach that goal? Be available if needed  Record the patient's initial score and the patient's pain goal.   Pain: 6  Pain Goal: 8 [facility] wants you to be able to say your pain was always managed very well.  Deshaun Weisinger 04/20/2017

## 2017-04-20 NOTE — MAU Note (Signed)
Started leaking at 0900, fluid is yellow, still coming. No bleeding having irreg contractions.  Was 3 cm when last checked.

## 2017-04-20 NOTE — Progress Notes (Signed)
Patient ID: [name], female   DOB: December 10, 1985, 32 y.o.   MRN: [id number]  Feeling ctx some, but not really strong yet; Amp x 2 doses  BP 122/64, other VSS FHR 130s, +accels, no decels Ctx irreg 2-6 mins, Pit at 812mu/min Cx was 6/80/-2 per RN exam  IUP@term  SROM Latent labor GBS pos  Continue to increase Pit to achieve active labor Anticipate SVD  Cam HaiSHAW, Melika Reder CNM 04/20/2017 10:21 PM

## 2017-04-20 NOTE — H&P (Signed)
Obstetric History and Physical  [name] is a 32 y.o. [id number] with IUP at 3922w4d presenting for SOL/SROM. Patient states she has been having  irregular contractions, none vaginal bleeding, ruptured around 0900, light meconium membranes, with active fetal movement.  Prenatal history complicated by A2GDM. Patient has been taking her metformin 500mg  BID. CBGs have been controlled; fasting CBG this AM 74. 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  No blurry vision, headaches or peripheral edema, and RUQ pain.   Prenatal Course Source of Care: FT  with onset of care at 8 weeks Dating: By early US --->  Estimated Date of Delivery: 04/30/17 Pregnancy complications or risks: Patient Active Problem List   Diagnosis Date Noted  . Gestational diabetes mellitus, class A2 02/15/2017  . Supervision of high risk pregnancy, antepartum 09/21/2016  . Vitamin D deficiency 04/11/2015  . S/P cholecystectomy 06/19/2013  . 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 Hypokalemia 09/02/2012   She plans to bottle feed She desires Nexplanon for postpartum contraception.   Sono:    @[redacted]w[redacted]d , CWD, normal anatomy, cephalic presentation, anterior placenta, 3269g, 76% EFW, BPP 8/8, AFI wnl  Prenatal labs and studies: ABO, Rh: A/Positive/-- (07/24 1207) Antibody: Negative (12/17 0940) Rubella: 2.51 (07/24 1207) RPR: Non Reactive (12/17 0940)  HBsAg: Negative (07/24 1207)  HIV: Non Reactive (12/17 0940)  XBJ:YNWGNFAOGBS:Positive (02/07 1400) 2 hr Glucola abnormal Genetic screening normal Anatomy US normal  Prenatal Transfer Tool  Maternal Diabetes: Yes:  Diabetes Type:  Insulin/Medication controlled Genetic Screening: Normal Maternal Ultrasounds/Referrals: Normal Fetal Ultrasounds or other Referrals:  None Maternal Substance Abuse:  No Significant Maternal Medications:  Meds include: Other: Metformin and Acyclovir Significant Maternal Lab Results: Lab values include: Group B Strep positive  Past Medical History:   Diagnosis Date  . BV (bacterial vaginosis) 04/10/2015  . Depression   . HSV infection   . Irregular bleeding 01/17/2013  . Urinary frequency 04/10/2015  . Vaginal discharge 04/19/2013  . Vaginal odor 04/10/2015  . Vitamin D deficiency 04/11/2015  . Yeast infection 04/19/2013    Past Surgical History:  Procedure Laterality Date  . CHOLECYSTECTOMY N/A 09/05/2012   Procedure: LAPAROSCOPIC CHOLECYSTECTOMY;  Surgeon: Emelia LoronMatthew Wakefield, MD;  [facility];  Service: General;  Laterality: N/A;  . ERCP N/A 09/04/2012   Procedure: ENDOSCOPIC RETROGRADE CHOLANGIOPANCREATOGRAPHY (ERCP);  Surgeon: Rachael Feeaniel P Jacobs, MD;  [facility];  Service: Endoscopy;  Laterality: N/A;  . WISDOM TOOTH EXTRACTION      OB History  Gravida Para Term Preterm AB Living  5 4 4     4   SAB TAB Ectopic Multiple Live Births        0 4    # Outcome Date GA Lbr Len/2nd Weight Sex Delivery Anes PTL Lv  5 Current           4 Term 01/16/14 2639w5d 05:55 / 00:08 7 lb 5 oz (3.317 kg) F Vag-Spont None N LIV     Birth Comments: Neg Hep B, HIV, RPR, +GBS Observed for 48 hrs b/o GBS Hx of maternal THC use, but neg urine and meconiium drug screen on baby No jaundice, no feeding or respiratory problems  3 Term 11/26/09 6784w0d  8 lb (3.629 kg) M Vag-Spont None N LIV  2 Term 09/09/05 6285w0d  8 lb (3.629 kg) F Vag-Spont None N LIV  1 Term 04/08/04 8377w0d  8 lb (3.629 kg) M Vag-Spont None N LIV      Social History   Socioeconomic History  . Marital  status: Single    Spouse name: None  . Number of children: None  . Years of education: None  . Highest education level: None  Social Needs  . Financial resource strain: None  . Food insecurity - worry: None  . Food insecurity - inability: None  . Transportation needs - medical: None  . Transportation needs - non-medical: None  Occupational History  . None  Tobacco Use  . Smoking status: Former Smoker    Packs/day: 0.25    Years: 10.00    Pack years: 2.50    Types: Cigarettes     Last attempt to quit: 08/29/2012    Years since quitting: 4.6  . Smokeless tobacco: Never Used  Substance and Sexual Activity  . Alcohol use: No  . Drug use: No    Comment: pt denies  . Sexual activity: Yes    Birth control/protection: None  Other Topics Concern  . None  Social History Narrative  . None    Family History  Problem Relation Age of Onset  . CAD Maternal Grandmother   . Diabetes Maternal Grandmother   . Hypertension Mother   . Diabetes Mother   . Breast cancer Maternal Aunt   . Hypertension Maternal Grandfather   . Stroke Maternal Grandfather     Medications Prior to Admission  Medication Sig Dispense Refill Last Dose  . acetaminophen (TYLENOL) 500 MG tablet Take 1,000 mg by mouth every 6 (six) hours as needed.   Taking  . acyclovir (ZOVIRAX) 400 MG tablet Take 1 tablet (400 mg total) by mouth 3 (three) times daily. 90 tablet 3 Taking  . diphenhydrAMINE (BENADRYL) 25 MG tablet Take 25 mg by mouth every 6 (six) hours as needed.   Taking  . metFORMIN (GLUCOPHAGE) 500 MG tablet Take 1 tablet (500 mg total) by mouth 2 (two) times daily with a meal. 60 tablet 3 Taking  . nystatin ointment (MYCOSTATIN) Apply 1 application topically 2 (two) times daily. 30 g 0 Taking  . prenatal vitamin w/FE, FA (PRENATAL 1 + 1) 27-1 MG TABS tablet Take 1 tablet by mouth daily at 12 noon. 30 each 12 Taking  . triamcinolone ointment (KENALOG) 0.1 % Apply 1 application topically 2 (two) times daily. 30 g 0 Taking    No Known Allergies  Review of Systems: Negative except for what is mentioned in HPI.  Physical Exam: BP 126/85 (BP Location: Right Arm)   Pulse 86   Resp 16   Wt 227 lb (103 kg)   LMP 07/15/2016 (Approximate)   SpO2 97%   BMI 38.96 kg/m  CONSTITUTIONAL: Well-developed, well-nourished female in no acute distress.  HENT:  Normocephalic, atraumatic, External right and left ear normal. Oropharynx is clear and moist EYES: Conjunctivae and EOM are normal. Pupils are  equal, round, and reactive to light. No scleral icterus.  NECK: Normal range of motion, supple, no masses SKIN: Skin is warm and dry. No rash noted. Not diaphoretic. No erythema. No pallor. NEUROLOGIC: Alert and oriented to person, place, and time. Normal reflexes, muscle tone coordination. No cranial nerve deficit noted. PSYCHIATRIC: Normal mood and affect. Normal behavior. Normal judgment and thought content. CARDIOVASCULAR: Normal heart rate noted, regular rhythm RESPIRATORY: Effort and breath sounds normal, no problems with respiration noted ABDOMEN: Soft, nontender, nondistended, gravid. MUSCULOSKELETAL: Normal range of motion. No edema and no tenderness. 2+ distal pulses.  Cervical Exam: Dilation: 5 Effacement (%): 80 Cervical Position: Middle Station: -2 Presentation: Vertex Exam by:: Ginnie Smart RN  FHT:  Baseline rate 135 bpm   Variability moderate  Accelerations present   Decelerations none Contractions: Every 4 mins   Pertinent Labs/Studies:   Results for orders placed or performed during the hospital encounter of 04/20/17 (from the past 24 hour(s))  Fern Test     Status: Abnormal   Collection Time: 04/20/17 12:00 PM  Result Value Ref Range   POCT Fern Test Positive = ruptured amniotic membanes     Assessment : [name] is a 32 y.o. G5P4004 at 104w4d being admitted for labor.  Plan: Labor: Expectant management. Augmentation as needed.  Analgesia as needed. Orders placed. FWB: Reassuring fetal heart tracing.   GBS positive - history of rapid labor so will given Ampicillin Delivery plan: Hopeful for vaginal delivery   Caryl Ada, DO OB Fellow Faculty Practice, [facility] 04/20/2017, 11:53 AM

## 2017-04-21 ENCOUNTER — Encounter (HOSPITAL_COMMUNITY): Payer: Self-pay

## 2017-04-21 ENCOUNTER — Other Ambulatory Visit: Payer: Medicaid Other | Admitting: Advanced Practice Midwife

## 2017-04-21 DIAGNOSIS — O99824 Streptococcus B carrier state complicating childbirth: Secondary | ICD-10-CM

## 2017-04-21 DIAGNOSIS — O24419 Gestational diabetes mellitus in pregnancy, unspecified control: Secondary | ICD-10-CM

## 2017-04-21 DIAGNOSIS — Z3A38 38 weeks gestation of pregnancy: Secondary | ICD-10-CM

## 2017-04-21 LAB — RPR: RPR Ser Ql: NONREACTIVE

## 2017-04-21 MED ORDER — WITCH HAZEL-GLYCERIN EX PADS: 1.0000 "application " | MEDICATED_PAD | CUTANEOUS | Status: DC | PRN

## 2017-04-21 MED ORDER — IBUPROFEN 600 MG PO TABS
600.0000 mg | ORAL_TABLET | Freq: Four times a day (QID) | ORAL | Status: DC
  Administered 2017-04-21 – 2017-04-23 (×11): 600 mg via ORAL
  Filled 2017-04-21 (×11): qty 1

## 2017-04-21 MED ORDER — PRENATAL MULTIVITAMIN CH
1.0000 | ORAL_TABLET | Freq: Every day | ORAL | Status: DC
  Administered 2017-04-21 – 2017-04-23 (×3): 1 via ORAL
  Filled 2017-04-21 (×3): qty 1

## 2017-04-21 MED ORDER — DIBUCAINE 1 % RE OINT: 1.0000 "application " | TOPICAL_OINTMENT | RECTAL | Status: DC | PRN

## 2017-04-21 MED ORDER — BENZOCAINE-MENTHOL 20-0.5 % EX AERO
1.0000 "application " | INHALATION_SPRAY | CUTANEOUS | Status: DC | PRN
  Administered 2017-04-21: 1 via TOPICAL
  Filled 2017-04-21: qty 56

## 2017-04-21 MED ORDER — SENNOSIDES-DOCUSATE SODIUM 8.6-50 MG PO TABS
2.0000 | ORAL_TABLET | ORAL | Status: DC
  Administered 2017-04-22 – 2017-04-23 (×2): 2 via ORAL
  Filled 2017-04-21 (×2): qty 2

## 2017-04-21 MED ORDER — SIMETHICONE 80 MG PO CHEW: 80.0000 mg | CHEWABLE_TABLET | ORAL | Status: DC | PRN

## 2017-04-21 MED ORDER — TETANUS-DIPHTH-ACELL PERTUSSIS 5-2.5-18.5 LF-MCG/0.5 IM SUSP
0.5000 mL | Freq: Once | INTRAMUSCULAR | Status: AC
  Administered 2017-04-22: 0.5 mL via INTRAMUSCULAR
  Filled 2017-04-21: qty 0.5

## 2017-04-21 MED ORDER — ONDANSETRON HCL 4 MG PO TABS: 4.0000 mg | ORAL_TABLET | ORAL | Status: DC | PRN

## 2017-04-21 MED ORDER — ACETAMINOPHEN 325 MG PO TABS
650.0000 mg | ORAL_TABLET | ORAL | Status: DC | PRN
  Administered 2017-04-21 – 2017-04-22 (×7): 650 mg via ORAL
  Filled 2017-04-21 (×8): qty 2

## 2017-04-21 MED ORDER — ONDANSETRON HCL 4 MG/2ML IJ SOLN: 4.0000 mg | INTRAMUSCULAR | Status: DC | PRN

## 2017-04-21 MED ORDER — DIPHENHYDRAMINE HCL 25 MG PO CAPS: 25.0000 mg | ORAL_CAPSULE | Freq: Four times a day (QID) | ORAL | Status: DC | PRN

## 2017-04-21 MED ORDER — ZOLPIDEM TARTRATE 5 MG PO TABS: 5.0000 mg | ORAL_TABLET | Freq: Every evening | ORAL | Status: DC | PRN

## 2017-04-21 MED ORDER — COCONUT OIL OIL: 1.0000 "application " | TOPICAL_OIL | Status: DC | PRN

## 2017-04-21 MED ORDER — OXYCODONE HCL 5 MG PO TABS
5.0000 mg | ORAL_TABLET | ORAL | Status: DC | PRN
  Administered 2017-04-21 – 2017-04-23 (×11): 5 mg via ORAL
  Filled 2017-04-21 (×11): qty 1

## 2017-04-22 ENCOUNTER — Telehealth: Payer: Self-pay | Admitting: *Deleted

## 2017-04-22 LAB — BIRTH TISSUE RECOVERY COLLECTION (PLACENTA DONATION)

## 2017-04-22 NOTE — Telephone Encounter (Signed)
Called pt to schedule postpartum appointment but had to leave voicemail for past 2 days.  04-22-17  AS

## 2017-04-22 NOTE — Progress Notes (Signed)
Post Partum Day 1 Subjective: no complaints, up ad lib, voiding and tolerating PO, small lochia, plans to bottle feed, IUD  Objective: Blood pressure (!) 115/57, pulse 68, temperature 98.1 F (36.7 C), temperature source Oral, resp. rate 18, height 5\' 4"  (1.626 m), weight 103 kg (227 lb), last menstrual period 07/15/2016, SpO2 99 %, unknown if currently breastfeeding.  Physical Exam:  General: alert, cooperative and no distress Lochia:normal flow Chest: CTAB Heart: RRR no m/r/g Abdomen: +BS, soft, nontender,  Uterine Fundus: firm DVT Evaluation: No evidence of DVT seen on physical exam. Extremities: trace edema  Recent Labs    04/20/17 1210  HGB 12.8  HCT 38.7    Assessment/Plan: Plan for discharge tomorrow unless pediatrician lets them go home today.  GBS was adequately treated   LOS: 2 days   Jacklyn ShellFrances Cresenzo-Dishmon 04/22/2017, 7:12 AM

## 2017-04-22 NOTE — Progress Notes (Signed)
RN gave pt 5mg  of OXY-IR at 1208 for pain of a 9, then gave pt another 5mg  OXY-IR at 1308 within the 1 hour time frame. Realized pt only had order for 5mg  of OXY-IR, immediately called faculty practice physician to let them know about this, there were no adverse side effects.

## 2017-04-23 ENCOUNTER — Other Ambulatory Visit: Payer: Self-pay

## 2017-04-23 MED ORDER — IBUPROFEN 600 MG PO TABS: 600.0000 mg | ORAL_TABLET | Freq: Four times a day (QID) | ORAL | 0 refills | Status: DC | PRN

## 2017-04-23 NOTE — Discharge Summary (Signed)
OB Discharge Summary     Patient Name: [name] DOB: 1985-12-22 MRN: [id number]  Date of admission: 04/20/2017 Delivering MD: Cam Hai D   Date of discharge: 04/23/2017  Admitting diagnosis: 33WKS,WATER BROKE Intrauterine pregnancy: [redacted]w[redacted]d     Secondary diagnosis:  Principal Problem:   Supervision of high risk pregnancy, antepartum Active Problems:   HSV-2 seropositive   Gestational diabetes mellitus, class A2   Normal labor  Additional problems: GBS pos     Discharge diagnosis: Term Pregnancy Delivered and GDM A2                                                                                                Post partum procedures:none  Augmentation: Pitocin  Complications: None  Hospital course:  Onset of Labor With Vaginal Delivery     32 y.o. yo G5P5005 at [redacted]w[redacted]d was admitted in Latent Labor on 04/20/2017. Patient had an uncomplicated labor course as follows:  Membrane Rupture Time/Date: 9:00 AM ,04/20/2017   Intrapartum Procedures: Episiotomy: None [1]                                         Lacerations:  None [1]  Patient had a delivery of a Viable infant. 04/21/2017  Information for the patient's newborn:  Emberlynn, Riggan [[id number]]  Delivery Method: Vag-Spont    Pateint had an uncomplicated postpartum course.  She is ambulating, tolerating a regular diet, passing flatus, and urinating well. Patient is discharged home in stable condition on 04/23/17.   Physical exam  Vitals:   04/21/17 1748 04/22/17 0613 04/22/17 1905 04/23/17 0500  BP: (!) 107/56 (!) 115/57 109/81 128/65  Pulse: 76 68 84 75  Resp: 17 18 18 18   Temp: 98.4 F (36.9 C) 98.1 F (36.7 C) 98.3 F (36.8 C) 98.1 F (36.7 C)  TempSrc: Oral Oral Oral Oral  SpO2: 99%     Weight:    101.2 kg (223 lb)  Height:       General: alert and cooperative Lochia: appropriate Uterine Fundus: firm Incision: N/A DVT Evaluation: No evidence of DVT seen on physical exam. Labs: Lab  Results  Component Value Date   WBC 10.2 04/20/2017   HGB 12.8 04/20/2017   HCT 38.7 04/20/2017   MCV 89.0 04/20/2017   PLT 208 04/20/2017   CMP Latest Ref Rng & Units 09/20/2016  Glucose 65 - 99 mg/dL 99  BUN 6 - 20 mg/dL 7  Creatinine 0.86 - 5.78 mg/dL 4.69  Sodium 629 - 528 mmol/L 134(L)  Potassium 3.5 - 5.1 mmol/L 3.8  Chloride 101 - 111 mmol/L 102  CO2 22 - 32 mmol/L 25  Calcium 8.9 - 10.3 mg/dL 4.1(L)  Total Protein 6.5 - 8.1 g/dL 6.7  Total Bilirubin 0.3 - 1.2 mg/dL 0.7  Alkaline Phos 38 - 126 U/L 60  AST 15 - 41 U/L 16  ALT 14 - 54 U/L 21    Discharge instruction: per After Visit Summary and "Baby and Me Booklet".  After visit meds:  Allergies as of 04/23/2017   No Known Allergies     Medication List    STOP taking these medications   acetaminophen 500 MG tablet Commonly known as:  TYLENOL   acyclovir 400 MG tablet Commonly known as:  ZOVIRAX   diphenhydrAMINE 25 MG tablet Commonly known as:  BENADRYL   metFORMIN 500 MG tablet Commonly known as:  GLUCOPHAGE   nystatin ointment Commonly known as:  MYCOSTATIN   triamcinolone ointment 0.1 % Commonly known as:  KENALOG     TAKE these medications   ibuprofen 600 MG tablet Commonly known as:  ADVIL,MOTRIN Take 1 tablet (600 mg total) by mouth every 6 (six) hours as needed.   prenatal vitamin w/FE, FA 27-1 MG Tabs tablet Take 1 tablet by mouth daily at 12 noon.       Diet: routine diet  Activity: Advance as tolerated. Pelvic rest for 6 weeks.   Outpatient follow up:6 weeks- needs 2hr GTT Follow up Appt:No future appointments. Follow up Visit:No Follow-up on file.  Postpartum contraception: IUD Paragard  Newborn Data: Live born female  Birth Weight: 7 lb 3.5 oz (3275 g) APGAR: 9, 9  Newborn Delivery   Birth date/time:  04/21/2017 00:30:00 Delivery type:  Vaginal, Spontaneous     Baby Feeding: Bottle Disposition:home with mother   04/23/2017 Cam HaiSHAW, KIMBERLY, CNM 7:18 AM

## 2017-04-23 NOTE — Discharge Instructions (Signed)
Vaginal Delivery, Care After °Refer to this sheet in the next few weeks. These instructions provide you with information about caring for yourself after vaginal delivery. Your health care provider may also give you more specific instructions. Your treatment has been planned according to current medical practices, but problems sometimes occur. Call your health care provider if you have any problems or questions. °What can I expect after the procedure? °After vaginal delivery, it is common to have: °· Some bleeding from your vagina. °· Soreness in your abdomen, your vagina, and the area of skin between your vaginal opening and your anus (perineum). °· Pelvic cramps. °· Fatigue. ° °Follow these instructions at home: °Medicines °· Take over-the-counter and prescription medicines only as told by your health care provider. °· If you were prescribed an antibiotic medicine, take it as told by your health care provider. Do not stop taking the antibiotic until it is finished. °Driving ° °· Do not drive or operate heavy machinery while taking prescription pain medicine. °· Do not drive for 24 hours if you received a sedative. °Lifestyle °· Do not drink alcohol. This is especially important if you are breastfeeding or taking medicine to relieve pain. °· Do not use tobacco products, including cigarettes, chewing tobacco, or e-cigarettes. If you need help quitting, ask your health care provider. °Eating and drinking °· Drink at least 8 eight-ounce glasses of water every day unless you are told not to by your health care provider. If you choose to breastfeed your baby, you may need to drink more water than this. °· Eat high-fiber foods every day. These foods may help prevent or relieve constipation. High-fiber foods include: °? Whole grain cereals and breads. °? Brown rice. °? Beans. °? Fresh fruits and vegetables. °Activity °· Return to your normal activities as told by your health care provider. Ask your health care provider  what activities are safe for you. °· Rest as much as possible. Try to rest or take a nap when your baby is sleeping. °· Do not lift anything that is heavier than your baby or 10 lb (4.5 kg) until your health care provider says that it is safe. °· Talk with your health care provider about when you can engage in sexual activity. This may depend on your: °? Risk of infection. °? Rate of healing. °? Comfort and desire to engage in sexual activity. °Vaginal Care °· If you have an episiotomy or a vaginal tear, check the area every day for signs of infection. Check for: °? More redness, swelling, or pain. °? More fluid or blood. °? Warmth. °? Pus or a bad smell. °· Do not use tampons or douches until your health care provider says this is safe. °· Watch for any blood clots that may pass from your vagina. These may look like clumps of dark red, brown, or black discharge. °General instructions °· Keep your perineum clean and dry as told by your health care provider. °· Wear loose, comfortable clothing. °· Wipe from front to back when you use the toilet. °· Ask your health care provider if you can shower or take a bath. If you had an episiotomy or a perineal tear during labor and delivery, your health care provider may tell you not to take baths for a certain length of time. °· Wear a bra that supports your breasts and fits you well. °· If possible, have someone help you with household activities and help care for your baby for at least a few days after   you leave the hospital. °· Keep all follow-up visits for you and your baby as told by your health care provider. This is important. °Contact a health care provider if: °· You have: °? Vaginal discharge that has a bad smell. °? Difficulty urinating. °? Pain when urinating. °? A sudden increase or decrease in the frequency of your bowel movements. °? More redness, swelling, or pain around your episiotomy or vaginal tear. °? More fluid or blood coming from your episiotomy or  vaginal tear. °? Pus or a bad smell coming from your episiotomy or vaginal tear. °? A fever. °? A rash. °? Little or no interest in activities you used to enjoy. °? Questions about caring for yourself or your baby. °· Your episiotomy or vaginal tear feels warm to the touch. °· Your episiotomy or vaginal tear is separating or does not appear to be healing. °· Your breasts are painful, hard, or turn red. °· You feel unusually sad or worried. °· You feel nauseous or you vomit. °· You pass large blood clots from your vagina. If you pass a blood clot from your vagina, save it to show to your health care provider. Do not flush blood clots down the toilet without having your health care provider look at them. °· You urinate more than usual. °· You are dizzy or light-headed. °· You have not breastfed at all and you have not had a menstrual period for 12 weeks after delivery. °· You have stopped breastfeeding and you have not had a menstrual period for 12 weeks after you stopped breastfeeding. °Get help right away if: °· You have: °? Pain that does not go away or does not get better with medicine. °? Chest pain. °? Difficulty breathing. °? Blurred vision or spots in your vision. °? Thoughts about hurting yourself or your baby. °· You develop pain in your abdomen or in one of your legs. °· You develop a severe headache. °· You faint. °· You bleed from your vagina so much that you fill two sanitary pads in one hour. °This information is not intended to replace advice given to you by your health care provider. Make sure you discuss any questions you have with your health care provider. °Document Released: 02/13/2000 Document Revised: 07/30/2015 Document Reviewed: 03/02/2015 °Elsevier Interactive Patient Education © 2018 [employer] ° °

## 2017-04-25 ENCOUNTER — Other Ambulatory Visit: Payer: Medicaid Other | Admitting: Women's Health

## 2017-04-28 ENCOUNTER — Other Ambulatory Visit: Payer: Medicaid Other | Admitting: Women's Health

## 2017-05-23 ENCOUNTER — Encounter: Payer: Self-pay | Admitting: Women's Health

## 2017-05-23 ENCOUNTER — Other Ambulatory Visit: Payer: Self-pay

## 2017-05-23 ENCOUNTER — Ambulatory Visit (INDEPENDENT_AMBULATORY_CARE_PROVIDER_SITE_OTHER): Payer: Medicaid Other | Admitting: Women's Health

## 2017-05-23 DIAGNOSIS — Z3202 Encounter for pregnancy test, result negative: Secondary | ICD-10-CM

## 2017-05-23 DIAGNOSIS — Z8632 Personal history of gestational diabetes: Secondary | ICD-10-CM

## 2017-05-23 LAB — POCT URINE PREGNANCY: Preg Test, Ur: NEGATIVE

## 2017-05-23 MED ORDER — LO LOESTRIN FE 1 MG-10 MCG / 10 MCG PO TABS: 1.0000 | ORAL_TABLET | Freq: Every day | ORAL | 3 refills | Status: DC

## 2017-05-23 NOTE — Patient Instructions (Signed)
You will have your sugar test next visit.  Please do not eat or drink anything after midnight the night before you come, not even water.  You will be here for at least two hours.    

## 2017-05-23 NOTE — Progress Notes (Signed)
POSTPARTUM VISIT Patient name: [name] MRN [id number]  Date of birth: 1986/02/10 Chief Complaint:   Postpartum Care  History of Present Illness:   [name] is a 32 y.o. [id number] African American female being seen today for a postpartum visit. She is 4 weeks postpartum following a spontaneous vaginal delivery at 38.5 gestational weeks. Anesthesia: none. I have fully reviewed the prenatal and intrapartum course. Pregnancy complicated by A2DM. Postpartum course has been uncomplicated. Bleeding no bleeding. Bowel function is normal. Bladder function is normal.  Patient is sexually active. Last sexual activity: 3/22.  Contraception method is condoms and wants LoLoestrin. Does not smoke, no h/o HTN, DVT/PE, CVA, MI, or migraines w/ aura.  [place] Postpartum Depression Screening: negative. Score 0.   Last pap 06/19/13.  Results were normal .  No LMP recorded.  Baby's course has been uncomplicated. Baby is feeding by bottle.  Review of Systems:   Pertinent items are noted in HPI Denies Abnormal vaginal discharge w/ itching/odor/irritation, headaches, visual changes, shortness of breath, chest pain, abdominal pain, severe nausea/vomiting, or problems with urination or bowel movements. Pertinent History Reviewed:  Reviewed past medical,surgical, obstetrical and family history.  Reviewed problem list, medications and allergies. OB History  Gravida Para Term Preterm AB Living  5 5 5     5   SAB TAB Ectopic Multiple Live Births        0 5    # Outcome Date GA Lbr Len/2nd Weight Sex Delivery Anes PTL Lv  5 Term 04/21/17 6927w5d 15:20 / 00:10 7 lb 3.5 oz (3.275 kg) M Vag-Spont None  LIV  4 Term 01/16/14 5327w5d 05:55 / 00:08 7 lb 5 oz (3.317 kg) F Vag-Spont None N LIV     Birth Comments: Neg Hep B, HIV, RPR, +GBS Observed for 48 hrs b/o GBS Hx of maternal THC use, but neg urine and meconiium drug screen on baby No jaundice, no feeding or respiratory problems  3 Term 11/26/09  7241w0d  8 lb (3.629 kg) M Vag-Spont None N LIV  2 Term 09/09/05 10860w0d  8 lb (3.629 kg) F Vag-Spont None N LIV  1 Term 04/08/04 6251w0d  8 lb (3.629 kg) M Vag-Spont None N LIV   Physical Assessment:   Vitals:   05/23/17 1423  BP: 120/76  Pulse: 83  Weight: 216 lb (98 kg)  Height: 5\' 3"  (1.6 m)  Body mass index is 38.26 kg/m.       Physical Examination:   General appearance: alert, well appearing, and in no distress  Mental status: alert, oriented to person, place, and time  Skin: warm & dry   Cardiovascular: normal heart rate noted   Respiratory: normal respiratory effort, no distress   Breasts: deferred, no complaints   Abdomen: soft, non-tender   Pelvic: VULVA: normal appearing vulva with no masses, tenderness or lesions, CERVIX: normal appearing cervix without discharge or lesions, UTERUS: uterus is normal size, shape, consistency and nontender  Rectal: no hemorrhoids  Extremities: no edema       Results for orders placed or performed in visit on 05/23/17 (from the past 24 hour(s))  POCT urine pregnancy   Collection Time: 05/23/17  2:28 PM  Result Value Ref Range   Preg Test, Ur Negative Negative    Assessment & Plan:  1) Postpartum exam 2) 4 wks s/p SVB 3) Bottlefeeding 4) Depression screening 5) Contraception counseling, pt prefers LoLoestrin  6) A2DM during pregnancy> will get 2hr GTT/A1C @ 12wks pp  Meds:  Meds ordered this encounter  Medications  . LO LOESTRIN FE 1 MG-10 MCG / 10 MCG tablet    Sig: Take 1 tablet by mouth daily.    Dispense:  3 Package    Refill:  3    For co-pay card, pt to text "Lo Loestrin Fe " to [phone]              Co-pay card must be run in second position  "other coverage code 3"  if denied d/t PA, step edit, or insurance denial    Order Specific Question:   Supervising Provider    Answer:   Lazaro Arms [[id number]]    Follow-up: Return in about 8 weeks (around 07/18/2017) for 2hr pp sugar test, then from now for f/u.   Orders Placed  This Encounter  Procedures  . POCT urine pregnancy    Cheral Marker CNM, [facility] 05/23/2017 2:55 PM

## 2017-06-12 ENCOUNTER — Encounter (HOSPITAL_COMMUNITY): Payer: Self-pay | Admitting: Emergency Medicine

## 2017-06-12 ENCOUNTER — Other Ambulatory Visit: Payer: Self-pay

## 2017-06-12 ENCOUNTER — Emergency Department (HOSPITAL_COMMUNITY)
Admission: EM | Admit: 2017-06-12 | Discharge: 2017-06-12 | Disposition: A | Discharge status: Home / Self Care | Payer: Medicaid Other | Attending: Emergency Medicine | Admitting: Emergency Medicine

## 2017-06-12 DIAGNOSIS — Z87891 Personal history of nicotine dependence: Secondary | ICD-10-CM | POA: Diagnosis not present

## 2017-06-12 DIAGNOSIS — Z79899 Other long term (current) drug therapy: Secondary | ICD-10-CM | POA: Insufficient documentation

## 2017-06-12 DIAGNOSIS — L0501 Pilonidal cyst with abscess: Secondary | ICD-10-CM | POA: Insufficient documentation

## 2017-06-12 DIAGNOSIS — L0231 Cutaneous abscess of buttock: Secondary | ICD-10-CM | POA: Diagnosis present

## 2017-06-12 MED ORDER — HYDROCODONE-ACETAMINOPHEN 5-325 MG PO TABS: ORAL_TABLET | ORAL | 0 refills | Status: DC

## 2017-06-12 MED ORDER — POVIDONE-IODINE 10 % EX SOLN
1.0000 "application " | Freq: Once | CUTANEOUS | Status: AC
  Administered 2017-06-12: 1 via TOPICAL

## 2017-06-12 MED ORDER — SULFAMETHOXAZOLE-TRIMETHOPRIM 800-160 MG PO TABS: 1.0000 | ORAL_TABLET | Freq: Two times a day (BID) | ORAL | 0 refills | Status: AC

## 2017-06-12 MED ORDER — LIDOCAINE HCL (PF) 1 % IJ SOLN
5.0000 mL | Freq: Once | INTRAMUSCULAR | Status: AC
  Administered 2017-06-12: 5 mL via INTRADERMAL

## 2017-06-12 MED ORDER — POVIDONE-IODINE 10 % EX SOLN
CUTANEOUS | Status: AC
  Administered 2017-06-12: 1 via TOPICAL
  Filled 2017-06-12: qty 15

## 2017-06-12 MED ORDER — LIDOCAINE HCL (PF) 1 % IJ SOLN
INTRAMUSCULAR | Status: AC
  Administered 2017-06-12: 5 mL via INTRADERMAL
  Filled 2017-06-12: qty 10

## 2017-06-12 NOTE — Discharge Instructions (Signed)
Warm water soaks 2-3 times a day 20 minutes each.  Packing will need to be removed in 2 days.  You may return here if needed for packing removal.  Take the antibiotic as directed as long as you are not breast-feeding.  Follow-up with your primary doctor or return here sooner if needed.

## 2017-06-12 NOTE — ED Triage Notes (Signed)
Patient c/o abscess to left upper buttock x1 week that is progressively increasing in size and pain. Denies any drainage or fevers. Patient took tylenol yesterday with brief relief but no medication today for pain.

## 2017-06-12 NOTE — ED Provider Notes (Signed)
[facility] EMERGENCY DEPARTMENT Provider Note   CSN: [id number] Arrival date & time: 06/12/17  1138     History   Chief Complaint Chief Complaint  Patient presents with  . Abscess    HPI [name] is a 32 y.o. female.  HPI   [name] is a 32 y.o. female who presents to the Emergency Department complaining of pain and swelling of her left upper buttocks for 1 week.  She states the pain is gradually been increasing in severity and she is noticed a large area of swelling that is worse today.  She reports a history of boils to this area and states her pain is similar to previous.  No fever or chills.  She has been taking Tylenol without relief.  She states pain is worse with sitting.  She denies abdominal pain, nausea or vomiting, pain to her lower legs, difficulty with urination or bowel movements.  Patient is 7 weeks postpartum, denies complications during birth, is not currently breast-feeding.   Past Medical History:  Diagnosis Date  . BV (bacterial vaginosis) 04/10/2015  . Depression   . HSV infection   . Irregular bleeding 01/17/2013  . Urinary frequency 04/10/2015  . Vaginal discharge 04/19/2013  . Vaginal odor 04/10/2015  . Vitamin D deficiency 04/11/2015  . Yeast infection 04/19/2013    Patient Active Problem List   Diagnosis Date Noted  . History of gestational diabetes 02/15/2017  . Vitamin D deficiency 04/11/2015  . S/P cholecystectomy 06/19/2013  . 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 Hypokalemia 09/02/2012    Past Surgical History:  Procedure Laterality Date  . CHOLECYSTECTOMY N/A 09/05/2012   Procedure: LAPAROSCOPIC CHOLECYSTECTOMY;  Surgeon: Emelia Loron, MD;  Location: [facility] OR;  Service: General;  Laterality: N/A;  . ERCP N/A 09/04/2012   Procedure: ENDOSCOPIC RETROGRADE CHOLANGIOPANCREATOGRAPHY (ERCP);  Surgeon: Rachael Fee, MD;  Location: [facility] OR;  Service: Endoscopy;  Laterality: N/A;  . WISDOM TOOTH EXTRACTION       OB History    Gravida  5   Para  5   Term  5   Preterm      AB      Living  5     SAB      TAB      Ectopic      Multiple  0   Live Births  5            Home Medications    Prior to Admission medications   Medication Sig Start Date End Date Taking? Authorizing Provider  ibuprofen (ADVIL,MOTRIN) 600 MG tablet Take 1 tablet (600 mg total) by mouth every 6 (six) hours as needed. 04/23/17   Cam Hai D, CNM  LO LOESTRIN FE 1 MG-10 MCG / 10 MCG tablet Take 1 tablet by mouth daily. 05/23/17   Cheral Marker, CNM  prenatal vitamin w/FE, FA (PRENATAL 1 + 1) 27-1 MG TABS tablet Take 1 tablet by mouth daily at 12 noon. 08/19/16   Adline Potter, NP    Family History Family History  Problem Relation Age of Onset  . CAD Maternal Grandmother   . Diabetes Maternal Grandmother   . Hypertension Mother   . Diabetes Mother   . Breast cancer Maternal Aunt   . Hypertension Maternal Grandfather   . Stroke Maternal Grandfather     Social History Social History   Tobacco Use  . Smoking status: Former Smoker    Packs/day: 0.25    Years: 10.00  Pack years: 2.50    Types: Cigarettes    Last attempt to quit: 08/29/2012    Years since quitting: 4.7  . Smokeless tobacco: Never Used  Substance Use Topics  . Alcohol use: No  . Drug use: No    Comment: pt denies     Allergies   Patient has no known allergies.   Review of Systems Review of Systems  Constitutional: Negative for chills and fever.  Gastrointestinal: Negative for nausea and vomiting.  Musculoskeletal: Negative for arthralgias and joint swelling.  Skin: Positive for color change.       Abscess   Hematological: Negative for adenopathy.  All other systems reviewed and are negative.    Physical Exam Updated Vital Signs BP 113/86 (BP Location: Right Arm)   Pulse 74   Temp 98.5 F (36.9 C) (Oral)   Resp 18   Ht 5\' 4"  (1.626 m)   Wt 97.5 kg (215 lb)   LMP 05/29/2017   SpO2 99%   Breastfeeding? No    BMI 36.90 kg/m   Physical Exam  Constitutional: She is oriented to person, place, and time. She appears well-developed and well-nourished. No distress.  HENT:  Head: Normocephalic and atraumatic.  Mouth/Throat: Oropharynx is clear and moist.  Cardiovascular: Normal rate, regular rhythm and intact distal pulses.  No murmur heard. Pulmonary/Chest: Effort normal and breath sounds normal. No respiratory distress.  Abdominal: Soft. She exhibits no distension. There is no tenderness. There is no guarding.  Musculoskeletal: Normal range of motion.  Neurological: She is alert and oriented to person, place, and time. She exhibits normal muscle tone. Coordination normal.  Skin: Skin is warm and dry. There is erythema.  Localized tenderness and fluctuance of the upper gluteal cleft.  Mild surrounding erythema.  No drainage.  No rectal tenderness.  Psychiatric: She has a normal mood and affect.  Nursing note and vitals reviewed.    ED Treatments / Results  Labs (all labs ordered are listed, but only abnormal results are displayed) Labs Reviewed - No data to display  EKG None  Radiology No results found.  Procedures Procedures (including critical care time)  INCISION AND DRAINAGE Performed by: Maxwell CaulRIPLETT,Adilen Pavelko L. Consent: Verbal consent obtained. Risks and benefits: risks, benefits and alternatives were discussed Type: abscess  Body area: top of gluteal fold  Anesthesia: local infiltration  Incision was made with a #11 scalpel.  Local anesthetic: lidocaine 1 % w/o epinephrine  Anesthetic total: 4 ml  Complexity: complex Blunt dissection to break up loculations  Drainage: purulent  Drainage amount: large  Packing material: 1/2 in iodoform gauze  Patient tolerance: Patient tolerated the procedure well with no immediate complications.     Medications Ordered in ED Medications  povidone-iodine (BETADINE) 10 % external solution (has no administration in time range)    lidocaine (PF) (XYLOCAINE) 1 % injection (has no administration in time range)     Initial Impression / Assessment and Plan / ED Course  I have reviewed the triage vital signs and the nursing notes.  Pertinent labs & imaging results that were available during my care of the patient were reviewed by me and considered in my medical decision making (see chart for details).     Pt well appearing, non-toxic.  Recurrent pilonidal abscess.  successful I&D.  Pt reports feeling better.  Agrees to plan with warm water soaks, abx and gen surgery f/u.  Packing removal in 2 days.  Return precautions discussed.    Final Clinical Impressions(s) / ED  Diagnoses   Final diagnoses:  Pilonidal abscess    ED Discharge Orders    None       Pauline Aus, PA-C 06/12/17 1644    Samuel Jester, DO 06/15/17 2206

## 2017-06-28 ENCOUNTER — Ambulatory Visit: Payer: Medicaid Other | Admitting: General Surgery

## 2017-07-11 ENCOUNTER — Telehealth: Payer: Self-pay | Admitting: Women's Health

## 2017-07-11 NOTE — Telephone Encounter (Signed)
Patient states she needs documentation of her flu shot.  Will print out and patient will pick up later today.

## 2017-07-11 NOTE — Telephone Encounter (Signed)
Patient called stating that she would like to know what shots she had done here. Please contact pt

## 2017-07-18 ENCOUNTER — Other Ambulatory Visit: Payer: Medicaid Other

## 2017-07-26 ENCOUNTER — Other Ambulatory Visit: Payer: Medicaid Other

## 2017-07-27 ENCOUNTER — Other Ambulatory Visit: Payer: Medicaid Other

## 2017-07-28 LAB — GLUCOSE TOLERANCE, 2 HOURS W/ 1HR
GLUCOSE, 1 HOUR: 143 mg/dL (ref 65–179)
GLUCOSE, 2 HOUR: 93 mg/dL (ref 65–152)
GLUCOSE, FASTING: 88 mg/dL (ref 65–91)

## 2017-07-30 ENCOUNTER — Encounter (HOSPITAL_COMMUNITY): Payer: Self-pay | Admitting: Emergency Medicine

## 2017-07-30 ENCOUNTER — Emergency Department (HOSPITAL_COMMUNITY)
Admission: EM | Admit: 2017-07-30 | Discharge: 2017-07-30 | Disposition: A | Discharge status: Home / Self Care | Payer: Medicaid Other | Attending: Emergency Medicine | Admitting: Emergency Medicine

## 2017-07-30 ENCOUNTER — Emergency Department (HOSPITAL_COMMUNITY): Payer: Medicaid Other

## 2017-07-30 ENCOUNTER — Other Ambulatory Visit: Payer: Self-pay

## 2017-07-30 DIAGNOSIS — R05 Cough: Secondary | ICD-10-CM | POA: Diagnosis present

## 2017-07-30 DIAGNOSIS — B9789 Other viral agents as the cause of diseases classified elsewhere: Secondary | ICD-10-CM | POA: Diagnosis not present

## 2017-07-30 DIAGNOSIS — Z87891 Personal history of nicotine dependence: Secondary | ICD-10-CM | POA: Diagnosis not present

## 2017-07-30 DIAGNOSIS — Z79899 Other long term (current) drug therapy: Secondary | ICD-10-CM | POA: Diagnosis not present

## 2017-07-30 DIAGNOSIS — J069 Acute upper respiratory infection, unspecified: Secondary | ICD-10-CM | POA: Insufficient documentation

## 2017-07-30 MED ORDER — IBUPROFEN 800 MG PO TABS: 800.0000 mg | ORAL_TABLET | Freq: Three times a day (TID) | ORAL | 0 refills | Status: DC

## 2017-07-30 MED ORDER — MAGIC MOUTHWASH W/LIDOCAINE: 5.0000 mL | Freq: Three times a day (TID) | ORAL | 0 refills | Status: DC | PRN

## 2017-07-30 NOTE — Discharge Instructions (Addendum)
Drink plenty of fluids.  You may take Tylenol every 4 hours if needed for fever.  Continue taking her current cough medication as directed.  Return to the ER for any worsening symptoms.

## 2017-07-30 NOTE — ED Triage Notes (Signed)
Patient complaining of cough, body aches, and headache x 3 days.

## 2017-07-31 NOTE — ED Provider Notes (Signed)
[facility] EMERGENCY DEPARTMENT Provider Note   CSN: [id number] Arrival date & time: 07/30/17  1200     History   Chief Complaint Chief Complaint  Patient presents with  . Cough    HPI [name] is a 32 y.o. female.  HPI   [name] is a 32 y.o. female who presents to the Emergency Department complaining of cough, generalized body aches, frontal headache, sore throat, and nasal congestion.  Symptoms present for 3 days.  Family member with similar symptoms recently.  She has not tried any over-the-counter medications for relief.  Cough is mostly nonproductive.  Describes headache as frontal and intermittent.  She denies fever, chest pain, and shortness of breath   Past Medical History:  Diagnosis Date  . BV (bacterial vaginosis) 04/10/2015  . Depression   . HSV infection   . Irregular bleeding 01/17/2013  . Urinary frequency 04/10/2015  . Vaginal discharge 04/19/2013  . Vaginal odor 04/10/2015  . Vitamin D deficiency 04/11/2015  . Yeast infection 04/19/2013    Patient Active Problem List   Diagnosis Date Noted  . History of gestational diabetes 02/15/2017  . Vitamin D deficiency 04/11/2015  . S/P cholecystectomy 06/19/2013  . 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 Hypokalemia 09/02/2012    Past Surgical History:  Procedure Laterality Date  . CHOLECYSTECTOMY N/A 09/05/2012   Procedure: LAPAROSCOPIC CHOLECYSTECTOMY;  Surgeon: Emelia LoronMatthew Wakefield, MD;  [facility];  Service: General;  Laterality: N/A;  . ERCP N/A 09/04/2012   Procedure: ENDOSCOPIC RETROGRADE CHOLANGIOPANCREATOGRAPHY (ERCP);  Surgeon: Rachael Feeaniel P Jacobs, MD;  [facility];  Service: Endoscopy;  Laterality: N/A;  . WISDOM TOOTH EXTRACTION       OB History    Gravida  5   Para  5   Term  5   Preterm      AB      Living  5     SAB      TAB      Ectopic      Multiple  0   Live Births  5            Home Medications    Prior to Admission medications   Medication Sig Start  Date End Date Taking? Authorizing Provider  HYDROcodone-acetaminophen (NORCO/VICODIN) 5-325 MG tablet Take one tab po q 4 hrs prn pain 06/12/17   Coalton Arch, PA-C  ibuprofen (ADVIL,MOTRIN) 800 MG tablet Take 1 tablet (800 mg total) by mouth 3 (three) times daily. 07/30/17   Amogh Komatsu, PA-C  LO LOESTRIN FE 1 MG-10 MCG / 10 MCG tablet Take 1 tablet by mouth daily. 05/23/17   Cheral MarkerBooker, Kimberly R, CNM  magic mouthwash w/lidocaine SOLN Take 5 mLs by mouth 3 (three) times daily as needed for mouth pain. Swish and spit, do not swallow 07/30/17   Roe Wilner, PA-C  prenatal vitamin w/FE, FA (PRENATAL 1 + 1) 27-1 MG TABS tablet Take 1 tablet by mouth daily at 12 noon. 08/19/16   Adline PotterGriffin, Jennifer A, NP    Family History Family History  Problem Relation Age of Onset  . CAD Maternal Grandmother   . Diabetes Maternal Grandmother   . Hypertension Mother   . Diabetes Mother   . Breast cancer Maternal Aunt   . Hypertension Maternal Grandfather   . Stroke Maternal Grandfather     Social History Social History   Tobacco Use  . Smoking status: Former Smoker    Packs/day: 0.25    Years: 10.00  Pack years: 2.50    Types: Cigarettes    Last attempt to quit: 08/29/2012    Years since quitting: 4.9  . Smokeless tobacco: Never Used  Substance Use Topics  . Alcohol use: No  . Drug use: No    Comment: pt denies     Allergies   Patient has no known allergies.   Review of Systems Review of Systems  Constitutional: Negative for activity change, appetite change, chills and fever.  HENT: Positive for congestion and sore throat. Negative for facial swelling, rhinorrhea and trouble swallowing.   Eyes: Negative for visual disturbance.  Respiratory: Positive for cough. Negative for chest tightness, shortness of breath, wheezing and stridor.   Cardiovascular: Negative for chest pain.  Gastrointestinal: Negative for abdominal pain, nausea and vomiting.  Genitourinary: Negative for dysuria.    Musculoskeletal: Positive for myalgias. Negative for neck pain and neck stiffness.  Skin: Negative for rash.  Neurological: Negative for dizziness, weakness, numbness and headaches.  Hematological: Negative for adenopathy.  Psychiatric/Behavioral: Negative for confusion.  All other systems reviewed and are negative.    Physical Exam Updated Vital Signs BP 129/69 (BP Location: Right Arm)   Pulse 92   Temp (!) 97.5 F (36.4 C) (Oral)   Resp 14   Ht 5\' 4"  (1.626 m)   Wt 97.5 kg (215 lb)   LMP 07/16/2017   SpO2 100%   BMI 36.90 kg/m   Physical Exam  Constitutional: She appears well-developed and well-nourished. No distress.  HENT:  Head: Normocephalic and atraumatic.  Right Ear: Tympanic membrane and ear canal normal.  Left Ear: Tympanic membrane and ear canal normal.  Nose: Mucosal edema and rhinorrhea present.  Mouth/Throat: Uvula is midline and mucous membranes are normal. No trismus in the jaw. No uvula swelling. Posterior oropharyngeal erythema present. No oropharyngeal exudate, posterior oropharyngeal edema or tonsillar abscesses.  Eyes: Conjunctivae are normal.  Neck: Normal range of motion and phonation normal. Neck supple. No Brudzinski's sign and no Kernig's sign noted.  Cardiovascular: Normal rate, regular rhythm and intact distal pulses.  No murmur heard. Pulmonary/Chest: Effort normal and breath sounds normal. No respiratory distress. She has no wheezes. She has no rales.  Abdominal: Soft. She exhibits no distension. There is no tenderness.  Musculoskeletal: Normal range of motion. She exhibits no edema.  Lymphadenopathy:    She has no cervical adenopathy.  Neurological: She is alert. She exhibits normal muscle tone.  Skin: Skin is warm and dry. Capillary refill takes less than 2 seconds. No rash noted.  Nursing note and vitals reviewed.    ED Treatments / Results  Labs (all labs ordered are listed, but only abnormal results are displayed) Labs Reviewed -  No data to display  EKG None  Radiology Dg Chest 2 View  Result Date: 07/30/2017 CLINICAL DATA:  Cough.  Shortness of breath. EXAM: CHEST - 2 VIEW COMPARISON:  None. FINDINGS: The heart size and mediastinal contours are within normal limits. Both lungs are clear. The visualized skeletal structures are unremarkable. IMPRESSION: No active cardiopulmonary disease. Electronically Signed   By: Gerome Sam III M.D   On: 07/30/2017 12:22    Procedures Procedures (including critical care time)  Medications Ordered in ED Medications - No data to display   Initial Impression / Assessment and Plan / ED Course  I have reviewed the triage vital signs and the nursing notes.  Pertinent labs & imaging results that were available during my care of the patient were reviewed by me and  considered in my medical decision making (see chart for details).     Patient well-appearing.  Vitals reviewed.  Sx's likely viral.  Patient agrees to treatment plan and PCP follow-up if needed  Final Clinical Impressions(s) / ED Diagnoses   Final diagnoses:  Viral URI with cough    ED Discharge Orders        Ordered    ibuprofen (ADVIL,MOTRIN) 800 MG tablet  3 times daily     07/30/17 1258    magic mouthwash w/lidocaine SOLN  3 times daily PRN     07/30/17 1258       Amaziah Ghosh, [place], PA-C 07/31/17 2029    Samuel Jester, DO 08/03/17 1234

## 2017-08-05 ENCOUNTER — Ambulatory Visit: Payer: Medicaid Other | Admitting: Women's Health

## 2017-08-05 ENCOUNTER — Other Ambulatory Visit: Payer: Self-pay

## 2017-08-05 ENCOUNTER — Encounter: Payer: Self-pay | Admitting: Women's Health

## 2017-08-05 VITALS — BP 119/63 | HR 82 | Ht 64.0 in | Wt 218.0 lb

## 2017-08-05 DIAGNOSIS — N898 Other specified noninflammatory disorders of vagina: Secondary | ICD-10-CM

## 2017-08-05 LAB — POCT WET PREP (WET MOUNT)
CLUE CELLS WET PREP WHIFF POC: NEGATIVE
TRICHOMONAS WET PREP HPF POC: ABSENT

## 2017-08-05 NOTE — Progress Notes (Signed)
   GYN VISIT Patient name: [name] Cogan MRN [id number]  Date of birth: Sep 12, 1985 Chief Complaint:   Vaginal Discharge  History of Present Illness:   [name] Burrowes is a 32 y.o. [id number] African American female being seen today for report of vaginal d/c x 1wk, smelled fishy, then that went away. No itching/irritation. Never started COCs, just using condoms.      Patient's last menstrual period was 07/16/2017. The current method of family planning is condoms. Last pap 06/19/13. Results were:  normal Review of Systems:   Pertinent items are noted in HPI Denies fever/chills, dizziness, headaches, visual disturbances, fatigue, shortness of breath, chest pain, abdominal pain, vomiting, abnormal vaginal discharge/itching/odor/irritation, problems with periods, bowel movements, urination, or intercourse unless otherwise stated above.  Pertinent History Reviewed:  Reviewed past medical,surgical, social, obstetrical and family history.  Reviewed problem list, medications and allergies. Physical Assessment:   Vitals:   08/05/17 1157  BP: 119/63  Pulse: 82  Weight: 218 lb (98.9 kg)  Height: 5\' 4"  (1.626 m)  Body mass index is 37.42 kg/m.       Physical Examination:   General appearance: alert, well appearing, and in no distress  Mental status: alert, oriented to person, place, and time  Skin: warm & dry   Cardiovascular: normal heart rate noted  Respiratory: normal respiratory effort, no distress  Abdomen: soft, non-tender   Pelvic: VULVA: normal appearing vulva with no masses, tenderness or lesions, VAGINA: normal appearing vagina with normal color and discharge, no lesions, CERVIX: normal appearing cervix without discharge or lesions  Extremities: no edema   Results for orders placed or performed in visit on 08/05/17 (from the past 24 hour(s))  POCT Wet Prep Mellody Drown([place])   Collection Time: 08/05/17 12:12 PM  Result Value Ref Range   Source Wet Prep POC vaginal    WBC, Wet  Prep HPF POC none    Bacteria Wet Prep HPF POC Few Few   BACTERIA WET PREP MORPHOLOGY POC     Clue Cells Wet Prep HPF POC None None   Clue Cells Wet Prep Whiff POC Negative Whiff    Yeast Wet Prep HPF POC None None   KOH Wet Prep POC  None   Trichomonas Wet Prep HPF POC Absent Absent    Assessment & Plan:  1) Normal vaginal d/c> will send gc/ct  Meds: No orders of the defined types were placed in this encounter.   Orders Placed This Encounter  Procedures  . GC/Chlamydia Probe Amp  . POCT Wet Prep [facility]([place])    Return for change 6/25 appt to pap & physical please.  Cheral MarkerKimberly R Aylani Spurlock CNM, [facility]NP-BC 08/05/2017 12:13 PM

## 2017-08-08 LAB — GC/CHLAMYDIA PROBE AMP
CHLAMYDIA, DNA PROBE: NEGATIVE
NEISSERIA GONORRHOEAE BY PCR: NEGATIVE

## 2017-08-23 ENCOUNTER — Other Ambulatory Visit: Payer: Medicaid Other | Admitting: Women's Health

## 2017-08-23 ENCOUNTER — Ambulatory Visit: Payer: Medicaid Other | Admitting: Women's Health

## 2017-08-23 ENCOUNTER — Encounter: Payer: Self-pay | Admitting: *Deleted

## 2017-09-11 ENCOUNTER — Encounter (HOSPITAL_COMMUNITY): Payer: Self-pay

## 2017-09-11 ENCOUNTER — Other Ambulatory Visit: Payer: Self-pay

## 2017-09-11 ENCOUNTER — Emergency Department (HOSPITAL_COMMUNITY)
Admission: EM | Admit: 2017-09-11 | Discharge: 2017-09-11 | Disposition: A | Discharge status: Home / Self Care | Payer: Medicaid Other | Attending: Emergency Medicine | Admitting: Emergency Medicine

## 2017-09-11 DIAGNOSIS — L0501 Pilonidal cyst with abscess: Secondary | ICD-10-CM | POA: Diagnosis not present

## 2017-09-11 DIAGNOSIS — R222 Localized swelling, mass and lump, trunk: Secondary | ICD-10-CM | POA: Diagnosis present

## 2017-09-11 DIAGNOSIS — Z87891 Personal history of nicotine dependence: Secondary | ICD-10-CM | POA: Insufficient documentation

## 2017-09-11 DIAGNOSIS — L0591 Pilonidal cyst without abscess: Secondary | ICD-10-CM

## 2017-09-11 MED ORDER — SULFAMETHOXAZOLE-TRIMETHOPRIM 800-160 MG PO TABS: 1.0000 | ORAL_TABLET | Freq: Two times a day (BID) | ORAL | 0 refills | Status: AC

## 2017-09-11 MED ORDER — LIDOCAINE HCL (PF) 1 % IJ SOLN
2.0000 mL | Freq: Once | INTRAMUSCULAR | Status: AC
  Administered 2017-09-11: 2 mL

## 2017-09-11 MED ORDER — LIDOCAINE HCL (PF) 1 % IJ SOLN
INTRAMUSCULAR | Status: AC
  Administered 2017-09-11: 2 mL
  Filled 2017-09-11: qty 2

## 2017-09-11 MED ORDER — CEPHALEXIN 500 MG PO CAPS: 500.0000 mg | ORAL_CAPSULE | Freq: Four times a day (QID) | ORAL | 0 refills | Status: DC

## 2017-09-11 NOTE — ED Provider Notes (Signed)
[facility] EMERGENCY DEPARTMENT Provider Note   CSN: [id number] Arrival date & time: 09/11/17  0133     History   Chief Complaint Chief Complaint  Patient presents with  . Recurrent Skin Infections    HPI [name] is a 32 y.o. female.  Patient is a 32 year old female presenting with complaints of pain and swelling to the top of her buttocks.  She has a history of a pilonidal cyst that was drained and she believes this has recurred.  She denies any injury or trauma.  She denies any fevers or chills.  Pain is worse with sitting and there are no alleviating factors.  The history is provided by the patient.    Past Medical History:  Diagnosis Date  . BV (bacterial vaginosis) 04/10/2015  . Depression   . HSV infection   . Irregular bleeding 01/17/2013  . Urinary frequency 04/10/2015  . Vaginal discharge 04/19/2013  . Vaginal odor 04/10/2015  . Vitamin D deficiency 04/11/2015  . Yeast infection 04/19/2013    Patient Active Problem List   Diagnosis Date Noted  . History of gestational diabetes 02/15/2017  . Vitamin D deficiency 04/11/2015  . S/P cholecystectomy 06/19/2013  . 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 Hypokalemia 09/02/2012    Past Surgical History:  Procedure Laterality Date  . CHOLECYSTECTOMY N/A 09/05/2012   Procedure: LAPAROSCOPIC CHOLECYSTECTOMY;  Surgeon: Emelia Loron, MD;  Location: [facility] OR;  Service: General;  Laterality: N/A;  . ERCP N/A 09/04/2012   Procedure: ENDOSCOPIC RETROGRADE CHOLANGIOPANCREATOGRAPHY (ERCP);  Surgeon: Rachael Fee, MD;  Location: [facility] OR;  Service: Endoscopy;  Laterality: N/A;  . WISDOM TOOTH EXTRACTION       OB History    Gravida  5   Para  5   Term  5   Preterm      AB      Living  5     SAB      TAB      Ectopic      Multiple  0   Live Births  5            Home Medications    Prior to Admission medications   Medication Sig Start Date End Date Taking? Authorizing Provider  ibuprofen  (ADVIL,MOTRIN) 800 MG tablet Take 1 tablet (800 mg total) by mouth 3 (three) times daily. 07/30/17   Triplett, Tammy, PA-C  LO LOESTRIN FE 1 MG-10 MCG / 10 MCG tablet Take 1 tablet by mouth daily. Patient not taking: Reported on 08/05/2017 05/23/17   Cheral Marker, CNM  magic mouthwash w/lidocaine SOLN Take 5 mLs by mouth 3 (three) times daily as needed for mouth pain. Swish and spit, do not swallow Patient not taking: Reported on 08/05/2017 07/30/17   Triplett, Tammy, PA-C  prenatal vitamin w/FE, FA (PRENATAL 1 + 1) 27-1 MG TABS tablet Take 1 tablet by mouth daily at 12 noon. 08/19/16   Adline Potter, NP    Family History Family History  Problem Relation Age of Onset  . CAD Maternal Grandmother   . Diabetes Maternal Grandmother   . Hypertension Mother   . Diabetes Mother   . Breast cancer Maternal Aunt   . Hypertension Maternal Grandfather   . Stroke Maternal Grandfather     Social History Social History   Tobacco Use  . Smoking status: Former Smoker    Packs/day: 0.25    Years: 10.00    Pack years: 2.50    Types: Cigarettes  Last attempt to quit: 08/29/2012    Years since quitting: 5.0  . Smokeless tobacco: Never Used  Substance Use Topics  . Alcohol use: No  . Drug use: No    Comment: pt denies     Allergies   Patient has no known allergies.   Review of Systems Review of Systems  All other systems reviewed and are negative.    Physical Exam Updated Vital Signs BP 133/78 (BP Location: Left Arm)   Pulse (!) 103   Temp 99.1 F (37.3 C) (Oral)   Resp 18   Wt 98.9 kg (218 lb)   SpO2 100%   BMI 37.42 kg/m   Physical Exam  Constitutional: She is oriented to person, place, and time. She appears well-developed and well-nourished. No distress.  HENT:  Head: Normocephalic and atraumatic.  Neck: Normal range of motion. Neck supple.  Pulmonary/Chest: Effort normal.  Neurological: She is alert and oriented to person, place, and time.  Skin: Skin is warm and  dry. She is not diaphoretic.  There is a swollen, fluctuant area noted to the top of the gluteal cleft.  There is erythema on the overlying skin.  It is tender to the touch.  Nursing note and vitals reviewed.    ED Treatments / Results  Labs (all labs ordered are listed, but only abnormal results are displayed) Labs Reviewed - No data to display  EKG None  Radiology No results found.  Procedures Procedures (including critical care time)  Medications Ordered in ED Medications  lidocaine (PF) (XYLOCAINE) 1 % injection 2 mL (has no administration in time range)     Initial Impression / Assessment and Plan / ED Course  I have reviewed the triage vital signs and the nursing notes.  Pertinent labs & imaging results that were available during my care of the patient were reviewed by me and considered in my medical decision making (see chart for details).  INCISION AND DRAINAGE Performed by: Geoffery Lyonsouglas Sayward Horvath Consent: Verbal consent obtained. Risks and benefits: risks, benefits and alternatives were discussed Type: abscess  Body area: Pilonidal cyst  Anesthesia: local infiltration  Incision was made with a scalpel.  Local anesthetic: lidocaine 1 % without epinephrine  Anesthetic total: 2 ml  Complexity: complex Blunt dissection to break up loculations  Drainage: purulent  Drainage amount: Copious  Packing material: No packing placed  Patient tolerance: Patient tolerated the procedure well with no immediate complications.   Patient with infected pilonidal cyst.  This was incised and drained of a significant quantity of purulent material.  Dressing was applied.  She will be discharged with Keflex and Bactrim and warm soaks.  Final Clinical Impressions(s) / ED Diagnoses   Final diagnoses:  None    ED Discharge Orders    None       Geoffery Lyonselo, Gaven Eugene, MD 09/11/17 0230

## 2017-09-11 NOTE — ED Notes (Signed)
Assisted Dr. Judd Lienelo with patient care

## 2017-09-11 NOTE — Discharge Instructions (Addendum)
Keflex and Bactrim as prescribed.  Perform warm soaks or sitz baths several times daily as we discussed.  Follow-up with general surgery if you would like to entertain the possibility of surgical removal.  The contact information for Dr. Lovell SheehanJenkins has been provided in this discharge summary for you to call and make these arrangements.

## 2017-09-11 NOTE — ED Triage Notes (Signed)
Boil x 3 days (reoccurring) top of  Rt buttocks.

## 2017-09-15 DIAGNOSIS — Z13220 Encounter for screening for lipoid disorders: Secondary | ICD-10-CM | POA: Diagnosis not present

## 2017-09-15 DIAGNOSIS — L0501 Pilonidal cyst with abscess: Secondary | ICD-10-CM | POA: Diagnosis not present

## 2017-09-15 DIAGNOSIS — Z09 Encounter for follow-up examination after completed treatment for conditions other than malignant neoplasm: Secondary | ICD-10-CM | POA: Diagnosis not present

## 2017-09-15 DIAGNOSIS — Z1329 Encounter for screening for other suspected endocrine disorder: Secondary | ICD-10-CM | POA: Diagnosis not present

## 2017-09-15 DIAGNOSIS — Z131 Encounter for screening for diabetes mellitus: Secondary | ICD-10-CM | POA: Diagnosis not present

## 2017-09-15 DIAGNOSIS — E669 Obesity, unspecified: Secondary | ICD-10-CM | POA: Diagnosis not present

## 2017-09-28 DIAGNOSIS — L0501 Pilonidal cyst with abscess: Secondary | ICD-10-CM | POA: Diagnosis not present

## 2017-09-28 DIAGNOSIS — R52 Pain, unspecified: Secondary | ICD-10-CM | POA: Diagnosis not present

## 2017-09-28 DIAGNOSIS — L0291 Cutaneous abscess, unspecified: Secondary | ICD-10-CM | POA: Diagnosis not present

## 2017-10-06 ENCOUNTER — Encounter: Payer: Self-pay | Admitting: Women's Health

## 2017-10-06 ENCOUNTER — Ambulatory Visit: Payer: Medicaid Other | Admitting: Women's Health

## 2017-10-06 ENCOUNTER — Other Ambulatory Visit: Payer: Self-pay

## 2017-10-06 VITALS — BP 135/79 | HR 95 | Ht 64.0 in | Wt 216.0 lb

## 2017-10-06 DIAGNOSIS — B3731 Acute candidiasis of vulva and vagina: Secondary | ICD-10-CM

## 2017-10-06 DIAGNOSIS — L292 Pruritus vulvae: Secondary | ICD-10-CM

## 2017-10-06 DIAGNOSIS — B373 Candidiasis of vulva and vagina: Secondary | ICD-10-CM

## 2017-10-06 LAB — POCT WET PREP (WET MOUNT)
CLUE CELLS WET PREP WHIFF POC: NEGATIVE
Trichomonas Wet Prep HPF POC: ABSENT

## 2017-10-06 MED ORDER — FLUCONAZOLE 150 MG PO TABS: 150.0000 mg | ORAL_TABLET | Freq: Once | ORAL | 0 refills | Status: DC

## 2017-10-06 NOTE — Progress Notes (Signed)
   GYN VISIT Patient name: [name] MRN [id number]  Date of birth: 15-Aug-1985 Chief Complaint:   Vaginal Discharge  History of Present Illness:   [name] is a 32 y.o. [id number] African American female being seen today for report of vaginal d/c w/o odor and vulvar itching/irritation. Has been on antibiotics recently, started after that.      Patient's last menstrual period was 09/22/2017. The current method of family planning is condoms  Last pap 04/10/15. Results were:  neg w/ -HRHPV Review of Systems:   Pertinent items are noted in HPI Denies fever/chills, dizziness, headaches, visual disturbances, fatigue, shortness of breath, chest pain, abdominal pain, vomiting, abnormal vaginal discharge/itching/odor/irritation, problems with periods, bowel movements, urination, or intercourse unless otherwise stated above.  Pertinent History Reviewed:  Reviewed past medical,surgical, social, obstetrical and family history.  Reviewed problem list, medications and allergies. Physical Assessment:   Vitals:   10/06/17 1420  BP: 135/79  Pulse: 95  Weight: 216 lb (98 kg)  Height: 5\' 4"  (1.626 m)  Body mass index is 37.08 kg/m.       Physical Examination:   General appearance: alert, well appearing, and in no distress  Mental status: alert, oriented to person, place, and time  Skin: warm & dry   Cardiovascular: normal heart rate noted  Respiratory: normal respiratory effort, no distress  Abdomen: soft, non-tender   Pelvic: VULVA: vulvar excoriation, vulvar erythema, painted w/ gentian violet VAGINA: normal appearing vagina with normal color and small amt white nonodorou discharge, no lesions, CERVIX: normal appearing cervix without discharge or lesions  Extremities: no edema   Results for orders placed or performed in visit on 10/06/17 (from the past 24 hour(s))  POCT Wet Prep Mellody Drown([place] [place])   Collection Time: 10/06/17  2:57 PM  Result Value Ref Range   Source Wet Prep POC  vaginal    WBC, Wet Prep HPF POC few    Bacteria Wet Prep HPF POC Few Few   BACTERIA WET PREP MORPHOLOGY POC     Clue Cells Wet Prep HPF POC None None   Clue Cells Wet Prep Whiff POC Negative Whiff    Yeast Wet Prep HPF POC Few (A) None   KOH Wet Prep POC     Trichomonas Wet Prep HPF POC Absent Absent    Assessment & Plan:  1) Vulvovaginal candida> painted vulva w/ gentian violet, rx diflucan  Meds:  Meds ordered this encounter  Medications  . fluconazole (DIFLUCAN) 150 MG tablet    Sig: Take 1 tablet (150 mg total) by mouth once for 1 dose. Take 1 pill now, may take 2nd pill in 3 days if needed    Dispense:  2 tablet    Refill:  0    Order Specific Question:   Supervising Provider    Answer:   Lazaro ArmsEURE, LUTHER H [[id number]]    Orders Placed This Encounter  Procedures  . POCT Wet Prep [facility]([place] [place])    Return for As scheduled for physical.  Cheral MarkerKimberly R Aliyana Dlugosz CNM, [facility]NP-BC 10/06/2017 2:58 PM

## 2017-11-02 ENCOUNTER — Other Ambulatory Visit: Payer: Medicaid Other | Admitting: Women's Health

## 2017-11-17 ENCOUNTER — Other Ambulatory Visit: Payer: Medicaid Other | Admitting: Women's Health

## 2017-12-22 ENCOUNTER — Ambulatory Visit (INDEPENDENT_AMBULATORY_CARE_PROVIDER_SITE_OTHER): Payer: Medicaid Other | Admitting: General Surgery

## 2017-12-22 ENCOUNTER — Encounter: Payer: Self-pay | Admitting: General Surgery

## 2017-12-22 ENCOUNTER — Encounter (INDEPENDENT_AMBULATORY_CARE_PROVIDER_SITE_OTHER): Payer: Self-pay

## 2017-12-22 VITALS — BP 150/76 | HR 96 | Temp 98.0°F | Resp 18 | Wt 221.6 lb

## 2017-12-22 DIAGNOSIS — L0591 Pilonidal cyst without abscess: Secondary | ICD-10-CM

## 2017-12-22 NOTE — Patient Instructions (Signed)
Pilonidal Cyst A pilonidal cyst is a fluid-filled sac. It forms beneath the skin near your tailbone, at the top of the crease of your buttocks. A pilonidal cyst that is not large or infected may not cause symptoms or problems. If the cyst becomes irritated or infected, it may fill with pus. This causes pain and swelling (pilonidal abscess). An infected cyst may need to be treated with medicine, drained, or removed. What are the causes? The cause of a pilonidal cyst is not known. One cause may be a hair that grows into your skin (ingrown hair). What increases the risk? Pilonidal cysts are more common in boys and men. Risk factors include:  Having lots of hair near the crease of the buttocks.  Being overweight.  Having a pilonidal dimple.  Wearing tight clothing.  Not bathing or showering frequently.  Sitting for long periods of time.  What are the signs or symptoms? Signs and symptoms of a pilonidal cyst may include:  Redness.  Pain and tenderness.  Warmth.  Swelling.  Pus.  Fever.  How is this diagnosed? Your health care provider may diagnose a pilonidal cyst based on your symptoms and a physical exam. The health care provider may do a blood test to check for infection. If your cyst is draining pus, your health care provider may take a sample of the drainage to be tested at a laboratory. How is this treated? Surgery is the usual treatment for an infected pilonidal cyst. You may also have to take medicines before surgery. The type of surgery you have depends on the size and severity of the infected cyst. The different kinds of surgery include:  Incision and drainage. This is a procedure to open and drain the cyst.  Marsupialization. In this procedure, a large cyst or abscess may be opened and kept open by stitching the edges of the skin to the cyst walls.  Cyst removal. This procedure involves opening the skin and removing all or part of the cyst.  Follow these  instructions at home:  Follow all of your surgeon's instructions carefully if you had surgery.  Take medicines only as directed by your health care provider.  If you were prescribed an antibiotic medicine, finish it all even if you start to feel better.  Keep the area around your pilonidal cyst clean and dry.  Clean the area as directed by your health care provider. Pat the area dry with a clean towel. Do not rub it as this may cause bleeding.  Remove hair from the area around the cyst as directed by your health care provider.  Do not wear tight clothing or sit in one place for long periods of time.  There are many different ways to close and cover an incision, including stitches, skin glue, and adhesive strips. Follow your health care provider's instructions on: ? Incision care. ? Bandage (dressing) changes and removal. ? Incision closure removal. Contact a health care provider if:  You have drainage, redness, swelling, or pain at the site of the cyst.  You have a fever. This information is not intended to replace advice given to you by your health care provider. Make sure you discuss any questions you have with your health care provider. Document Released: 02/13/2000 Document Revised: 07/24/2015 Document Reviewed: 07/05/2013 Elsevier Interactive Patient Education  2018 Elsevier Inc.  

## 2017-12-22 NOTE — H&P (Signed)
[name]; [id number]; 11/05/1985   HPI Patient is a 32 year old black female who was referred to my care by Dr. Durene Cal for evaluation treatment of a pilonidal cyst.  Patient states that earlier this year, she was treated in the emergency room for a pilonidal cyst.  It was incised.  She was placed on antibiotics soon after that.  This did resolve on its own.  Recently, she started having tenderness over the coccyx.  She denies any fevers.  No significant drainage has been noted.  She currently has no pain.  She is not on an antibiotic. Past Medical History:  Diagnosis Date  . BV (bacterial vaginosis) 04/10/2015  . Depression   . HSV infection   . Irregular bleeding 01/17/2013  . Urinary frequency 04/10/2015  . Vaginal discharge 04/19/2013  . Vaginal odor 04/10/2015  . Vitamin D deficiency 04/11/2015  . Yeast infection 04/19/2013    Past Surgical History:  Procedure Laterality Date  . CHOLECYSTECTOMY N/A 09/05/2012   Procedure: LAPAROSCOPIC CHOLECYSTECTOMY;  Surgeon: Emelia Loron, MD;  Location: [facility] OR;  Service: General;  Laterality: N/A;  . ERCP N/A 09/04/2012   Procedure: ENDOSCOPIC RETROGRADE CHOLANGIOPANCREATOGRAPHY (ERCP);  Surgeon: Rachael Fee, MD;  Location: [facility] OR;  Service: Endoscopy;  Laterality: N/A;  . WISDOM TOOTH EXTRACTION      Family History  Problem Relation Age of Onset  . CAD Maternal Grandmother   . Diabetes Maternal Grandmother   . Hypertension Mother   . Diabetes Mother   . Breast cancer Maternal Aunt   . Hypertension Maternal Grandfather   . Stroke Maternal Grandfather     Current Outpatient Medications on File Prior to Visit  Medication Sig Dispense Refill  . ibuprofen (ADVIL,MOTRIN) 800 MG tablet Take 1 tablet (800 mg total) by mouth 3 (three) times daily. 21 tablet 0  . lidocaine (XYLOCAINE) 5 % ointment APP EXT AA 1 TO 4 XD FOR 14 DAYS PRN  0  . LO LOESTRIN FE 1 MG-10 MCG / 10 MCG tablet Take 1 tablet by mouth daily. 3 Package 3  . mupirocin  ointment (BACTROBAN) 2 % APPLY A SMALL AMOUNT TO THE AA TOPIALLY TID FOR 14 DAYS  0  . prenatal vitamin w/FE, FA (PRENATAL 1 + 1) 27-1 MG TABS tablet Take 1 tablet by mouth daily at 12 noon. 30 each 12   No current facility-administered medications on file prior to visit.     No Known Allergies  Social History   Substance and Sexual Activity  Alcohol Use No    Social History   Tobacco Use  Smoking Status Former Smoker  . Packs/day: 0.25  . Years: 10.00  . Pack years: 2.50  . Types: Cigarettes  . Last attempt to quit: 08/29/2012  . Years since quitting: 5.3  Smokeless Tobacco Never Used    Review of Systems  Constitutional: Negative.   HENT: Negative.   Eyes: Negative.   Respiratory: Negative.   Cardiovascular: Negative.   Gastrointestinal: Negative.   Genitourinary: Negative.   Musculoskeletal: Positive for back pain.  Skin: Negative.   Neurological: Negative.   Endo/Heme/Allergies: Negative.   Psychiatric/Behavioral: Negative.     Objective   Vitals:   12/22/17 0940  BP: (!) 150/76  Pulse: 96  Resp: 18  Temp: 98 F (36.7 C)    Physical Exam  Constitutional: She is oriented to person, place, and time. She appears well-developed and well-nourished. No distress.  HENT:  Head: Normocephalic and atraumatic.  Cardiovascular: Normal rate, regular  rhythm and normal heart sounds. Exam reveals no gallop and no friction rub.  No murmur heard. Pulmonary/Chest: Effort normal and breath sounds normal. No stridor. No respiratory distress. She has no wheezes. She has no rales.  Neurological: She is alert and oriented to person, place, and time.  Skin: Skin is warm and dry.  Small area of tenderness and induration over the coccyx.  No erythema or fluctuance noted.  Healed incision scar noted.  Vitals reviewed.  Primary care notes reviewed Assessment  Pilonidal cyst Plan   Patient is scheduled for excision of the pilonidal cyst on 01/04/2018.  The risks and benefits  of the procedure including bleeding, infection, recurrence, and the possibility of wound breakdown were fully explained to the patient, who gave informed consent.

## 2017-12-22 NOTE — Progress Notes (Signed)
[name]; [id number]; 11/09/1985   HPI Patient is a 32-year-old black female who was referred to my care by Dr. Hunter for evaluation treatment of a pilonidal cyst.  Patient states that earlier this year, she was treated in the emergency room for a pilonidal cyst.  It was incised.  She was placed on antibiotics soon after that.  This did resolve on its own.  Recently, she started having tenderness over the coccyx.  She denies any fevers.  No significant drainage has been noted.  She currently has no pain.  She is not on an antibiotic. Past Medical History:  Diagnosis Date  . BV (bacterial vaginosis) 04/10/2015  . Depression   . HSV infection   . Irregular bleeding 01/17/2013  . Urinary frequency 04/10/2015  . Vaginal discharge 04/19/2013  . Vaginal odor 04/10/2015  . Vitamin D deficiency 04/11/2015  . Yeast infection 04/19/2013    Past Surgical History:  Procedure Laterality Date  . CHOLECYSTECTOMY N/A 09/05/2012   Procedure: LAPAROSCOPIC CHOLECYSTECTOMY;  Surgeon: Matthew Wakefield, MD;  Location: [facility] OR;  Service: General;  Laterality: N/A;  . ERCP N/A 09/04/2012   Procedure: ENDOSCOPIC RETROGRADE CHOLANGIOPANCREATOGRAPHY (ERCP);  Surgeon: Daniel P Jacobs, MD;  Location: [facility] OR;  Service: Endoscopy;  Laterality: N/A;  . WISDOM TOOTH EXTRACTION      Family History  Problem Relation Age of Onset  . CAD Maternal Grandmother   . Diabetes Maternal Grandmother   . Hypertension Mother   . Diabetes Mother   . Breast cancer Maternal Aunt   . Hypertension Maternal Grandfather   . Stroke Maternal Grandfather     Current Outpatient Medications on File Prior to Visit  Medication Sig Dispense Refill  . ibuprofen (ADVIL,MOTRIN) 800 MG tablet Take 1 tablet (800 mg total) by mouth 3 (three) times daily. 21 tablet 0  . lidocaine (XYLOCAINE) 5 % ointment APP EXT AA 1 TO 4 XD FOR 14 DAYS PRN  0  . LO LOESTRIN FE 1 MG-10 MCG / 10 MCG tablet Take 1 tablet by mouth daily. 3 Package 3  . mupirocin  ointment (BACTROBAN) 2 % APPLY A SMALL AMOUNT TO THE AA TOPIALLY TID FOR 14 DAYS  0  . prenatal vitamin w/FE, FA (PRENATAL 1 + 1) 27-1 MG TABS tablet Take 1 tablet by mouth daily at 12 noon. 30 each 12   No current facility-administered medications on file prior to visit.     No Known Allergies  Social History   Substance and Sexual Activity  Alcohol Use No    Social History   Tobacco Use  Smoking Status Former Smoker  . Packs/day: 0.25  . Years: 10.00  . Pack years: 2.50  . Types: Cigarettes  . Last attempt to quit: 08/29/2012  . Years since quitting: 5.3  Smokeless Tobacco Never Used    Review of Systems  Constitutional: Negative.   HENT: Negative.   Eyes: Negative.   Respiratory: Negative.   Cardiovascular: Negative.   Gastrointestinal: Negative.   Genitourinary: Negative.   Musculoskeletal: Positive for back pain.  Skin: Negative.   Neurological: Negative.   Endo/Heme/Allergies: Negative.   Psychiatric/Behavioral: Negative.     Objective   Vitals:   12/22/17 0940  BP: (!) 150/76  Pulse: 96  Resp: 18  Temp: 98 F (36.7 C)    Physical Exam  Constitutional: She is oriented to person, place, and time. She appears well-developed and well-nourished. No distress.  HENT:  Head: Normocephalic and atraumatic.  Cardiovascular: Normal rate, regular   rhythm and normal heart sounds. Exam reveals no gallop and no friction rub.  No murmur heard. Pulmonary/Chest: Effort normal and breath sounds normal. No stridor. No respiratory distress. She has no wheezes. She has no rales.  Neurological: She is alert and oriented to person, place, and time.  Skin: Skin is warm and dry.  Small area of tenderness and induration over the coccyx.  No erythema or fluctuance noted.  Healed incision scar noted.  Vitals reviewed.  Primary care notes reviewed Assessment  Pilonidal cyst Plan   Patient is scheduled for excision of the pilonidal cyst on 01/04/2018.  The risks and benefits  of the procedure including bleeding, infection, recurrence, and the possibility of wound breakdown were fully explained to the patient, who gave informed consent.  

## 2017-12-27 NOTE — Patient Instructions (Signed)
[name]  12/27/2017     @PREFPERIOPPHARMACY @   Your procedure is scheduled on  01/04/2018 .  Report to [facility] at  730   A.M.  Call this number if you have problems the morning of surgery:  [phone]   Remember:  Do not eat or drink after midnight.                         Take these medicines the morning of surgery with A SIP OF WATER  None    Do not wear jewelry, make-up or nail polish.  Do not wear lotions, powders, or perfumes, or deodorant.  Do not shave 48 hours prior to surgery.  Men may shave face and neck.  Do not bring valuables to the hospital.  [facility] is not responsible for any belongings or valuables.  Contacts, dentures or bridgework may not be worn into surgery.  Leave your suitcase in the car.  After surgery it may be brought to your room.  For patients admitted to the hospital, discharge time will be determined by your treatment team.  Patients discharged the day of surgery will not be allowed to drive home.   Name and phone number of your driver:   family Special instructions:  None  Please read over the following fact sheets that you were given. Anesthesia Post-op Instructions and Care and Recovery After Surgery      Pilonidal Cyst A pilonidal cyst is a fluid-filled sac. It forms beneath the skin near your tailbone, at the top of the crease of your buttocks. A pilonidal cyst that is not large or infected may not cause symptoms or problems. If the cyst becomes irritated or infected, it may fill with pus. This causes pain and swelling (pilonidal abscess). An infected cyst may need to be treated with medicine, drained, or removed. What are the causes? The cause of a pilonidal cyst is not known. One cause may be a hair that grows into your skin (ingrown hair). What increases the risk? Pilonidal cysts are more common in boys and men. Risk factors include:  Having lots of hair near the crease of the buttocks.  Being  overweight.  Having a pilonidal dimple.  Wearing tight clothing.  Not bathing or showering frequently.  Sitting for long periods of time.  What are the signs or symptoms? Signs and symptoms of a pilonidal cyst may include:  Redness.  Pain and tenderness.  Warmth.  Swelling.  Pus.  Fever.  How is this diagnosed? Your health care provider may diagnose a pilonidal cyst based on your symptoms and a physical exam. The health care provider may do a blood test to check for infection. If your cyst is draining pus, your health care provider may take a sample of the drainage to be tested at a laboratory. How is this treated? Surgery is the usual treatment for an infected pilonidal cyst. You may also have to take medicines before surgery. The type of surgery you have depends on the size and severity of the infected cyst. The different kinds of surgery include:  Incision and drainage. This is a procedure to open and drain the cyst.  Marsupialization. In this procedure, a large cyst or abscess may be opened and kept open by stitching the edges of the skin to the cyst walls.  Cyst removal. This procedure involves opening the skin and removing all or part  of the cyst.  Follow these instructions at home:  Follow all of your surgeon's instructions carefully if you had surgery.  Take medicines only as directed by your health care provider.  If you were prescribed an antibiotic medicine, finish it all even if you start to feel better.  Keep the area around your pilonidal cyst clean and dry.  Clean the area as directed by your health care provider. Pat the area dry with a clean towel. Do not rub it as this may cause bleeding.  Remove hair from the area around the cyst as directed by your health care provider.  Do not wear tight clothing or sit in one place for long periods of time.  There are many different ways to close and cover an incision, including stitches, skin glue, and  adhesive strips. Follow your health care provider's instructions on: ? Incision care. ? Bandage (dressing) changes and removal. ? Incision closure removal. Contact a health care provider if:  You have drainage, redness, swelling, or pain at the site of the cyst.  You have a fever. This information is not intended to replace advice given to you by your health care provider. Make sure you discuss any questions you have with your health care provider. Document Released: 02/13/2000 Document Revised: 07/24/2015 Document Reviewed: 07/05/2013 Elsevier Interactive Patient Education  2018 [employer] Incision and Drainage of a Pilonidal Cyst, Care After Refer to this sheet in the next few weeks. These instructions provide you with information on caring for yourself after your procedure. Your health care provider may also give you more specific instructions. Your treatment has been planned according to current medical practices, but problems sometimes occur. Call your health care provider if you have any problems or questions after your procedure. What can I expect after the procedure? After your procedure, it is typical to have the following:  Pain near or at the surgical area.  Blood-tinged discharge on your wound packing or your bandage (dressing).  Follow these instructions at home:  Take medicines only as directed by your health care provider.  If you were prescribed an antibiotic medicine, finish it all even if you start to feel better.  To prevent constipation: ? Drink enough fluid to keep your urine clear or pale yellow. ? Include lots of whole grains, fruits, and vegetables in your diet.  Do not do activities that irritate or put pressure on your buttocks for about 2 weeks or as directed by your health care provider. These include bike riding, running, and anything that involves a twisting motion.  Do not sit for long periods of time.  Sleep on your side instead of your  back.  Ask your health care provider when you can return to work and resume your usual activities.  Wear loose, cotton underwear.  Keep all follow-up visits as directed by your health care provider. This is important. If you had a surgical cut (incision) and drainage with wound packing:  Return to your health care provider as instructed to have your packing changed or removed.  Keep the incision area dry until your packing has been removed.  After the packing has been removed, you can start taking showers or baths. ? Clean your buttocks area with soap and water. ? Pat the area dry with a soft, clean towel. If you had a marsupialization procedure:  You can start taking showers or baths the day after surgery.  Let the water from the shower or bath moisten your dressing before you  remove it.  After your shower or bath, pat your buttocks area dry with a soft, clean towel and replace your dressing.  Ask your health care provider: ? When you can stop using a dressing. ? When you can start taking showers or baths. If you had a surgical cut (incision) and drainage without packing: Follow instructions from your health care provider about how to take care of your incision. Make sure you:  Wash your hands with soap and water before you change your bandage (dressing). If soap and water are not available, use hand sanitizer.  Change your dressing as told by your health care provider.  Leave stitches (sutures), skin glue, or adhesive strips in place. These skin closures may need to stay in place for 2 weeks or longer. If adhesive strip edges start to loosen and curl up, you may trim the loose edges. Do not remove adhesive strips completely unless your health care provider tells you to do that.  Contact a health care provider if:  Your incision is bleeding.  You have signs of infection at your incision or around the incision. Watch for: ? Drainage. ? Redness. ? Swelling. ? Pain.  There  is a bad smell coming from your incision site.  Your pain medicine is not helping.  You have a fever or chills.  You have muscles aches.  You are dizzy.  You feel generally ill. This information is not intended to replace advice given to you by your health care provider. Make sure you discuss any questions you have with your health care provider. Document Released: 03/18/2006 Document Revised: 07/24/2015 Document Reviewed: 07/05/2013 Elsevier Interactive Patient Education  2018 [employer].  General Anesthesia, Adult General anesthesia is the use of medicines to make a person "go to sleep" (be unconscious) for a medical procedure. General anesthesia is often recommended when a procedure:  Is long.  Requires you to be still or in an unusual position.  Is major and can cause you to lose blood.  Is impossible to do without general anesthesia.  The medicines used for general anesthesia are called general anesthetics. In addition to making you sleep, the medicines:  Prevent pain.  Control your blood pressure.  Relax your muscles.  Tell a health care provider about:  Any allergies you have.  All medicines you are taking, including vitamins, herbs, eye drops, creams, and over-the-counter medicines.  Any problems you or family members have had with anesthetic medicines.  Types of anesthetics you have had in the past.  Any bleeding disorders you have.  Any surgeries you have had.  Any medical conditions you have.  Any history of heart or lung conditions, such as heart failure, sleep apnea, or chronic obstructive pulmonary disease (COPD).  Whether you are pregnant or may be pregnant.  Whether you use tobacco, alcohol, marijuana, or street drugs.  Any history of Financial [employer]anner.  Any history of depression or anxiety. What are the risks? Generally, this is a safe procedure. However, problems may occur, including:  Allergic reaction to anesthetics.  Lung and  heart problems.  Inhaling food or liquids from your stomach into your lungs (aspiration).  Injury to nerves.  Waking up during your procedure and being unable to move (rare).  Extreme agitation or a state of mental confusion (delirium) when you wake up from the anesthetic.  Air in the bloodstream, which can lead to stroke.  These problems are more likely to develop if you are having a major surgery or if you  have an advanced medical condition. You can prevent some of these complications by answering all of your health care provider's questions thoroughly and by following all pre-procedure instructions. General anesthesia can cause side effects, including:  Nausea or vomiting  A sore throat from the breathing tube.  Feeling cold or shivery.  Feeling tired, washed out, or achy.  Sleepiness or drowsiness.  Confusion or agitation.  What happens before the procedure? Staying hydrated Follow instructions from your health care provider about hydration, which may include:  Up to 2 hours before the procedure - you may continue to drink clear liquids, such as water, clear fruit juice, black coffee, and plain tea.  Eating and drinking restrictions Follow instructions from your health care provider about eating and drinking, which may include:  8 hours before the procedure - stop eating heavy meals or foods such as meat, fried foods, or fatty foods.  6 hours before the procedure - stop eating light meals or foods, such as toast or cereal.  6 hours before the procedure - stop drinking milk or drinks that contain milk.  2 hours before the procedure - stop drinking clear liquids.  Medicines  Ask your health care provider about: ? Changing or stopping your regular medicines. This is especially important if you are taking diabetes medicines or blood thinners. ? Taking medicines such as aspirin and ibuprofen. These medicines can thin your blood. Do not take these medicines before your  procedure if your health care provider instructs you not to. ? Taking new dietary supplements or medicines. Do not take these during the week before your procedure unless your health care provider approves them.  If you are told to take a medicine or to continue taking a medicine on the day of the procedure, take the medicine with sips of water. General instructions   Ask if you will be going home the same day, the following day, or after a longer hospital stay. ? Plan to have someone take you home. ? Plan to have someone stay with you for the first 24 hours after you leave the hospital or clinic.  For 3-6 weeks before the procedure, try not to use any tobacco products, such as cigarettes, chewing tobacco, and e-cigarettes.  You may brush your teeth on the morning of the procedure, but make sure to spit out the toothpaste. What happens during the procedure?  You will be given anesthetics through a mask and through an IV tube in one of your veins.  You may receive medicine to help you relax (sedative).  As soon as you are asleep, a breathing tube may be used to help you breathe.  An anesthesia specialist will stay with you throughout the procedure. He or she will help keep you comfortable and safe by continuing to give you medicines and adjusting the amount of medicine that you get. He or she will also watch your blood pressure, pulse, and oxygen levels to make sure that the anesthetics do not cause any problems.  If a breathing tube was used to help you breathe, it will be removed before you wake up. The procedure may vary among health care providers and hospitals. What happens after the procedure?  You will wake up, often slowly, after the procedure is complete, usually in a recovery area.  Your blood pressure, heart rate, breathing rate, and blood oxygen level will be monitored until the medicines you were given have worn off.  You may be given medicine to help you calm down  if you  feel anxious or agitated.  If you will be going home the same day, your health care provider may check to make sure you can stand, drink, and urinate.  Your health care providers will treat your pain and side effects before you go home.  Do not drive for 24 hours if you received a sedative.  You may: ? Feel nauseous and vomit. ? Have a sore throat. ? Have mental slowness. ? Feel cold or shivery. ? Feel sleepy. ? Feel tired. ? Feel sore or achy, even in parts of your body where you did not have surgery. This information is not intended to replace advice given to you by your health care provider. Make sure you discuss any questions you have with your health care provider. Document Released: 05/25/2007 Document Revised: 07/29/2015 Document Reviewed: 01/30/2015 Elsevier Interactive Patient Education  2018 [employer]. General Anesthesia, Adult, Care After These instructions provide you with information about caring for yourself after your procedure. Your health care provider may also give you more specific instructions. Your treatment has been planned according to current medical practices, but problems sometimes occur. Call your health care provider if you have any problems or questions after your procedure. What can I expect after the procedure? After the procedure, it is common to have:  Vomiting.  A sore throat.  Mental slowness.  It is common to feel:  Nauseous.  Cold or shivery.  Sleepy.  Tired.  Sore or achy, even in parts of your body where you did not have surgery.  Follow these instructions at home: For at least 24 hours after the procedure:  Do not: ? Participate in activities where you could fall or become injured. ? Drive. ? Use heavy machinery. ? Drink alcohol. ? Take sleeping pills or medicines that cause drowsiness. ? Make important decisions or sign legal documents. ? Take care of children on your own.  Rest. Eating and drinking  If you vomit,  drink water, juice, or soup when you can drink without vomiting.  Drink enough fluid to keep your urine clear or pale yellow.  Make sure you have little or no nausea before eating solid foods.  Follow the diet recommended by your health care provider. General instructions  Have a responsible adult stay with you until you are awake and alert.  Return to your normal activities as told by your health care provider. Ask your health care provider what activities are safe for you.  Take over-the-counter and prescription medicines only as told by your health care provider.  If you smoke, do not smoke without supervision.  Keep all follow-up visits as told by your health care provider. This is important. Contact a health care provider if:  You continue to have nausea or vomiting at home, and medicines are not helpful.  You cannot drink fluids or start eating again.  You cannot urinate after 8-12 hours.  You develop a skin rash.  You have fever.  You have increasing redness at the site of your procedure. Get help right away if:  You have difficulty breathing.  You have chest pain.  You have unexpected bleeding.  You feel that you are having a life-threatening or urgent problem. This information is not intended to replace advice given to you by your health care provider. Make sure you discuss any questions you have with your health care provider. Document Released: 05/24/2000 Document Revised: 07/21/2015 Document Reviewed: 01/30/2015 Elsevier Interactive Patient Education  [employer].

## 2017-12-28 ENCOUNTER — Other Ambulatory Visit: Payer: Self-pay

## 2017-12-28 ENCOUNTER — Encounter (HOSPITAL_COMMUNITY): Payer: Self-pay

## 2017-12-28 ENCOUNTER — Encounter (HOSPITAL_COMMUNITY)
Admission: RE | Admit: 2017-12-28 | Discharge: 2017-12-28 | Disposition: A | Discharge status: Home / Self Care | Payer: Medicaid Other | Source: Ambulatory Visit | Attending: General Surgery | Admitting: General Surgery

## 2017-12-28 DIAGNOSIS — Z01812 Encounter for preprocedural laboratory examination: Secondary | ICD-10-CM | POA: Insufficient documentation

## 2017-12-28 LAB — HCG, SERUM, QUALITATIVE: Preg, Serum: NEGATIVE

## 2017-12-28 LAB — CBC WITH DIFFERENTIAL/PLATELET
ABS IMMATURE GRANULOCYTES: 0.04 10*3/uL (ref 0.00–0.07)
Basophils Absolute: 0.1 10*3/uL (ref 0.0–0.1)
Basophils Relative: 1 %
Eosinophils Absolute: 0.4 10*3/uL (ref 0.0–0.5)
Eosinophils Relative: 3 %
HCT: 39.6 % (ref 36.0–46.0)
HEMOGLOBIN: 12.3 g/dL (ref 12.0–15.0)
Immature Granulocytes: 0 %
LYMPHS PCT: 33 %
Lymphs Abs: 3.4 10*3/uL (ref 0.7–4.0)
MCH: 28.5 pg (ref 26.0–34.0)
MCHC: 31.1 g/dL (ref 30.0–36.0)
MCV: 91.7 fL (ref 80.0–100.0)
MONO ABS: 0.6 10*3/uL (ref 0.1–1.0)
MONOS PCT: 6 %
NRBC: 0 % (ref 0.0–0.2)
Neutro Abs: 5.9 10*3/uL (ref 1.7–7.7)
Neutrophils Relative %: 57 %
Platelets: 320 10*3/uL (ref 150–400)
RBC: 4.32 MIL/uL (ref 3.87–5.11)
RDW: 13.4 % (ref 11.5–15.5)
WBC: 10.4 10*3/uL (ref 4.0–10.5)

## 2017-12-28 LAB — BASIC METABOLIC PANEL
Anion gap: 7 (ref 5–15)
BUN: 8 mg/dL (ref 6–20)
CHLORIDE: 105 mmol/L (ref 98–111)
CO2: 26 mmol/L (ref 22–32)
Calcium: 9 mg/dL (ref 8.9–10.3)
Creatinine, Ser: 0.68 mg/dL (ref 0.44–1.00)
GFR calc Af Amer: >60 mL/min (ref >60)
GFR calc non Af Amer: >60 mL/min (ref >60)
GLUCOSE: 104 mg/dL — AB (ref 70–99)
Potassium: 4 mmol/L (ref 3.5–5.1)
Sodium: 138 mmol/L (ref 135–145)

## 2017-12-30 ENCOUNTER — Encounter: Payer: Self-pay | Admitting: Women's Health

## 2017-12-30 ENCOUNTER — Other Ambulatory Visit: Payer: Self-pay | Admitting: Obstetrics and Gynecology

## 2017-12-30 ENCOUNTER — Ambulatory Visit: Payer: Medicaid Other | Admitting: Women's Health

## 2017-12-30 ENCOUNTER — Other Ambulatory Visit: Payer: Self-pay | Admitting: Women's Health

## 2017-12-30 ENCOUNTER — Other Ambulatory Visit: Payer: Self-pay

## 2017-12-30 VITALS — BP 135/80 | HR 80 | Ht 66.0 in | Wt 219.0 lb

## 2017-12-30 DIAGNOSIS — Z113 Encounter for screening for infections with a predominantly sexual mode of transmission: Secondary | ICD-10-CM | POA: Diagnosis not present

## 2017-12-30 DIAGNOSIS — Z0001 Encounter for general adult medical examination with abnormal findings: Secondary | ICD-10-CM

## 2017-12-30 DIAGNOSIS — Z01419 Encounter for gynecological examination (general) (routine) without abnormal findings: Secondary | ICD-10-CM

## 2017-12-30 DIAGNOSIS — N760 Acute vaginitis: Secondary | ICD-10-CM | POA: Diagnosis not present

## 2017-12-30 DIAGNOSIS — B9689 Other specified bacterial agents as the cause of diseases classified elsewhere: Secondary | ICD-10-CM

## 2017-12-30 LAB — POCT WET PREP (WET MOUNT)
CLUE CELLS WET PREP WHIFF POC: POSITIVE
Trichomonas Wet Prep HPF POC: ABSENT

## 2017-12-30 MED ORDER — METRONIDAZOLE 500 MG PO TABS: 500.0000 mg | ORAL_TABLET | Freq: Two times a day (BID) | ORAL | 0 refills | Status: DC

## 2017-12-30 NOTE — Progress Notes (Signed)
WELL-WOMAN EXAMINATION Patient name: [name] MRN [id number]  Date of birth: [birth year] Chief Complaint:   Gynecologic Exam  History of Present Illness:   [name] is a 32 y.o. [id number] African American female being seen today for a routine well-woman exam.  Current complaints: vaginal itching/irritation x couple of days, no odor  PCP: someone in [place], can't remember name      does not desire labs Patient's last menstrual period was 12/05/2017 (approximate). The current method of family planning is OCP (estrogen/progesterone) Last pap 04/10/15. Results were: neg w/ -HRHPV Last mammogram: 2017 d/t left breast pain. Results were: normal Last colonoscopy: never. Results were: n/a  Review of Systems:   Pertinent items are noted in HPI Denies any headaches, blurred vision, fatigue, shortness of breath, chest pain, abdominal pain, abnormal vaginal discharge/itching/odor/irritation, problems with periods, bowel movements, urination, or intercourse unless otherwise stated above. Pertinent History Reviewed:  Reviewed past medical,surgical, social and family history.  Reviewed problem list, medications and allergies. Physical Assessment:   Vitals:   12/30/17 0948  BP: 135/80  Pulse: 80  Weight: 219 lb (99.3 kg)  Height: 5\' 6"  (1.676 m)  Body mass index is 35.35 kg/m.        Physical Examination:   General appearance - well appearing, and in no distress  Mental status - alert, oriented to person, place, and time  Psych:  She has a normal mood and affect  Skin - warm and dry, normal color, no suspicious lesions noted  Chest - effort normal, all lung fields clear to auscultation bilaterally  Heart - normal rate and regular rhythm  Neck:  midline trachea, no thyromegaly or nodules  Breasts - breasts appear normal, no suspicious masses, no skin or nipple changes or  axillary nodes  Abdomen - soft, nontender, nondistended, no masses or organomegaly  Pelvic - VULVA:  normal appearing vulva with no masses, tenderness or lesions  VAGINA: normal appearing vagina with normal color and small amt white malodorous discharge, no lesions  CERVIX: normal appearing cervix without discharge or lesions, no CMT  Thin prep pap is not done   UTERUS: uterus is felt to be normal size, shape, consistency and nontender   ADNEXA: No adnexal masses or tenderness noted  Extremities:  No swelling or varicosities noted  Results for orders placed or performed in visit on 12/30/17 (from the past 24 hour(s))  POCT Wet Prep [facility])   Collection Time: 12/30/17 10:05 AM  Result Value Ref Range   Source Wet Prep POC vaginal    WBC, Wet Prep HPF POC mod    Bacteria Wet Prep HPF POC Few Few   BACTERIA WET PREP MORPHOLOGY POC     Clue Cells Wet Prep HPF POC Moderate (A) None   Clue Cells Wet Prep Whiff POC Positive Whiff    Yeast Wet Prep HPF POC None None   KOH Wet Prep POC     Trichomonas Wet Prep HPF POC Absent Absent    Assessment & Plan:  1) Well-Woman Exam  2) BV> Rx metronidazole 500mg  BID x 7d for BV, no sex or etoh while taking   Labs/procedures today: wet prep, gc/ct  Mammogram @33yo  or sooner if problems Colonoscopy @45 -50yo or sooner if problems  Orders Placed This Encounter  Procedures  . GC/Chlamydia Probe Amp  . POCT Wet Prep [facility])    Follow-up: Return in about 1 year (around 12/31/2018) for Pap & physical.  Cheral Marker CNM, WHNP-BC  12/30/2017 10:06 AM

## 2017-12-30 NOTE — Progress Notes (Signed)
Metronidazole Rx for BV escribed.

## 2017-12-30 NOTE — Patient Instructions (Signed)
Bacterial Vaginosis Bacterial vaginosis is a vaginal infection that occurs when the normal balance of bacteria in the vagina is disrupted. It results from an overgrowth of certain bacteria. This is the most common vaginal infection among women ages 15-44. Because bacterial vaginosis increases your risk for STIs (sexually transmitted infections), getting treated can help reduce your risk for chlamydia, gonorrhea, herpes, and HIV (human immunodeficiency virus). Treatment is also important for preventing complications in pregnant women, because this condition can cause an early (premature) delivery. What are the causes? This condition is caused by an increase in harmful bacteria that are normally present in small amounts in the vagina. However, the reason that the condition develops is not fully understood. What increases the risk? The following factors may make you more likely to develop this condition:  Having a new sexual partner or multiple sexual partners.  Having unprotected sex.  Douching.  Having an intrauterine device (IUD).  Smoking.  Drug and alcohol abuse.  Taking certain antibiotic medicines.  Being pregnant.  You cannot get bacterial vaginosis from toilet seats, bedding, swimming pools, or contact with objects around you. What are the signs or symptoms? Symptoms of this condition include:  Grey or white vaginal discharge. The discharge can also be watery or foamy.  A fish-like odor with discharge, especially after sexual intercourse or during menstruation.  Itching in and around the vagina.  Burning or pain with urination.  Some women with bacterial vaginosis have no signs or symptoms. How is this diagnosed? This condition is diagnosed based on:  Your medical history.  A physical exam of the vagina.  Testing a sample of vaginal fluid under a microscope to look for a large amount of bad bacteria or abnormal cells. Your health care provider may use a cotton swab  or a small wooden spatula to collect the sample.  How is this treated? This condition is treated with antibiotics. These may be given as a pill, a vaginal cream, or a medicine that is put into the vagina (suppository). If the condition comes back after treatment, a second round of antibiotics may be needed. Follow these instructions at home: Medicines  Take over-the-counter and prescription medicines only as told by your health care provider.  Take or use your antibiotic as told by your health care provider. Do not stop taking or using the antibiotic even if you start to feel better. General instructions  If you have a female sexual partner, tell her that you have a vaginal infection. She should see her health care provider and be treated if she has symptoms. If you have a female sexual partner, he does not need treatment.  During treatment: ? Avoid sexual activity until you finish treatment. ? Do not douche. ? Avoid alcohol as directed by your health care provider. ? Avoid breastfeeding as directed by your health care provider.  Drink enough water and fluids to keep your urine clear or pale yellow.  Keep the area around your vagina and rectum clean. ? Wash the area daily with warm water. ? Wipe yourself from front to back after using the toilet.  Keep all follow-up visits as told by your health care provider. This is important. How is this prevented?  Do not douche.  Wash the outside of your vagina with warm water only.  Use protection when having sex. This includes latex condoms and dental dams.  Limit how many sexual partners you have. To help prevent bacterial vaginosis, it is best to have sex with just   one partner (monogamous).  Make sure you and your sexual partner are tested for STIs.  Wear cotton or cotton-lined underwear.  Avoid wearing tight pants and pantyhose, especially during summer.  Limit the amount of alcohol that you drink.  Do not use any products that  contain nicotine or tobacco, such as cigarettes and e-cigarettes. If you need help quitting, ask your health care provider.  Do not use illegal drugs. Where to find more information:  Centers for Disease Control and Prevention: www.cdc.gov/std  American Sexual Health Association (ASHA): www.ashastd.org  [place] Department of Health and Human Services, Office on Women's Health: www.womenshealth.gov/ or https://www.womenshealth.gov/a-z-topics/bacterial-vaginosis Contact a health care provider if:  Your symptoms do not improve, even after treatment.  You have more discharge or pain when urinating.  You have a fever.  You have pain in your abdomen.  You have pain during sex.  You have vaginal bleeding between periods. Summary  Bacterial vaginosis is a vaginal infection that occurs when the normal balance of bacteria in the vagina is disrupted.  Because bacterial vaginosis increases your risk for STIs (sexually transmitted infections), getting treated can help reduce your risk for chlamydia, gonorrhea, herpes, and HIV (human immunodeficiency virus). Treatment is also important for preventing complications in pregnant women, because the condition can cause an early (premature) delivery.  This condition is treated with antibiotic medicines. These may be given as a pill, a vaginal cream, or a medicine that is put into the vagina (suppository). This information is not intended to replace advice given to you by your health care provider. Make sure you discuss any questions you have with your health care provider. Document Released: 02/15/2005 Document Revised: 06/21/2016 Document Reviewed: 11/01/2015 Elsevier Interactive Patient Education  2018 Elsevier Inc.  

## 2018-01-01 LAB — GC/CHLAMYDIA PROBE AMP
CHLAMYDIA, DNA PROBE: NEGATIVE
NEISSERIA GONORRHOEAE BY PCR: NEGATIVE

## 2018-01-04 ENCOUNTER — Encounter (HOSPITAL_COMMUNITY)
Admission: RE | Disposition: A | Discharge status: Home / Self Care | Payer: Self-pay | Source: Ambulatory Visit | Attending: General Surgery

## 2018-01-04 ENCOUNTER — Ambulatory Visit (HOSPITAL_COMMUNITY): Payer: Medicaid Other | Admitting: Anesthesiology

## 2018-01-04 ENCOUNTER — Ambulatory Visit (HOSPITAL_COMMUNITY)
Admission: RE | Admit: 2018-01-04 | Discharge: 2018-01-04 | Disposition: A | Discharge status: Home / Self Care | Payer: Medicaid Other | Source: Ambulatory Visit | Attending: General Surgery | Admitting: General Surgery

## 2018-01-04 ENCOUNTER — Encounter (HOSPITAL_COMMUNITY): Payer: Self-pay

## 2018-01-04 DIAGNOSIS — Z79899 Other long term (current) drug therapy: Secondary | ICD-10-CM | POA: Insufficient documentation

## 2018-01-04 DIAGNOSIS — F329 Major depressive disorder, single episode, unspecified: Secondary | ICD-10-CM | POA: Insufficient documentation

## 2018-01-04 DIAGNOSIS — L0591 Pilonidal cyst without abscess: Secondary | ICD-10-CM | POA: Diagnosis not present

## 2018-01-04 DIAGNOSIS — E559 Vitamin D deficiency, unspecified: Secondary | ICD-10-CM | POA: Insufficient documentation

## 2018-01-04 DIAGNOSIS — F17210 Nicotine dependence, cigarettes, uncomplicated: Secondary | ICD-10-CM | POA: Diagnosis not present

## 2018-01-04 HISTORY — PX: PILONIDAL CYST EXCISION: SHX744

## 2018-01-04 SURGERY — EXCISION, SIMPLE PILONIDAL CYST
Anesthesia: General | Site: Back

## 2018-01-04 MED ORDER — SUCCINYLCHOLINE CHLORIDE 20 MG/ML IJ SOLN
INTRAMUSCULAR | Status: DC | PRN
  Administered 2018-01-04: 160 mg via INTRAVENOUS

## 2018-01-04 MED ORDER — FENTANYL CITRATE (PF) 250 MCG/5ML IJ SOLN
INTRAMUSCULAR | Status: AC
  Filled 2018-01-04: qty 5

## 2018-01-04 MED ORDER — HYDROCODONE-ACETAMINOPHEN 7.5-325 MG PO TABS
1.0000 | ORAL_TABLET | Freq: Once | ORAL | Status: AC | PRN
  Administered 2018-01-04: 1 via ORAL
  Filled 2018-01-04: qty 1

## 2018-01-04 MED ORDER — FENTANYL CITRATE (PF) 100 MCG/2ML IJ SOLN
INTRAMUSCULAR | Status: DC | PRN
  Administered 2018-01-04 (×2): 50 ug via INTRAVENOUS
  Administered 2018-01-04: 50 ug
  Administered 2018-01-04: 100 ug via INTRAVENOUS
  Administered 2018-01-04: 50 ug via INTRAVENOUS

## 2018-01-04 MED ORDER — ONDANSETRON HCL 4 MG/2ML IJ SOLN
INTRAMUSCULAR | Status: AC
  Filled 2018-01-04: qty 2

## 2018-01-04 MED ORDER — MIDAZOLAM HCL 5 MG/5ML IJ SOLN
INTRAMUSCULAR | Status: DC | PRN
  Administered 2018-01-04: 2 mg via INTRAVENOUS

## 2018-01-04 MED ORDER — LACTATED RINGERS IV SOLN: INTRAVENOUS | Status: DC

## 2018-01-04 MED ORDER — LIDOCAINE HCL (CARDIAC) PF 50 MG/5ML IV SOSY
PREFILLED_SYRINGE | INTRAVENOUS | Status: DC | PRN
  Administered 2018-01-04: 40 mg via INTRAVENOUS

## 2018-01-04 MED ORDER — CHLORHEXIDINE GLUCONATE CLOTH 2 % EX PADS: 6.0000 | MEDICATED_PAD | Freq: Once | CUTANEOUS | Status: DC

## 2018-01-04 MED ORDER — CEFAZOLIN SODIUM-DEXTROSE 2-4 GM/100ML-% IV SOLN
2.0000 g | INTRAVENOUS | Status: AC
  Administered 2018-01-04: 2 g via INTRAVENOUS
  Filled 2018-01-04: qty 100

## 2018-01-04 MED ORDER — KETOROLAC TROMETHAMINE 30 MG/ML IJ SOLN: 30.0000 mg | Freq: Once | INTRAMUSCULAR | Status: DC

## 2018-01-04 MED ORDER — POVIDONE-IODINE 10 % EX OINT
TOPICAL_OINTMENT | CUTANEOUS | Status: AC
  Filled 2018-01-04: qty 1

## 2018-01-04 MED ORDER — SODIUM CHLORIDE 0.9 % IR SOLN
Status: DC | PRN
  Administered 2018-01-04: 1

## 2018-01-04 MED ORDER — ARTIFICIAL TEARS OPHTHALMIC OINT
TOPICAL_OINTMENT | OPHTHALMIC | Status: AC
  Filled 2018-01-04: qty 3.5

## 2018-01-04 MED ORDER — GLYCOPYRROLATE 0.2 MG/ML IJ SOLN
INTRAMUSCULAR | Status: AC
  Filled 2018-01-04: qty 1

## 2018-01-04 MED ORDER — GLYCOPYRROLATE PF 0.2 MG/ML IJ SOSY
PREFILLED_SYRINGE | INTRAMUSCULAR | Status: DC | PRN
  Administered 2018-01-04: .2 mg via INTRAVENOUS

## 2018-01-04 MED ORDER — KETOROLAC TROMETHAMINE 30 MG/ML IJ SOLN
30.0000 mg | Freq: Once | INTRAMUSCULAR | Status: AC | PRN
  Administered 2018-01-04: 30 mg via INTRAVENOUS
  Filled 2018-01-04: qty 1

## 2018-01-04 MED ORDER — ROCURONIUM BROMIDE 50 MG/5ML IV SOLN
INTRAVENOUS | Status: AC
  Filled 2018-01-04: qty 1

## 2018-01-04 MED ORDER — POVIDONE-IODINE 10 % OINT PACKET
TOPICAL_OINTMENT | CUTANEOUS | Status: DC | PRN
  Administered 2018-01-04: 1 via TOPICAL

## 2018-01-04 MED ORDER — SUGAMMADEX SODIUM 200 MG/2ML IV SOLN
INTRAVENOUS | Status: DC | PRN
  Administered 2018-01-04: 198.6 mg via INTRAVENOUS

## 2018-01-04 MED ORDER — HYDROCODONE-ACETAMINOPHEN 5-325 MG PO TABS: 1.0000 | ORAL_TABLET | ORAL | 0 refills | Status: DC | PRN

## 2018-01-04 MED ORDER — SUGAMMADEX SODIUM 200 MG/2ML IV SOLN
INTRAVENOUS | Status: AC
  Filled 2018-01-04: qty 2

## 2018-01-04 MED ORDER — LIDOCAINE HCL (PF) 1 % IJ SOLN
INTRAMUSCULAR | Status: AC
  Filled 2018-01-04: qty 30

## 2018-01-04 MED ORDER — ROCURONIUM BROMIDE 50 MG/5ML IV SOSY
PREFILLED_SYRINGE | INTRAVENOUS | Status: DC | PRN
  Administered 2018-01-04: 25 mg via INTRAVENOUS

## 2018-01-04 MED ORDER — BUPIVACAINE HCL (PF) 0.5 % IJ SOLN
INTRAMUSCULAR | Status: DC | PRN
  Administered 2018-01-04: 10 mL

## 2018-01-04 MED ORDER — ONDANSETRON HCL 4 MG/2ML IJ SOLN
INTRAMUSCULAR | Status: DC | PRN
  Administered 2018-01-04: 4 mg via INTRAVENOUS

## 2018-01-04 MED ORDER — MEPERIDINE HCL 50 MG/ML IJ SOLN: 6.2500 mg | INTRAMUSCULAR | Status: DC | PRN

## 2018-01-04 MED ORDER — ONDANSETRON HCL 4 MG/2ML IJ SOLN: 4.0000 mg | Freq: Once | INTRAMUSCULAR | Status: DC | PRN

## 2018-01-04 MED ORDER — LIDOCAINE HCL (PF) 1 % IJ SOLN
INTRAMUSCULAR | Status: AC
  Filled 2018-01-04: qty 5

## 2018-01-04 MED ORDER — SUCCINYLCHOLINE CHLORIDE 20 MG/ML IJ SOLN
INTRAMUSCULAR | Status: AC
  Filled 2018-01-04: qty 2

## 2018-01-04 MED ORDER — PROPOFOL 10 MG/ML IV BOLUS
INTRAVENOUS | Status: DC | PRN
  Administered 2018-01-04: 200 mg via INTRAVENOUS

## 2018-01-04 MED ORDER — BUPIVACAINE HCL (PF) 0.5 % IJ SOLN
INTRAMUSCULAR | Status: AC
  Filled 2018-01-04: qty 30

## 2018-01-04 MED ORDER — HYDROMORPHONE HCL 1 MG/ML IJ SOLN
0.2500 mg | INTRAMUSCULAR | Status: DC | PRN
  Administered 2018-01-04: 0.5 mg via INTRAVENOUS
  Filled 2018-01-04: qty 0.5

## 2018-01-04 MED ORDER — MIDAZOLAM HCL 2 MG/2ML IJ SOLN
INTRAMUSCULAR | Status: AC
  Filled 2018-01-04: qty 2

## 2018-01-04 MED ORDER — CEFAZOLIN SODIUM-DEXTROSE 2-3 GM-%(50ML) IV SOLR
INTRAVENOUS | Status: DC | PRN
  Administered 2018-01-04: 2 g via INTRAVENOUS

## 2018-01-04 SURGICAL SUPPLY — 25 items
CLOTH BEACON ORANGE TIMEOUT ST (SAFETY) ×3 IMPLANT
COVER LIGHT HANDLE STERIS (MISCELLANEOUS) ×6 IMPLANT
DECANTER SPIKE VIAL GLASS SM (MISCELLANEOUS) ×3 IMPLANT
ELECT REM PT RETURN 9FT ADLT (ELECTROSURGICAL) ×3
ELECTRODE REM PT RTRN 9FT ADLT (ELECTROSURGICAL) ×1 IMPLANT
GAUZE SPONGE 4X4 12PLY STRL (GAUZE/BANDAGES/DRESSINGS) ×6 IMPLANT
GAUZE XEROFORM 5X9 LF (GAUZE/BANDAGES/DRESSINGS) IMPLANT
GLOVE BIOGEL PI IND STRL 7.0 (GLOVE) ×1 IMPLANT
GLOVE BIOGEL PI INDICATOR 7.0 (GLOVE) ×2
GLOVE SURG SS PI 7.5 STRL IVOR (GLOVE) ×6 IMPLANT
GOWN STRL REUS W/TWL LRG LVL3 (GOWN DISPOSABLE) ×6 IMPLANT
KIT TURNOVER KIT A (KITS) ×3 IMPLANT
MANIFOLD NEPTUNE II (INSTRUMENTS) ×3 IMPLANT
NEEDLE HYPO 25X1 1.5 SAFETY (NEEDLE) ×3 IMPLANT
NS IRRIG 1000ML POUR BTL (IV SOLUTION) ×3 IMPLANT
PACK MINOR (CUSTOM PROCEDURE TRAY) ×3 IMPLANT
PAD ABD 5X9 TENDERSORB (GAUZE/BANDAGES/DRESSINGS) ×3 IMPLANT
PAD ARMBOARD 7.5X6 YLW CONV (MISCELLANEOUS) ×3 IMPLANT
SET BASIN LINEN APH (SET/KITS/TRAYS/PACK) ×3 IMPLANT
SPONGE GAUZE 2X2 8PLY STER LF (GAUZE/BANDAGES/DRESSINGS) ×1
SPONGE GAUZE 2X2 8PLY STRL LF (GAUZE/BANDAGES/DRESSINGS) ×2 IMPLANT
SPONGE LAP 18X18 X RAY DECT (DISPOSABLE) ×3 IMPLANT
SUT PROLENE 2 0 FS (SUTURE) ×6 IMPLANT
SYR CONTROL 10ML LL (SYRINGE) ×3 IMPLANT
TAPE CLOTH SURG 4X10 WHT LF (GAUZE/BANDAGES/DRESSINGS) ×3 IMPLANT

## 2018-01-04 NOTE — Anesthesia Procedure Notes (Signed)
Procedure Name: Intubation Date/Time: 01/04/2018 9:19 AM Performed by: Pernell Dupre, Mikiala Fugett A, CRNA Pre-anesthesia Checklist: Patient identified, Patient being monitored, Timeout performed, Emergency Drugs available and Suction available Patient Re-evaluated:Patient Re-evaluated prior to induction Oxygen Delivery Method: Circle system utilized Preoxygenation: Pre-oxygenation with 100% oxygen Induction Type: IV induction Ventilation: Mask ventilation without difficulty Laryngoscope Size: 3 and Miller Grade View: Grade I Tube type: Oral Tube size: 7.0 mm Number of attempts: 1 Airway Equipment and Method: Stylet Placement Confirmation: ETT inserted through vocal cords under direct vision,  positive ETCO2 and breath sounds checked- equal and bilateral Secured at: 21 cm Tube secured with: Tape Dental Injury: Teeth and Oropharynx as per pre-operative assessment

## 2018-01-04 NOTE — Op Note (Signed)
Patient:  [name]  DOB:  [birth year]  MRN:  [id number]   Preop Diagnosis: Pilonidal cyst  Postop Diagnosis: Same  Procedure: Excision of pilonidal cyst  Surgeon: Franky Macho, MD  Anes: General endotracheal  Indications: Patient is a 32 year old black female who presents with a pilonidal cyst.  The risks and benefits of the procedure including bleeding, infection, and recurrence of the cyst were fully explained to the patient, who gave informed consent.  Procedure note: The patient was placed in the right lateral decubitus position after induction of general endotracheal anesthesia.  The pilonidal region was prepped and draped using usual sterile technique with Betadine.  Surgical site confirmation was performed.  Elliptical incision was made around the pilonidal region.  The incision was approximately 3 to 4 cm in its greatest diameter.  Dissection was taken down to the coccyx.  No abscess cavity was found.  Some granulation tissue was present in the subcutaneous tissue.  This was all removed without difficulty.  It was sent to pathology for the examination.  A bleeding was controlled using Bovie electrocautery.  0.5% Sensorcaine was instilled into the wound.  The subcutaneous layer was reapproximated using a 3-0 Vicryl interrupted suture.  The skin was closed using a 2-0 Prolene interrupted suture.  Betadine ointment and dry sterile dressing were applied.  All tape and needle counts were correct at the end the procedure.  Patient was extubated in the operating room and transferred to PACU in stable condition.  Complications: None  EBL: Minimal  Specimen: Pilonidal cyst

## 2018-01-04 NOTE — Transfer of Care (Signed)
Immediate Anesthesia Transfer of Care Note  Patient: [name]  Procedure(s) Performed: CYST EXCISION PILONIDAL SIMPLE (N/A )  Patient Location: PACU  Anesthesia Type:General  Level of Consciousness: awake, oriented and patient cooperative  Airway & Oxygen Therapy: Patient Spontanous Breathing  Post-op Assessment: Report given to RN and Post -op Vital signs reviewed and stable  Post vital signs: Reviewed and stable  Last Vitals:  Vitals Value Taken Time  BP 129/66 01/04/2018 10:03 AM  Temp 36.3 C 01/04/2018 10:01 AM  Pulse 108 01/04/2018 10:09 AM  Resp 16 01/04/2018 10:09 AM  SpO2 98 % 01/04/2018 10:09 AM  Vitals shown include unvalidated device data.  Last Pain:  Vitals:   01/04/18 1001  TempSrc:   PainSc: 8          Complications: No apparent anesthesia complications

## 2018-01-04 NOTE — Interval H&P Note (Signed)
History and Physical Interval Note:  01/04/2018 8:21 AM  [name]  has presented today for surgery, with the diagnosis of pilonidal cyst  The various methods of treatment have been discussed with the patient and family. After consideration of risks, benefits and other options for treatment, the patient has consented to  Procedure(s): CYST EXCISION PILONIDAL SIMPLE (N/A) as a surgical intervention .  The patient's history has been reviewed, patient examined, no change in status, stable for surgery.  I have reviewed the patient's chart and labs.  Questions were answered to the patient's satisfaction.     Franky Macho

## 2018-01-04 NOTE — Anesthesia Postprocedure Evaluation (Signed)
Anesthesia Post Note  Patient: [name]  Procedure(s) Performed: CYST EXCISION PILONIDAL SIMPLE (N/A Back)  Patient location during evaluation: PACU Anesthesia Type: General Level of consciousness: awake and alert and oriented Pain management: pain level controlled Vital Signs Assessment: post-procedure vital signs reviewed and stable Respiratory status: spontaneous breathing Cardiovascular status: stable Postop Assessment: no apparent nausea or vomiting Anesthetic complications: no     Last Vitals:  Vitals:   01/04/18 1030 01/04/18 1041  BP: 133/84 123/78  Pulse: 72 93  Resp: 16 19  Temp:    SpO2: 95% 100%    Last Pain:  Vitals:   01/04/18 1041  TempSrc:   PainSc: 4                  Gianni Mihalik A

## 2018-01-04 NOTE — Anesthesia Preprocedure Evaluation (Signed)
Anesthesia Evaluation  Patient identified by MRN, date of birth, ID band Patient awake    Reviewed: Allergy & Precautions, H&P , NPO status , Patient's Chart, lab work & pertinent test results  Airway Mallampati: II  TM Distance: >3 FB Neck ROM: full    Dental no notable dental hx.    Pulmonary neg pulmonary ROS, former smoker,    Pulmonary exam normal breath sounds clear to auscultation       Cardiovascular Exercise Tolerance: Good negative cardio ROS   Rhythm:regular Rate:Normal     Neuro/Psych PSYCHIATRIC DISORDERS Depression negative neurological ROS     GI/Hepatic negative GI ROS, Neg liver ROS,   Endo/Other  negative endocrine ROS  Renal/GU negative Renal ROS  negative genitourinary   Musculoskeletal   Abdominal   Peds  Hematology negative hematology ROS (+)   Anesthesia Other Findings   Reproductive/Obstetrics negative OB ROS                             Anesthesia Physical Anesthesia Plan  ASA: II  Anesthesia Plan: General   Post-op Pain Management:    Induction:   PONV Risk Score and Plan:   Airway Management Planned:   Additional Equipment:   Intra-op Plan:   Post-operative Plan:   Informed Consent: I have reviewed the patients History and Physical, chart, labs and discussed the procedure including the risks, benefits and alternatives for the proposed anesthesia with the patient or authorized representative who has indicated his/her understanding and acceptance.   Dental Advisory Given  Plan Discussed with: CRNA  Anesthesia Plan Comments:         Anesthesia Quick Evaluation

## 2018-01-04 NOTE — Discharge Instructions (Signed)
Pilonidal Cyst A pilonidal cyst is a fluid-filled sac. It forms beneath the skin near your tailbone, at the top of the crease of your buttocks. A pilonidal cyst that is not large or infected may not cause symptoms or problems. If the cyst becomes irritated or infected, it may fill with pus. This causes pain and swelling (pilonidal abscess). An infected cyst may need to be treated with medicine, drained, or removed. What are the causes? The cause of a pilonidal cyst is not known. One cause may be a hair that grows into your skin (ingrown hair). What increases the risk? Pilonidal cysts are more common in boys and men. Risk factors include:  Having lots of hair near the crease of the buttocks.  Being overweight.  Having a pilonidal dimple.  Wearing tight clothing.  Not bathing or showering frequently.  Sitting for long periods of time.  What are the signs or symptoms? Signs and symptoms of a pilonidal cyst may include:  Redness.  Pain and tenderness.  Warmth.  Swelling.  Pus.  Fever.  How is this diagnosed? Your health care provider may diagnose a pilonidal cyst based on your symptoms and a physical exam. The health care provider may do a blood test to check for infection. If your cyst is draining pus, your health care provider may take a sample of the drainage to be tested at a laboratory. How is this treated? Surgery is the usual treatment for an infected pilonidal cyst. You may also have to take medicines before surgery. The type of surgery you have depends on the size and severity of the infected cyst. The different kinds of surgery include:  Incision and drainage. This is a procedure to open and drain the cyst.  Marsupialization. In this procedure, a large cyst or abscess may be opened and kept open by stitching the edges of the skin to the cyst walls.  Cyst removal. This procedure involves opening the skin and removing all or part of the cyst.  Follow these  instructions at home:  Follow all of your surgeon's instructions carefully if you had surgery.  Take medicines only as directed by your health care provider.  If you were prescribed an antibiotic medicine, finish it all even if you start to feel better.  Keep the area around your pilonidal cyst clean and dry.  Clean the area as directed by your health care provider. Pat the area dry with a clean towel. Do not rub it as this may cause bleeding.  Remove hair from the area around the cyst as directed by your health care provider.  Do not wear tight clothing or sit in one place for long periods of time.  There are many different ways to close and cover an incision, including stitches, skin glue, and adhesive strips. Follow your health care provider's instructions on: ? Incision care. ? Bandage (dressing) changes and removal. ? Incision closure removal. Contact a health care provider if:  You have drainage, redness, swelling, or pain at the site of the cyst.  You have a fever. This information is not intended to replace advice given to you by your health care provider. Make sure you discuss any questions you have with your health care provider. Document Released: 02/13/2000 Document Revised: 07/24/2015 Document Reviewed: 07/05/2013 Elsevier Interactive Patient Education  2018 Elsevier Inc.  

## 2018-01-05 ENCOUNTER — Encounter (HOSPITAL_COMMUNITY): Payer: Self-pay | Admitting: General Surgery

## 2018-01-10 ENCOUNTER — Telehealth: Payer: Self-pay | Admitting: Women's Health

## 2018-01-10 NOTE — Telephone Encounter (Signed)
Pt was here couple of weeks ago and is not better needs to talk to a nurse

## 2018-01-10 NOTE — Telephone Encounter (Signed)
Patient states she is still having vaginal discomfort and doesn't think was she has is better.  States she did not take the Diflucan as she did not know if she should. Instructed the patient to take and repeat in 3 days if not better.  Verbalized understanding and will try.

## 2018-01-12 ENCOUNTER — Telehealth: Payer: Self-pay | Admitting: Emergency Medicine

## 2018-01-12 NOTE — Telephone Encounter (Signed)
Patient called and stated she is having a pusy discharge and wanted yo know if it is normal. She stated she isn't running a fever or vomiting, or having anyother symptoms. I notified her to wash the area with antibacteria soap and to keep the area dry and if she begins running a fever to go to the ER. Patient stated she understood and that she would  keep her appointment for 01/17/18

## 2018-01-17 ENCOUNTER — Encounter: Payer: Self-pay | Admitting: General Surgery

## 2018-01-17 ENCOUNTER — Ambulatory Visit (INDEPENDENT_AMBULATORY_CARE_PROVIDER_SITE_OTHER): Payer: Self-pay | Admitting: General Surgery

## 2018-01-17 VITALS — BP 115/73 | HR 75 | Temp 96.9°F | Resp 18 | Wt 219.8 lb

## 2018-01-17 DIAGNOSIS — Z09 Encounter for follow-up examination after completed treatment for conditions other than malignant neoplasm: Secondary | ICD-10-CM

## 2018-01-17 NOTE — Progress Notes (Signed)
Subjective:     Sonia LovelessShavonne J Montgomery  Here for follow-up status post excision of pilonidal cyst.  Patient doing well.  She has had some serosanguineous drainage from the incision.  No fever chills have been noted.  No purulent drainage has been noted.  Is somewhat tender to touch when pressure is applied. Objective:    BP 115/73 (BP Location: Left Arm, Patient Position: Sitting, Cuff Size: Large)   Pulse 75   Temp (!) 96.9 F (36.1 C) (Temporal)   Resp 18   Wt 219 lb 12.8 oz (99.7 kg)   BMI 35.48 kg/m   General:  alert, cooperative and no distress  Incision with small punctate wound present inferiorly.  No purulent drainage noted.  Silver nitrate applied.  Remaining sutures removed. Final pathology consistent with diagnosis.     Assessment:    Wound healing well without infection.  Small punctate opening present.    Plan:   Keep wound clean and dry.  Follow-up here in 1 week for wound check.

## 2018-01-24 ENCOUNTER — Other Ambulatory Visit: Payer: Self-pay | Admitting: Adult Health

## 2018-01-24 ENCOUNTER — Encounter: Payer: Self-pay | Admitting: General Surgery

## 2018-01-24 ENCOUNTER — Ambulatory Visit (INDEPENDENT_AMBULATORY_CARE_PROVIDER_SITE_OTHER): Payer: Self-pay | Admitting: General Surgery

## 2018-01-24 VITALS — BP 130/83 | HR 89 | Temp 97.8°F | Resp 18 | Wt 221.8 lb

## 2018-01-24 DIAGNOSIS — Z09 Encounter for follow-up examination after completed treatment for conditions other than malignant neoplasm: Secondary | ICD-10-CM

## 2018-01-24 MED ORDER — HYDROCODONE-ACETAMINOPHEN 5-325 MG PO TABS: 1.0000 | ORAL_TABLET | ORAL | 0 refills | Status: DC | PRN

## 2018-01-24 NOTE — Progress Notes (Signed)
Subjective:     Sonia LovelessShavonne J Montgomery  Here for follow-up wound check.  Still with some serosanguineous drainage from pilonidal cyst region.  No fever or chills have been noted.  Intermittent pain with pressure. Objective:    BP 130/83 (BP Location: Left Arm, Patient Position: Sitting, Cuff Size: Large)   Pulse 89   Temp 97.8 F (36.6 C) (Temporal)   Resp 18   Wt 221 lb 12.8 oz (100.6 kg)   BMI 35.80 kg/m   General:  alert, cooperative and no distress  Pilonidal cyst incision site with small punctate wound present.  No induration or erythema noted.  Silver nitrate applied.     Assessment:    Doing well postoperatively. Small punctate opening present.    Plan:   I did reorder her hydrocodone for pain.  Follow-up here in 2 weeks for wound check.

## 2018-02-07 ENCOUNTER — Ambulatory Visit (INDEPENDENT_AMBULATORY_CARE_PROVIDER_SITE_OTHER): Payer: Self-pay | Admitting: General Surgery

## 2018-02-07 ENCOUNTER — Encounter: Payer: Self-pay | Admitting: General Surgery

## 2018-02-07 VITALS — BP 142/74 | HR 95 | Temp 96.2°F | Resp 18 | Wt 220.4 lb

## 2018-02-07 DIAGNOSIS — Z09 Encounter for follow-up examination after completed treatment for conditions other than malignant neoplasm: Secondary | ICD-10-CM

## 2018-02-07 NOTE — Progress Notes (Signed)
Subjective:     [name]  Here for follow-up wound check.  Patient states the pain in the incisional area has decreased significantly.  She has had no purulent drainage.  No bloody drainage has been noted. Objective:    BP (!) 142/74 (BP Location: Left Arm, Patient Position: Sitting, Cuff Size: Normal)   Pulse 95   Temp (!) 96.2 F (35.7 C) (Temporal)   Resp 18   Wt 220 lb 6.4 oz (100 kg)   BMI 35.57 kg/m   General:  alert, cooperative and no distress  Punctate hole along the lower portion of her pilonidal cyst excision site has healed.  No induration or drainage is noted.     Assessment:    Doing well postoperatively. Punctate superficial wound has healed.   Plan:   Follow-up as needed.  May return as needed.

## 2018-02-08 ENCOUNTER — Telehealth: Payer: Self-pay | Admitting: Obstetrics & Gynecology

## 2018-02-08 NOTE — Telephone Encounter (Signed)
Patient called stating that she would like a call back from a nurse, patient did not state the reason why. Please contact pt

## 2018-02-09 ENCOUNTER — Ambulatory Visit: Payer: Medicaid Other | Admitting: Advanced Practice Midwife

## 2018-02-09 ENCOUNTER — Encounter: Payer: Self-pay | Admitting: Advanced Practice Midwife

## 2018-02-09 VITALS — BP 136/80 | HR 93 | Ht 66.0 in | Wt 221.0 lb

## 2018-02-09 DIAGNOSIS — N760 Acute vaginitis: Secondary | ICD-10-CM | POA: Diagnosis not present

## 2018-02-09 MED ORDER — NYSTATIN-TRIAMCINOLONE 100000-0.1 UNIT/GM-% EX OINT: 1.0000 "application " | TOPICAL_OINTMENT | Freq: Two times a day (BID) | CUTANEOUS | 0 refills | Status: DC

## 2018-02-09 NOTE — Progress Notes (Signed)
[facility]  Patient name: [name] MRN [id number]  Date of birth: 10-25-1985  CC & HPI:  [name] is a 32 y.o. African American female presenting today for vaginal discharge.  Was treated for BV and yeast on 11/1.  Feels like sx went away, but not completely, worse in the last week or two. Wore tight jeans which exacerbated symptoms. Feels like she has to "push urine out" at end of void.  Had surgery to remove pilonidal cyst on 11/12, took antibioticAlso, she started using Dial soap instead of unscented Dove.  Pertinent History Reviewed:  Medical & Surgical Hx:   Past Medical History:  Diagnosis Date  . BV (bacterial vaginosis) 04/10/2015  . Depression   . HSV infection   . Irregular bleeding 01/17/2013  . Urinary frequency 04/10/2015  . Vaginal discharge 04/19/2013  . Vaginal odor 04/10/2015  . Vitamin D deficiency 04/11/2015  . Yeast infection 04/19/2013   Past Surgical History:  Procedure Laterality Date  . CHOLECYSTECTOMY N/A 09/05/2012   Procedure: LAPAROSCOPIC CHOLECYSTECTOMY;  Surgeon: Emelia LoronMatthew Wakefield, MD;  [facility];  Service: General;  Laterality: N/A;  . ERCP N/A 09/04/2012   Procedure: ENDOSCOPIC RETROGRADE CHOLANGIOPANCREATOGRAPHY (ERCP);  Surgeon: Rachael Feeaniel P Jacobs, MD;  [facility];  Service: Endoscopy;  Laterality: N/A;  . PILONIDAL CYST EXCISION N/A 01/04/2018   Procedure: CYST EXCISION PILONIDAL SIMPLE;  Surgeon: Franky MachoJenkins, Mark, MD;  Location: [facility]S;  Service: General;  Laterality: N/A;  low back  . WISDOM TOOTH EXTRACTION     Family History  Problem Relation Age of Onset  . CAD Maternal Grandmother   . Diabetes Maternal Grandmother   . Hypertension Mother   . Diabetes Mother   . Breast cancer Maternal Aunt   . Hypertension Maternal Grandfather   . Stroke Maternal Grandfather     Current Outpatient Medications:  .  HYDROcodone-acetaminophen (NORCO) 5-325 MG tablet, Take 1 tablet by mouth every 4 (four) hours as needed for  moderate pain., Disp: 30 tablet, Rfl: 0 .  LO LOESTRIN FE 1 MG-10 MCG / 10 MCG tablet, Take 1 tablet by mouth daily., Disp: 3 Package, Rfl: 3 .  Prenatal Vit-Fe Fumarate-FA (PREPLUS) 27-1 MG TABS, Take 1 tablet by mouth daily., Disp: 30 tablet, Rfl: 12 .  acetaminophen (TYLENOL) 325 MG tablet, Take 650 mg by mouth every 6 (six) hours as needed for moderate pain or headache., Disp: , Rfl:  .  diphenhydrAMINE (BENADRYL) 25 mg capsule, Take 50 mg by mouth daily as needed for allergies., Disp: , Rfl:  .  fluconazole (DIFLUCAN) 150 MG tablet, TAKE 1 TABLET BY MOUTH NOW. MAY TAKE 2ND DOSE IN 3 DAYS AS NEEDED (Patient not taking: Reported on 02/09/2018), Disp: 2 tablet, Rfl: 0 .  nystatin-triamcinolone ointment (MYCOLOG), Apply 1 application topically 2 (two) times daily., Disp: 30 g, Rfl: 0 Social History: Reviewed -  reports that she quit smoking about 5 years ago. Her smoking use included cigarettes. She has a 2.50 pack-year smoking history. She has never used smokeless tobacco.  Review of Systems:   Constitutional: Negative for fever and chills Eyes: Negative for visual disturbances Respiratory: Negative for shortness of breath, dyspnea Cardiovascular: Negative for chest pain or palpitations  Gastrointestinal: Negative for vomiting, diarrhea and constipation; no abdominal pain Genitourinary: Negative for dysuria and urgency, Musculoskeletal: Negative for back pain, joint pain, myalgias  Neurological: Negative for dizziness and headaches    Objective Findings:    Physical Examination: Vitals:  02/09/18 1516  BP: 136/80  Pulse: 93   General appearance - well appearing, and in no distress Mental status - alert, oriented to person, place, and time Chest:  Normal respiratory effort Heart - normal rate and regular rhythm Abdomen:  Soft, nontender Pelvic:Vulva normal, not erytheomous.  SSE:  Vaginal discharge scant and white, normal appearing without odor.  Wet prep completely negative. .   NuSwab collected. Musculoskeletal:  Normal range of motion without pain Extremities:  No edema    No results found for this or any previous visit (from the past 24 hour(s)).    Assessment & Plan:  A:   Vaginal irriation, ? why P:  Nuswab and urine culture  Try steroid cream, tx cultures if anything shows   No follow-ups on file.  Jacklyn Shell CNM 02/09/2018 3:51 PM

## 2018-02-09 NOTE — Telephone Encounter (Signed)
Attempted to call pt to inform her that she will need an appt before a med refill. No answer/voicemail not set up

## 2018-02-09 NOTE — Telephone Encounter (Signed)
Attempted to return pts call but no answer/voicemail not set up

## 2018-02-09 NOTE — Addendum Note (Signed)
Addended by: Sherre LainASH, AMANDA A on: 02/09/2018 04:02 PM   Modules accepted: Orders

## 2018-02-09 NOTE — Telephone Encounter (Signed)
Pt called stating that she doesn't feel like her bacterial infection has cleared up despite taking the medication. She states she completed the meds for the BV and yeast. She is requesting that both meds be sent in again with refills. She states that Dr Emelda Fearferguson has done this for her in the past. Advised that I would send her request to a provider. Pt verbalized understanding.

## 2018-02-12 LAB — NUSWAB VAGINITIS PLUS (VG+)
CHLAMYDIA TRACHOMATIS, NAA: NEGATIVE
Candida albicans, NAA: NEGATIVE
Candida glabrata, NAA: NEGATIVE
NEISSERIA GONORRHOEAE, NAA: NEGATIVE
Trich vag by NAA: NEGATIVE

## 2018-02-13 LAB — URINE CULTURE

## 2018-04-13 ENCOUNTER — Ambulatory Visit: Payer: Medicaid Other | Admitting: Obstetrics and Gynecology

## 2018-04-13 ENCOUNTER — Encounter: Payer: Self-pay | Admitting: Obstetrics and Gynecology

## 2018-04-13 VITALS — BP 116/66 | HR 90 | Ht 66.0 in | Wt 219.0 lb

## 2018-04-13 DIAGNOSIS — N760 Acute vaginitis: Secondary | ICD-10-CM | POA: Diagnosis not present

## 2018-04-13 DIAGNOSIS — B9689 Other specified bacterial agents as the cause of diseases classified elsewhere: Secondary | ICD-10-CM | POA: Diagnosis not present

## 2018-04-13 MED ORDER — METRONIDAZOLE 0.75 % VA GEL: 1.0000 | Freq: Every day | VAGINAL | 1 refills | Status: DC

## 2018-04-13 NOTE — Progress Notes (Signed)
Patient ID: [name], female   DOB: January 01, 1986, 33 y.o.   MRN: [id number]    [facility] Clinic Visit  @DATE @            Patient name: [name] MRN [id number]  Date of birth: May 29, 1985  CC & HPI:  [name] is a 33 y.o. female presenting today for recurrent episodes of vaginal odor and perceived increased discharge. She lost her pack of BCPs and has not taken them all month. Agrees to restart BCPs. She has used Metrogel before, but not in a long time. The patient denies fever, chills or any other symptoms or complaints at this time.   ROS:  ROS + vaginal odor + discharge - fever - chills  All systems are negative except as noted in the HPI and PMH.   Pertinent History Reviewed:   Reviewed:  Medical         Past Medical History:  Diagnosis Date  . BV (bacterial vaginosis) 04/10/2015  . Depression   . HSV infection   . Irregular bleeding 01/17/2013  . Urinary frequency 04/10/2015  . Vaginal discharge 04/19/2013  . Vaginal odor 04/10/2015  . Vitamin D deficiency 04/11/2015  . Yeast infection 04/19/2013                              Surgical Hx:    Past Surgical History:  Procedure Laterality Date  . CHOLECYSTECTOMY N/A 09/05/2012   Procedure: LAPAROSCOPIC CHOLECYSTECTOMY;  Surgeon: Emelia Loron, MD;  Location: [facility] OR;  Service: General;  Laterality: N/A;  . ERCP N/A 09/04/2012   Procedure: ENDOSCOPIC RETROGRADE CHOLANGIOPANCREATOGRAPHY (ERCP);  Surgeon: Rachael Fee, MD;  Location: [facility] OR;  Service: Endoscopy;  Laterality: N/A;  . PILONIDAL CYST EXCISION N/A 01/04/2018   Procedure: CYST EXCISION PILONIDAL SIMPLE;  Surgeon: Franky Macho, MD;  Location: [facility] ORS;  Service: General;  Laterality: N/A;  low back  . WISDOM TOOTH EXTRACTION     Medications: Reviewed & Updated - see associated section                       Current Outpatient Medications:  .  LO LOESTRIN FE 1 MG-10 MCG / 10 MCG tablet, Take 1 tablet by mouth daily. (Patient not taking:  Reported on 04/13/2018), Disp: 3 Package, Rfl: 3 .  metroNIDAZOLE (METROGEL) 0.75 % vaginal gel, Place 1 Applicatorful vaginally at bedtime. Apply one applicatorful to vagina at bedtime for 5 days, Disp: 70 g, Rfl: 1  Social History: Reviewed -  reports that she quit smoking about 5 years ago. Her smoking use included cigarettes. She has a 2.50 pack-year smoking history. She has never used smokeless tobacco.  Objective Findings:  Vitals: Blood pressure 116/66, pulse 90, height 5\' 6"  (1.676 m), weight 219 lb (99.3 kg), not currently breastfeeding.  PHYSICAL EXAMINATION General appearance - alert, well appearing, and in no distress, oriented to person, place, and time and overweight Mental status - alert, oriented to person, place, and time, normal mood, behavior, speech, dress, motor activity, and thought processes, affect appropriate to mood  PELVIC External genitalia - normal with some white moisture trapping between skin folds Vagina - normal amount of whitish discharge [place] - rare lactobacillus, many >50% clue cells, few white cells, neg trich neg yeast  Assessment & Plan:   A:  1.  Mild, chronic BV  P:  1.  Rx Metrogel 1x/month  2. Backup BCPs x2 wks   By signing my name below, I, Pietro Cassis, attest that this documentation has been prepared under the direction and in the presence of Tilda Burrow, MD. Electronically Signed: Pietro Cassis, Medical Scribe. 04/13/18. 9:08 AM.  Flonnie Hailstone of Attending Supervision of Advanced Practitioner: Evaluation and management procedures were performed by the PA/NP/CNM/OB Fellow, and I was available for supervision/collaboration. Chart reviewed and agree with management and plan.  Tilda Burrow 04/13/2018 9:08 AM   s

## 2018-06-19 ENCOUNTER — Encounter (HOSPITAL_COMMUNITY): Payer: Self-pay | Admitting: *Deleted

## 2018-06-19 ENCOUNTER — Emergency Department (HOSPITAL_COMMUNITY)
Admission: EM | Admit: 2018-06-19 | Discharge: 2018-06-19 | Disposition: A | Discharge status: Home / Self Care | Payer: Medicaid Other | Attending: Emergency Medicine | Admitting: Emergency Medicine

## 2018-06-19 ENCOUNTER — Other Ambulatory Visit: Payer: Self-pay

## 2018-06-19 DIAGNOSIS — M62830 Muscle spasm of back: Secondary | ICD-10-CM | POA: Diagnosis not present

## 2018-06-19 DIAGNOSIS — M545 Low back pain, unspecified: Secondary | ICD-10-CM

## 2018-06-19 DIAGNOSIS — F17210 Nicotine dependence, cigarettes, uncomplicated: Secondary | ICD-10-CM | POA: Diagnosis not present

## 2018-06-19 MED ORDER — TRAMADOL HCL 50 MG PO TABS: 50.0000 mg | ORAL_TABLET | Freq: Four times a day (QID) | ORAL | 0 refills | Status: DC | PRN

## 2018-06-19 MED ORDER — CYCLOBENZAPRINE HCL 10 MG PO TABS: 10.0000 mg | ORAL_TABLET | Freq: Three times a day (TID) | ORAL | 0 refills | Status: DC

## 2018-06-19 MED ORDER — DICLOFENAC SODIUM 75 MG PO TBEC: 75.0000 mg | DELAYED_RELEASE_TABLET | Freq: Two times a day (BID) | ORAL | 0 refills | Status: DC

## 2018-06-19 NOTE — ED Triage Notes (Signed)
Pt c/o lower back pain x several days, worsening today. Denies urinary symptoms. Denies injury.

## 2018-06-19 NOTE — ED Provider Notes (Signed)
[facility] EMERGENCY DEPARTMENT Provider Note   CSN: [id number] Arrival date & time: 06/19/18  1832    History   Chief Complaint Chief Complaint  Patient presents with  . Back Pain    HPI [name] is a 33 y.o. female.     The history is provided by the patient.  Back Pain  Location:  Lumbar spine Quality:  Aching Pain severity:  Moderate Pain is:  Same all the time Onset quality:  Gradual Duration:  4 days Timing:  Intermittent Progression:  Worsening Chronicity: acute on chronic. Context: not falling, not MCA and not recent illness   Context comment:  Hx of back injury in the past. Has back problem about 2 times each year. Relieved by:  Nothing Worsened by:  Movement Ineffective treatments:  Lying down and OTC medications Associated symptoms: no abdominal pain, no bladder incontinence, no bowel incontinence, no chest pain, no dysuria, no numbness, no perianal numbness and no weakness   Risk factors: no hx of cancer and no recent surgery     Past Medical History:  Diagnosis Date  . BV (bacterial vaginosis) 04/10/2015  . Depression   . HSV infection   . Irregular bleeding 01/17/2013  . Urinary frequency 04/10/2015  . Vaginal discharge 04/19/2013  . Vaginal odor 04/10/2015  . Vitamin D deficiency 04/11/2015  . Yeast infection 04/19/2013    Patient Active Problem List   Diagnosis Date Noted  . Pilonidal cyst   . History of gestational diabetes 02/15/2017  . Vitamin D deficiency 04/11/2015  . Bacterial vaginosis 04/10/2015  . S/P cholecystectomy 06/19/2013  . 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 Hypokalemia 09/02/2012    Past Surgical History:  Procedure Laterality Date  . CHOLECYSTECTOMY N/A 09/05/2012   Procedure: LAPAROSCOPIC CHOLECYSTECTOMY;  Surgeon: Emelia LoronMatthew Wakefield, MD;  [facility];  Service: General;  Laterality: N/A;  . ERCP N/A 09/04/2012   Procedure: ENDOSCOPIC RETROGRADE CHOLANGIOPANCREATOGRAPHY (ERCP);  Surgeon: Rachael Feeaniel P Jacobs, MD;   [facility];  Service: Endoscopy;  Laterality: N/A;  . PILONIDAL CYST EXCISION N/A 01/04/2018   Procedure: CYST EXCISION PILONIDAL SIMPLE;  Surgeon: Franky MachoJenkins, Mark, MD;  Location: [facility] ORS;  Service: General;  Laterality: N/A;  low back  . WISDOM TOOTH EXTRACTION       OB History    Gravida  5   Para  5   Term  5   Preterm      AB      Living  5     SAB      TAB      Ectopic      Multiple  0   Live Births  5            Home Medications    Prior to Admission medications   Medication Sig Start Date End Date Taking? Authorizing Provider  LO LOESTRIN FE 1 MG-10 MCG / 10 MCG tablet Take 1 tablet by mouth daily. Patient not taking: Reported on 04/13/2018 05/23/17   Cheral MarkerBooker, Kimberly R, CNM  metroNIDAZOLE (METROGEL) 0.75 % vaginal gel Place 1 Applicatorful vaginally at bedtime. Apply one applicatorful to vagina at bedtime for 5 days 04/13/18   Tilda BurrowFerguson, John V, MD    Family History Family History  Problem Relation Age of Onset  . CAD Maternal Grandmother   . Diabetes Maternal Grandmother   . Hypertension Mother   . Diabetes Mother   . Breast cancer Maternal Aunt   . Hypertension Maternal Grandfather   . Stroke Maternal  Grandfather     Social History Social History   Tobacco Use  . Smoking status: Current Every Day Smoker    Years: 10.00    Types: Cigarettes, Cigars    Last attempt to quit: 08/29/2012    Years since quitting: 5.8  . Smokeless tobacco: Never Used  . Tobacco comment: 1-2 cigars daily  Substance Use Topics  . Alcohol use: No  . Drug use: Not Currently    Comment: pt denies     Allergies   Patient has no known allergies.   Review of Systems Review of Systems  Constitutional: Negative for activity change.       All ROS Neg except as noted in HPI  HENT: Negative for nosebleeds.   Eyes: Negative for photophobia and discharge.  Respiratory: Negative for cough, shortness of breath and wheezing.   Cardiovascular: Negative for chest pain  and palpitations.  Gastrointestinal: Negative for abdominal pain, blood in stool and bowel incontinence.  Genitourinary: Negative for bladder incontinence, dysuria, frequency and hematuria.  Musculoskeletal: Positive for back pain. Negative for arthralgias and neck pain.  Skin: Negative.   Neurological: Negative for dizziness, seizures, speech difficulty, weakness and numbness.  Psychiatric/Behavioral: Negative for confusion and hallucinations.     Physical Exam Updated Vital Signs BP 132/75 (BP Location: Right Arm)   Pulse 81   Temp 98.7 F (37.1 C) (Oral)   Resp 16   Ht 5\' 4"  (1.626 m)   Wt 99.8 kg   LMP  (Within Months) Comment: end of Feb. 2020  SpO2 100%   BMI 37.76 kg/m   Physical Exam Vitals signs and nursing note reviewed.  Constitutional:      General: She is not in acute distress.    Appearance: She is well-developed.  HENT:     Head: Normocephalic and atraumatic.     Right Ear: External ear normal.     Left Ear: External ear normal.  Eyes:     General: No scleral icterus.       Right eye: No discharge.        Left eye: No discharge.     Conjunctiva/sclera: Conjunctivae normal.  Neck:     Musculoskeletal: Neck supple.     Trachea: No tracheal deviation.  Cardiovascular:     Rate and Rhythm: Normal rate and regular rhythm.  Pulmonary:     Effort: Pulmonary effort is normal. No respiratory distress.     Breath sounds: Normal breath sounds. No stridor. No wheezing or rales.  Abdominal:     General: Bowel sounds are normal. There is no distension.     Palpations: Abdomen is soft.     Tenderness: There is no abdominal tenderness. There is no guarding or rebound.  Musculoskeletal:        General: No tenderness.     Lumbar back: She exhibits pain and spasm.  Skin:    General: Skin is warm and dry.     Findings: No rash.  Neurological:     Mental Status: She is alert.     Cranial Nerves: No cranial nerve deficit (no facial droop, extraocular movements  intact, no slurred speech).     Sensory: No sensory deficit.     Motor: No abnormal muscle tone or seizure activity.     Coordination: Coordination normal.      ED Treatments / Results  Labs (all labs ordered are listed, but only abnormal results are displayed) Labs Reviewed - No data to display  EKG None  Radiology No results found.  Procedures Procedures (including critical care time)  Medications Ordered in ED Medications - No data to display   Initial Impression / Assessment and Plan / ED Course  I have reviewed the triage vital signs and the nursing notes.  Pertinent labs & imaging results that were available during my care of the patient were reviewed by me and considered in my medical decision making (see chart for details).         Final Clinical Impressions(s) / ED Diagnoses Vital signs reviewed No evidence for Cauda Equina or other emergent issues.   Pt has had this problem in the past. Suggested she discuss evaluation and possible MRI with her primary MD. Rx for muscle relaxant and NSAID given to the patient. Pt to return if any changes in symptoms, problems or concerns.   Final diagnoses:  Lumbar back pain  Muscle spasm of back    ED Discharge Orders         Ordered    cyclobenzaprine (FLEXERIL) 10 MG tablet  3 times daily     06/19/18 1931    diclofenac (VOLTAREN) 75 MG EC tablet  2 times daily     06/19/18 1931    traMADol (ULTRAM) 50 MG tablet  Every 6 hours PRN     06/19/18 1931           Ivery Quale, PA-C 06/20/18 1323    Long, Arlyss Repress, MD 06/20/18 1500

## 2018-06-19 NOTE — Discharge Instructions (Addendum)
Your vital signs are within normal limits.  Your oxygen level is 100% on room air.  Your examination suggest muscle spasm and muscle strain, particularly in your lower back.  No hot areas appreciated at this time.  Please use diclofenac 2 times daily with food.  Use Flexeril 3 times daily for spasm pain.  Use Ultram for more severe pain not controlled by the diclofenac or the Flexeril.  Flexeril and the Ultram medication may cause drowsiness, and/or lightheadedness.  Please do not drive a vehicle, operate machinery, drink alcohol, or participate in activities requiring concentration when taking either these medications.  Please notify your primary physician or Dr. Romeo Apple concerning your recurring lower back problem for additional evaluation, including possible MRI of your back.

## 2018-07-09 ENCOUNTER — Other Ambulatory Visit: Payer: Self-pay | Admitting: Women's Health

## 2018-09-04 ENCOUNTER — Telehealth: Payer: Self-pay | Admitting: *Deleted

## 2018-09-04 ENCOUNTER — Other Ambulatory Visit: Payer: Self-pay | Admitting: Women's Health

## 2018-09-04 ENCOUNTER — Telehealth: Payer: Self-pay | Admitting: Advanced Practice Midwife

## 2018-09-04 MED ORDER — FLUCONAZOLE 150 MG PO TABS: 150.0000 mg | ORAL_TABLET | Freq: Once | ORAL | 0 refills | Status: DC

## 2018-09-04 NOTE — Telephone Encounter (Signed)
Patient called stating that she would like for a provider to call her in a medication that she previously use to take. Please contact pt

## 2018-09-04 NOTE — Telephone Encounter (Signed)
Patient called requesting Diflucan for cottage cheese like discharge and itching.  She has tried the Monistat but does not feel like that ever works for her as opposed to the [place].  Please advise.

## 2018-10-16 ENCOUNTER — Telehealth: Payer: Self-pay | Admitting: Women's Health

## 2018-10-16 ENCOUNTER — Telehealth: Payer: Self-pay | Admitting: Obstetrics & Gynecology

## 2018-10-16 MED ORDER — FLUCONAZOLE 150 MG PO TABS: ORAL_TABLET | ORAL | 1 refills | Status: DC

## 2018-10-16 NOTE — Telephone Encounter (Signed)
Pt is requesting medication for a yeast infection to be sent in to her pharmacy.

## 2018-10-16 NOTE — Telephone Encounter (Signed)
done

## 2018-12-04 ENCOUNTER — Ambulatory Visit (INDEPENDENT_AMBULATORY_CARE_PROVIDER_SITE_OTHER): Payer: Medicaid Other | Admitting: Adult Health

## 2018-12-04 ENCOUNTER — Encounter: Payer: Self-pay | Admitting: Adult Health

## 2018-12-04 ENCOUNTER — Other Ambulatory Visit: Payer: Self-pay

## 2018-12-04 VITALS — BP 119/80 | HR 81 | Ht 64.0 in | Wt 204.5 lb

## 2018-12-04 DIAGNOSIS — B9689 Other specified bacterial agents as the cause of diseases classified elsewhere: Secondary | ICD-10-CM

## 2018-12-04 DIAGNOSIS — N760 Acute vaginitis: Secondary | ICD-10-CM

## 2018-12-04 DIAGNOSIS — N898 Other specified noninflammatory disorders of vagina: Secondary | ICD-10-CM | POA: Insufficient documentation

## 2018-12-04 DIAGNOSIS — N9089 Other specified noninflammatory disorders of vulva and perineum: Secondary | ICD-10-CM | POA: Insufficient documentation

## 2018-12-04 LAB — POCT WET PREP (WET MOUNT)
Clue Cells Wet Prep Whiff POC: POSITIVE
WBC, Wet Prep HPF POC: POSITIVE

## 2018-12-04 MED ORDER — FLUCONAZOLE 150 MG PO TABS: ORAL_TABLET | ORAL | 1 refills | Status: DC

## 2018-12-04 MED ORDER — METRONIDAZOLE 0.75 % VA GEL: 1.0000 | Freq: Two times a day (BID) | VAGINAL | 1 refills | Status: DC

## 2018-12-04 NOTE — Progress Notes (Signed)
  Subjective:     Patient ID: [name], female   DOB: 29-Apr-1985, 33 y.o.   MRN: [id number]  HPI [name] is a 33 year old black female, G5P5 in complaining of vaginal/vulva irritation and itching for about a week then discharge and odor the weekend. Denies soap changes or new partner.  Review of Systems +vaginal/vulva irritation +itching +vaginal discharge and odor    Objective:   Physical Exam BP 119/80 (BP Location: Left Arm, Patient Position: Sitting, Cuff Size: Large)   Pulse 81   Ht 5\' 4"  (1.626 m)   Wt 204 lb 8 oz (92.8 kg)   LMP 11/12/2018 (Approximate)   Breastfeeding No   BMI 35.10 kg/m  Skin warm and dry.Pelvic: external genitalia is normal in appearance no lesions, vagina: white discharge with odor,urethra has no lesions or masses noted, cervix:smooth and bulbous, uterus: normal size, shape and contour, non tender, no masses felt, adnexa: no masses or tenderness noted. Bladder is non tender and no masses felt. Wet prep: + for clue cells and +WBCs. Pt declined STD testing Examination chaperoned by Levy Pupa LPN Fall risk is low.    Assessment:     1. Bacterial vaginosis recurrent   2. Vaginal discharge   3. Vulvar irritation   4. Vaginal odor       Plan:     Meds ordered this encounter  Medications  . metroNIDAZOLE (METROGEL) 0.75 % vaginal gel    Sig: Place 1 Applicatorful vaginally 2 (two) times daily.    Dispense:  70 g    Refill:  1    Order Specific Question:   Supervising Provider    Answer:   EURE, LUTHER H [[id number]]  . fluconazole (DIFLUCAN) 150 MG tablet    Sig: Take 1 now and repeat 1 in 3 days    Dispense:  2 tablet    Refill:  1    Order Specific Question:   Supervising Provider    Answer:   Tania Ade H [[id number]]  Return in 4 weeks for pap and physical

## 2018-12-19 ENCOUNTER — Telehealth: Payer: Self-pay | Admitting: *Deleted

## 2018-12-19 NOTE — Telephone Encounter (Signed)
Pt reports that she took diflucan and metrogel and didn't seem better. So she got the refills and started the diflucan again and has a cottage cheese discharge. Wants to know if she should do the metrogel again too.

## 2018-12-20 NOTE — Telephone Encounter (Signed)
Called patient and left vm to not get the gel filled yet. If not better in a few days advised to call for appt with Jenn.

## 2018-12-25 ENCOUNTER — Other Ambulatory Visit: Payer: Self-pay

## 2018-12-25 ENCOUNTER — Ambulatory Visit (INDEPENDENT_AMBULATORY_CARE_PROVIDER_SITE_OTHER): Payer: Medicaid Other | Admitting: Adult Health

## 2018-12-25 ENCOUNTER — Encounter: Payer: Self-pay | Admitting: Adult Health

## 2018-12-25 VITALS — BP 130/78 | HR 80 | Ht 64.0 in | Wt 199.0 lb

## 2018-12-25 DIAGNOSIS — N9089 Other specified noninflammatory disorders of vulva and perineum: Secondary | ICD-10-CM | POA: Diagnosis not present

## 2018-12-25 DIAGNOSIS — L292 Pruritus vulvae: Secondary | ICD-10-CM | POA: Insufficient documentation

## 2018-12-25 MED ORDER — NYSTATIN 100000 UNIT/GM EX OINT: 1.0000 "application " | TOPICAL_OINTMENT | Freq: Two times a day (BID) | CUTANEOUS | 1 refills | Status: DC

## 2018-12-25 MED ORDER — TRIAMCINOLONE ACETONIDE 0.5 % EX OINT: 1.0000 "application " | TOPICAL_OINTMENT | Freq: Two times a day (BID) | CUTANEOUS | 1 refills | Status: DC

## 2018-12-25 NOTE — Progress Notes (Signed)
  Subjective:     Patient ID: [name], female   DOB: 05/20/1985, 33 y.o.   MRN: [id number]  HPI [name] is a 33 year old black female, G5P5 in complaining of vulva irritation and itching, denies any discharge or odor. PCP is Eston Esters NP.  Review of Systems +vulva irritation and itching No discharge of odor, has used Metrogel and diflucan  Reviewed past medical,surgical, social and family history. Reviewed medications and allergies.     Objective:   Physical Exam BP 130/78 (BP Location: Left Arm, Patient Position: Sitting, Cuff Size: Normal)   Pulse 80   Ht 5\' 4"  (1.626 m)   Wt 199 lb (90.3 kg)   LMP 12/11/2018   Breastfeeding No   BMI 34.16 kg/m   Fall risk is low. Skin warm and dry.Pelvic: external genitalia is normal in appearance no lesions, vagina: no discharge or odor,urethra has no lesions or masses noted, cervix:smooth and bulbous, uterus: normal size, shape and contour, non tender, no masses felt, adnexa: no masses or tenderness noted. Bladder is non tender and no masses felt. Examination chaperoned by Rolena Infante LPN    Assessment:     1. Vulvar irritation   2. Vulvar itching       Plan:     Meds ordered this encounter  Medications  . nystatin ointment (MYCOSTATIN)    Sig: Apply 1 application topically 2 (two) times daily.    Dispense:  30 g    Refill:  1    Order Specific Question:   Supervising Provider    Answer:   EURE, LUTHER H [[id number]]  . triamcinolone ointment (KENALOG) 0.5 %    Sig: Apply 1 application topically 2 (two) times daily.    Dispense:  30 g    Refill:  1    Order Specific Question:   Supervising Provider    Answer:   Florian Buff [[id number]]  Use new razor if shaves  Has appt 11/9 for pap and physical

## 2019-01-08 ENCOUNTER — Encounter: Payer: Self-pay | Admitting: Adult Health

## 2019-01-08 ENCOUNTER — Other Ambulatory Visit: Payer: Self-pay

## 2019-01-08 ENCOUNTER — Ambulatory Visit: Payer: Medicaid Other | Admitting: Adult Health

## 2019-01-08 ENCOUNTER — Other Ambulatory Visit (HOSPITAL_COMMUNITY)
Admission: RE | Admit: 2019-01-08 | Discharge: 2019-01-08 | Disposition: A | Discharge status: Home / Self Care | Payer: Medicaid Other | Source: Ambulatory Visit | Attending: Adult Health | Admitting: Adult Health

## 2019-01-08 VITALS — BP 121/67 | HR 75 | Ht 63.0 in | Wt 201.5 lb

## 2019-01-08 DIAGNOSIS — Z0000 Encounter for general adult medical examination without abnormal findings: Secondary | ICD-10-CM | POA: Insufficient documentation

## 2019-01-08 DIAGNOSIS — Z01419 Encounter for gynecological examination (general) (routine) without abnormal findings: Secondary | ICD-10-CM | POA: Insufficient documentation

## 2019-01-08 NOTE — Progress Notes (Signed)
Patient ID: [name], female   DOB: 12-Jan-1986, 33 y.o.   MRN: [id number] History of Present Illness: [name] is a 33 year old black female, single, G5P5, in for a well woman gyn exam and pap. No PCP she says.   Current Medications, Allergies, Past Medical History, Past Surgical History, Family History and Social History were reviewed in [employer] record.     Review of Systems:  Patient denies any headaches, hearing loss, fatigue, blurred vision, shortness of breath, chest pain, abdominal pain, problems with bowel movements, urination, or intercourse. No joint pain or mood swings. The vulva irritation she had in October resolved with treatment given then. Using condoms for now.   Physical Exam:BP 121/67 (BP Location: Left Arm, Patient Position: Sitting, Cuff Size: Large)   Pulse 75   Ht 5\' 3"  (1.6 m)   Wt 201 lb 8 oz (91.4 kg)   LMP 01/05/2019   Breastfeeding No   BMI 35.69 kg/m  General:  Well developed, well nourished, no acute distress Skin:  Warm and dry Neck:  Midline trachea, normal thyroid, good ROM, no lymphadenopathy Lungs; Clear to auscultation bilaterally Breast:  No dominant palpable mass, retraction, or nipple discharge Cardiovascular: Regular rate and rhythm Abdomen:  Soft, non tender, no hepatosplenomegaly Pelvic:  External genitalia is normal in appearance, no lesions.  The vagina is normal in appearance. Urethra has no lesions or masses. The cervix is bulbous. Pap with GC/CHL, and high risk HPV 16/18 genotyping performed. Uterus is felt to be normal size, shape, and contour.  No adnexal masses or tenderness noted.Bladder is non tender, no masses felt. Extremities/musculoskeletal:  No swelling or varicosities noted, no clubbing or cyanosis Psych:  No mood changes, alert and cooperative,seems happy Fall risk is low PHQ 2 score is 0 Examination chaperoned by Levy Pupa LPN  Impression and Plan:  1. Routine medical exam  2.  Encounter for gynecological examination with Papanicolaou smear of cervix Pap sent  Physical in 1 year Pap in 3 if normal Mammogram at 39 Fasting labs in future use condoms

## 2019-01-11 LAB — CYTOLOGY - PAP
Chlamydia: NEGATIVE
Comment: NEGATIVE
Comment: NEGATIVE
Comment: NORMAL
Diagnosis: NEGATIVE
High risk HPV: NEGATIVE
Neisseria Gonorrhea: NEGATIVE

## 2019-02-21 ENCOUNTER — Telehealth: Payer: Self-pay | Admitting: *Deleted

## 2019-02-21 NOTE — Telephone Encounter (Signed)
Patient called requesting refill for Diflucan.   Returned patient's call and she stated she did not realize she had a refill yet and has already gotten it filled.

## 2019-02-27 ENCOUNTER — Telehealth: Payer: Self-pay | Admitting: Adult Health

## 2019-02-27 NOTE — Telephone Encounter (Signed)
Called patient regarding appointment scheduled in our office and advised to come alone to the visits, however, a support person, over age 34, may accompany her  to appointment if assistance is needed for safety or care concerns. Otherwise, support persons should remain outside until the visit is complete.   We ask if you have had any exposure to anyone suspected or confirmed of having COVID-19, are awaiting test results for COVID-19 or if you are experiencing any of the following, to call and reschedule your appointment: fever, cough, shortness of breath, muscle pain, diarrhea, rash, vomiting, abdominal pain, red eye, weakness, bruising, bleeding, joint pain, or a severe headache.   Please know we will ask you these questions or similar questions when you arrive for your appointment and again it's how we are keeping everyone safe.    Also,to keep you safe, please use the provided hand sanitizer when you enter the office. We are asking everyone in the office to wear a mask to help prevent the spread of germs. If you have a mask of your own, please wear it to your appointment, if not, we are happy to provide one for you.  Thank you for understanding and your cooperation.    [facility] Staff    

## 2019-02-28 ENCOUNTER — Encounter: Payer: Self-pay | Admitting: Adult Health

## 2019-02-28 ENCOUNTER — Other Ambulatory Visit: Payer: Self-pay

## 2019-02-28 ENCOUNTER — Ambulatory Visit (INDEPENDENT_AMBULATORY_CARE_PROVIDER_SITE_OTHER): Payer: Medicaid Other | Admitting: Adult Health

## 2019-02-28 ENCOUNTER — Other Ambulatory Visit (HOSPITAL_COMMUNITY)
Admission: RE | Admit: 2019-02-28 | Discharge: 2019-02-28 | Disposition: A | Discharge status: Home / Self Care | Payer: Medicaid Other | Source: Ambulatory Visit | Attending: Adult Health | Admitting: Adult Health

## 2019-02-28 VITALS — BP 121/81 | HR 90 | Ht 64.0 in | Wt 195.0 lb

## 2019-02-28 DIAGNOSIS — N898 Other specified noninflammatory disorders of vagina: Secondary | ICD-10-CM | POA: Insufficient documentation

## 2019-02-28 LAB — POCT WET PREP (WET MOUNT)
Clue Cells Wet Prep Whiff POC: NEGATIVE
Trichomonas Wet Prep HPF POC: ABSENT
WBC, Wet Prep HPF POC: POSITIVE

## 2019-02-28 NOTE — Progress Notes (Signed)
  Subjective:     Patient ID: [name], female   DOB: 12-25-1985, 33 y.o.   MRN: [id number]  HPI [name] is a 33 year old black female, G5P5 in complaining of vaginal irritation and white clumpy discharge. PCP is Eston Esters NP.  Review of Systems Had used diflucan and felt better and then had irritation and used metrogel and now feels irritated again and has white clumpy discharge Denies any new products or partners  Reviewed past medical,surgical, social and family history. Reviewed medications and allergies.     Objective:   Physical Exam  BP 121/81 (BP Location: Left Arm, Patient Position: Sitting, Cuff Size: Normal)   Pulse 90   Ht 5\' 4"  (1.626 m)   Wt 195 lb (88.5 kg)   LMP 02/16/2019 (Exact Date)   BMI 33.47 kg/m   Skin warm and dry.Pelvic: external genitalia is normal in appearance no lesions, vagina: white discharge(looks like Metrogel) without odor,urethra has no lesions or masses noted, cervix:smooth and bulbous, uterus: normal size, shape and contour, non tender, no masses felt, adnexa: no masses or tenderness noted. Bladder is non tender and no masses felt.+WBCs. CV swab obtained, declines STD testing Examination chaperoned by Rolena Infante LPN    Assessment:     1. Vaginal discharge CV swab sent for BV and yeast  2. Vaginal irritation Just leave vaginal alone for now, no more metrogel    Plan:    Will talk when CV results back  Follow up prn

## 2019-03-01 LAB — CERVICOVAGINAL ANCILLARY ONLY
Bacterial Vaginitis (gardnerella): NEGATIVE
Candida Glabrata: NEGATIVE
Candida Vaginitis: NEGATIVE
Comment: NEGATIVE
Comment: NEGATIVE
Comment: NEGATIVE

## 2019-03-05 ENCOUNTER — Telehealth: Payer: Self-pay | Admitting: *Deleted

## 2019-03-05 NOTE — Telephone Encounter (Signed)
Pt aware swab was negative for BV and yeast. Pt voiced understanding. JSY

## 2019-03-05 NOTE — Telephone Encounter (Signed)
-----   Message from Adline Potter, NP sent at 03/05/2019 11:37 AM EST ----- Let pt know CV swab was negative for BV and yeast

## 2019-03-22 ENCOUNTER — Telehealth: Payer: Self-pay | Admitting: *Deleted

## 2019-03-22 ENCOUNTER — Telehealth: Payer: Self-pay | Admitting: Adult Health

## 2019-03-22 NOTE — Telephone Encounter (Signed)
Patient left message that she is scheduled for a visit tomorrow in our office but has some questions/concerns she would like to discuss prior to the appt. Please call.

## 2019-03-22 NOTE — Telephone Encounter (Signed)
Left message @ 12:26 pm. JSY 

## 2019-03-22 NOTE — Telephone Encounter (Signed)
Called patient regarding appointment scheduled in our office and advised to come alone to the visit, however, a support person, over age 35, may accompany her to appointment if assistance is needed for safety or care concerns. Otherwise, support persons should remain outside until the visit is complete.   Prescreen questions asked: 1. Any of the following symptoms of COVID such as chills, fever, cough, shortness of breath, muscle pain, diarrhea, rash, vomiting, abdominal pain, red eye, weakness, bruising, bleeding, joint pain, loss of taste or smell, a severe headache, sore throat, fatigue 2. Any exposure to anyone suspected or confirmed of having COVID-19 3. Awaiting test results for COVID-19  Also,to keep you safe, please use the provided hand sanitizer when you enter the office. We are asking everyone in the office to wear a mask to help prevent the spread of germs. If you have a mask of your own, please wear it to your appointment, if not, we are happy to provide one for you.  Thank you for understanding and your cooperation.    [facility] Staff      

## 2019-03-23 ENCOUNTER — Ambulatory Visit (INDEPENDENT_AMBULATORY_CARE_PROVIDER_SITE_OTHER): Payer: Medicaid Other | Admitting: Adult Health

## 2019-03-23 ENCOUNTER — Other Ambulatory Visit (HOSPITAL_COMMUNITY)
Admission: RE | Admit: 2019-03-23 | Discharge: 2019-03-23 | Disposition: A | Discharge status: Home / Self Care | Payer: Medicaid Other | Source: Ambulatory Visit | Attending: Adult Health | Admitting: Adult Health

## 2019-03-23 ENCOUNTER — Other Ambulatory Visit: Payer: Self-pay

## 2019-03-23 ENCOUNTER — Encounter: Payer: Self-pay | Admitting: Adult Health

## 2019-03-23 VITALS — BP 124/76 | HR 75 | Ht 63.0 in | Wt 194.5 lb

## 2019-03-23 DIAGNOSIS — Z3202 Encounter for pregnancy test, result negative: Secondary | ICD-10-CM

## 2019-03-23 DIAGNOSIS — N898 Other specified noninflammatory disorders of vagina: Secondary | ICD-10-CM

## 2019-03-23 DIAGNOSIS — Z113 Encounter for screening for infections with a predominantly sexual mode of transmission: Secondary | ICD-10-CM

## 2019-03-23 DIAGNOSIS — Z30013 Encounter for initial prescription of injectable contraceptive: Secondary | ICD-10-CM

## 2019-03-23 LAB — POCT URINALYSIS DIPSTICK
Blood, UA: NEGATIVE
Glucose, UA: NEGATIVE
Leukocytes, UA: NEGATIVE
Nitrite, UA: NEGATIVE
Protein, UA: POSITIVE — AB

## 2019-03-23 LAB — POCT URINE PREGNANCY: Preg Test, Ur: NEGATIVE

## 2019-03-23 MED ORDER — MEDROXYPROGESTERONE ACETATE 150 MG/ML IM SUSP: 150.0000 mg | INTRAMUSCULAR | 4 refills | Status: DC

## 2019-03-23 NOTE — Telephone Encounter (Signed)
Pt was seen in office on 03/23/19. Encounter closed. JSY

## 2019-03-23 NOTE — Progress Notes (Addendum)
  Subjective:     Patient ID: [name], female   DOB: 1985-05-01, 34 y.o.   MRN: [id number]  HPI [name] is a 34 year old black female, single, G5P5005, in complaining of vaginal discharge and irritation,had fishy odor and  used monistat and Metrogel last week.Her boyfriend and was shot and killed recently. She is request to get on depo provera injection, has been using condoms.  PCP is Cline Crock, NP.    Review of Systems +vaginal discharge +fishy odor at times, used Metrogel x 2 and took 1 dose monistat    +vaginal irritation   Reviewed past medical,surgical, social and family history. Reviewed medications and allergies.  Objective:   Physical Exam BP 124/76 (BP Location: Left Arm, Patient Position: Sitting, Cuff Size: Normal)   Pulse 75   Ht 5\' 3"  (1.6 m)   Wt 194 lb 8 oz (88.2 kg)   LMP 03/09/2019   Breastfeeding No   BMI 34.45 kg/m UPT is negative. Urine dipstick is negative Fall risk is low Skin warm and dry.Pelvic: external genitalia is normal in appearance no lesions, vagina: white discharge without odor,urethra has no lesions or masses noted, cervix:smooth and bulbous, uterus: normal size, shape and contour, non tender, no masses felt, adnexa: no masses or tenderness noted. Bladder is non tender and no masses felt. CV swab obtained.   Examination chaperoned by 05/07/2019 CMA.  Assessment:     1. Pregnancy examination or test, negative result  2. Vaginal discharge CV swab sent  3. Vaginal irritation CV swab sent  4. Screen for STD (sexually transmitted disease) CV swab sent for GC/CHL,trich,BV and yeast  5. Encounter for initial prescription of injectable contraceptive Will rx depo, call with period for first injection  Meds ordered this encounter  Medications  . medroxyPROGESTERone (DEPO-PROVERA) 150 MG/ML injection    Sig: Inject 1 mL (150 mg total) into the muscle every 3 (three) months.    Dispense:  1 mL    Refill:  4    Order  Specific Question:   Supervising Provider    Answer:   Federico Flake [[id number]]      Plan:     Call with period for first depo injection

## 2019-03-26 ENCOUNTER — Telehealth: Payer: Self-pay | Admitting: *Deleted

## 2019-03-26 NOTE — Telephone Encounter (Signed)
Patient called for test results.  States she noticed a vaginal odor on Saturday along with an increase in vaginal irritation. Patient would like a call back.

## 2019-03-27 ENCOUNTER — Telehealth: Payer: Self-pay | Admitting: Adult Health

## 2019-03-27 LAB — CERVICOVAGINAL ANCILLARY ONLY
Bacterial Vaginitis (gardnerella): POSITIVE — AB
Candida Glabrata: NEGATIVE
Candida Vaginitis: NEGATIVE
Chlamydia: NEGATIVE
Comment: NEGATIVE
Comment: NEGATIVE
Comment: NEGATIVE
Comment: NEGATIVE
Comment: NEGATIVE
Comment: NORMAL
Neisseria Gonorrhea: NEGATIVE
Trichomonas: NEGATIVE

## 2019-03-27 MED ORDER — METRONIDAZOLE 500 MG PO TABS: 500.0000 mg | ORAL_TABLET | Freq: Two times a day (BID) | ORAL | 1 refills | Status: DC

## 2019-03-27 NOTE — Telephone Encounter (Signed)
Pt aware that CV swab +BV, other tests negative, will rx flagyl

## 2019-04-11 ENCOUNTER — Other Ambulatory Visit: Payer: Self-pay

## 2019-04-11 ENCOUNTER — Ambulatory Visit (INDEPENDENT_AMBULATORY_CARE_PROVIDER_SITE_OTHER): Payer: Medicaid Other | Admitting: *Deleted

## 2019-04-11 ENCOUNTER — Encounter: Payer: Self-pay | Admitting: *Deleted

## 2019-04-11 DIAGNOSIS — Z3042 Encounter for surveillance of injectable contraceptive: Secondary | ICD-10-CM | POA: Diagnosis not present

## 2019-04-11 DIAGNOSIS — Z3202 Encounter for pregnancy test, result negative: Secondary | ICD-10-CM

## 2019-04-11 LAB — POCT URINE PREGNANCY: Preg Test, Ur: NEGATIVE

## 2019-04-11 MED ORDER — MEDROXYPROGESTERONE ACETATE 150 MG/ML IM SUSP
150.0000 mg | Freq: Once | INTRAMUSCULAR | Status: AC
  Administered 2019-04-11: 10:00:00 150 mg via INTRAMUSCULAR

## 2019-04-11 NOTE — Progress Notes (Signed)
   NURSE VISIT- INJECTION  SUBJECTIVE:  [name] is a 34 y.o. [id number] female here for a Depo Provera for contraception/period management. She is a GYN patient.   OBJECTIVE:  There were no vitals taken for this visit.  Appears well, in no apparent distress  Injection administered in: Right upper quad. gluteus  Meds ordered this encounter  Medications  . medroxyPROGESTERone (DEPO-PROVERA) injection 150 mg    ASSESSMENT: GYN patient Depo Provera for contraception/period management PLAN: Follow-up: in 11-13 weeks for next Depo   Jobe Marker  04/11/2019 10:21 AM

## 2019-05-02 DIAGNOSIS — R350 Frequency of micturition: Secondary | ICD-10-CM | POA: Diagnosis not present

## 2019-05-02 DIAGNOSIS — A599 Trichomoniasis, unspecified: Secondary | ICD-10-CM | POA: Diagnosis not present

## 2019-05-02 DIAGNOSIS — N898 Other specified noninflammatory disorders of vagina: Secondary | ICD-10-CM | POA: Diagnosis not present

## 2019-05-22 ENCOUNTER — Other Ambulatory Visit (HOSPITAL_COMMUNITY)
Admission: RE | Admit: 2019-05-22 | Discharge: 2019-05-22 | Disposition: A | Discharge status: Home / Self Care | Payer: Medicaid Other | Source: Ambulatory Visit | Attending: Obstetrics & Gynecology | Admitting: Obstetrics & Gynecology

## 2019-05-22 ENCOUNTER — Other Ambulatory Visit: Payer: Self-pay

## 2019-05-22 ENCOUNTER — Other Ambulatory Visit: Payer: Medicaid Other | Admitting: *Deleted

## 2019-05-22 DIAGNOSIS — Z113 Encounter for screening for infections with a predominantly sexual mode of transmission: Secondary | ICD-10-CM | POA: Insufficient documentation

## 2019-05-22 NOTE — Progress Notes (Signed)
   NURSE VISIT- POC  SUBJECTIVE:  [name] is a 34 y.o. [id number] GYN patientfemale here for a vaginal swab for STD screen.  She reports the following symptoms: none. Denies abnormal vaginal bleeding, significant pelvic pain, fever, or UTI symptoms.  OBJECTIVE:  There were no vitals taken for this visit.  Appears well, in no apparent distress  ASSESSMENT: Vaginal swab for proof of cure after treatment for trich.  PLAN: Self-collected vaginal probe for Gonorrhea, Chlamydia, Trichomonas sent to lab Treatment: to be determined once results are received Follow-up as needed if symptoms persist/worsen, or new symptoms develop  Jobe Marker  05/22/2019 10:17 AM

## 2019-05-23 LAB — RPR: RPR Ser Ql: NONREACTIVE

## 2019-05-23 LAB — CERVICOVAGINAL ANCILLARY ONLY
Chlamydia: NEGATIVE
Comment: NEGATIVE
Comment: NEGATIVE
Comment: NORMAL
Neisseria Gonorrhea: NEGATIVE
Trichomonas: NEGATIVE

## 2019-05-23 LAB — HIV ANTIBODY (ROUTINE TESTING W REFLEX): HIV Screen 4th Generation wRfx: NONREACTIVE

## 2019-07-10 ENCOUNTER — Ambulatory Visit: Payer: Medicaid Other

## 2019-07-11 ENCOUNTER — Ambulatory Visit (INDEPENDENT_AMBULATORY_CARE_PROVIDER_SITE_OTHER): Payer: Medicaid Other

## 2019-07-11 VITALS — Ht 63.0 in | Wt 199.4 lb

## 2019-07-11 DIAGNOSIS — Z3042 Encounter for surveillance of injectable contraceptive: Secondary | ICD-10-CM

## 2019-07-11 MED ORDER — MEDROXYPROGESTERONE ACETATE 150 MG/ML IM SUSP
150.0000 mg | Freq: Once | INTRAMUSCULAR | Status: AC
  Administered 2019-07-11: 150 mg via INTRAMUSCULAR

## 2019-07-11 NOTE — Progress Notes (Signed)
   NURSE VISIT- INJECTION  SUBJECTIVE:  [name] is a 34 y.o. [id number] female here for a depo injection  . She is a GYN patient.   OBJECTIVE:  Ht 5\' 3"  (1.6 m)   Wt 199 lb 6.4 oz (90.4 kg)   BMI 35.32 kg/m   Appears well, in no apparent distress  Injection administered in: Left upper quad. gluteus  Meds ordered this encounter  Medications  . medroxyPROGESTERone (DEPO-PROVERA) injection 150 mg    ASSESSMENT: GYN patient Depo Provera for contraception/period management PLAN: Follow-up: in 11-13 weeks for next Depo   04-18-1971  07/11/2019 2:35 PM

## 2019-08-28 ENCOUNTER — Other Ambulatory Visit: Payer: Self-pay

## 2019-08-28 ENCOUNTER — Ambulatory Visit: Payer: Medicaid Other | Admitting: Adult Health

## 2019-08-28 ENCOUNTER — Encounter: Payer: Self-pay | Admitting: Adult Health

## 2019-08-28 VITALS — BP 124/72 | HR 83 | Ht 64.0 in | Wt 204.5 lb

## 2019-08-28 DIAGNOSIS — R350 Frequency of micturition: Secondary | ICD-10-CM | POA: Diagnosis not present

## 2019-08-28 DIAGNOSIS — N898 Other specified noninflammatory disorders of vagina: Secondary | ICD-10-CM | POA: Diagnosis not present

## 2019-08-28 LAB — POCT URINALYSIS DIPSTICK
Blood, UA: NEGATIVE
Glucose, UA: NEGATIVE
Ketones, UA: NEGATIVE
Leukocytes, UA: NEGATIVE
Nitrite, UA: NEGATIVE
Protein, UA: NEGATIVE

## 2019-08-28 NOTE — Progress Notes (Signed)
  Subjective:     Patient ID: [name], female   DOB: [birth year], 34 y.o.   MRN: [id number]  HPI [name] is a 34 year old black female, single, G5P5 on depo, in complaining of urinary frequency and vaginal irritation.No discharge, itching or burning, noticed odor 2 days ago. PCP is Cline Crock NP  Review of Systems +urinary frequency +vaginal irritation +odor 2 days ago Denies vaginal discharge, itching or burning Reviewed past medical,surgical, social and family history. Reviewed medications and allergies.     Objective:   Physical Exam BP 124/72 (BP Location: Left Arm, Patient Position: Sitting, Cuff Size: Large)   Pulse 83   Ht 5\' 4"  (1.626 m)   Wt 204 lb 8 oz (92.8 kg)   LMP 08/21/2019   BMI 35.10 kg/m urine dipstick is negative. Fall risk is low. Skin warm and dry.Pelvic: external genitalia is normal in appearance no lesions, vagina: scant white discharge without odor,urethra has no lesions or masses noted, cervix:smooth and bulbous, uterus: normal size, shape and contour, non tender, no masses felt, adnexa: no masses or tenderness noted. Bladder is non tender and no masses felt. Nuswab obtained.   Examination chaperoned by 08/23/2019 LPN  Assessment:     1. Urinary frequency UA C&S sent  2. Vaginal irritation Nuswab sent    Plan:    Will talk when results back  Follow up prn

## 2019-08-30 LAB — NUSWAB VAGINITIS PLUS (VG+)
Candida albicans, NAA: NEGATIVE
Candida glabrata, NAA: NEGATIVE
Chlamydia trachomatis, NAA: NEGATIVE
Neisseria gonorrhoeae, NAA: NEGATIVE
Trich vag by NAA: NEGATIVE

## 2019-08-30 LAB — URINALYSIS, ROUTINE W REFLEX MICROSCOPIC
Bilirubin, UA: NEGATIVE
Glucose, UA: NEGATIVE
Ketones, UA: NEGATIVE
Leukocytes,UA: NEGATIVE
Nitrite, UA: NEGATIVE
Protein,UA: NEGATIVE
RBC, UA: NEGATIVE
Specific Gravity, UA: 1.023 (ref 1.005–1.030)
Urobilinogen, Ur: 1 mg/dL (ref 0.2–1.0)
pH, UA: 8 — ABNORMAL HIGH (ref 5.0–7.5)

## 2019-08-30 LAB — SPECIMEN STATUS REPORT

## 2019-08-30 LAB — URINE CULTURE

## 2019-09-17 ENCOUNTER — Other Ambulatory Visit: Payer: Self-pay

## 2019-09-17 ENCOUNTER — Encounter: Payer: Self-pay | Admitting: Emergency Medicine

## 2019-09-17 ENCOUNTER — Ambulatory Visit: Payer: Self-pay

## 2019-09-17 ENCOUNTER — Telehealth: Payer: Self-pay | Admitting: Emergency Medicine

## 2019-09-17 ENCOUNTER — Ambulatory Visit
Admission: EM | Admit: 2019-09-17 | Discharge: 2019-09-17 | Disposition: A | Discharge status: Home / Self Care | Payer: Medicaid Other | Attending: Emergency Medicine | Admitting: Emergency Medicine

## 2019-09-17 DIAGNOSIS — N898 Other specified noninflammatory disorders of vagina: Secondary | ICD-10-CM | POA: Diagnosis not present

## 2019-09-17 MED ORDER — METRONIDAZOLE 500 MG PO TABS: 500.0000 mg | ORAL_TABLET | Freq: Two times a day (BID) | ORAL | 0 refills | Status: DC

## 2019-09-17 NOTE — ED Provider Notes (Signed)
[facility]   [id number] 09/17/19 Arrival Time: 1542   [id number] odor  SUBJECTIVE:  [name] is a 34 y.o. female who presents with complaints of vaginal odor x 1 week.  She denies a precipitating event, recent sexual encounter or recent antibiotic use.  Denies alleviating factors.  Denies aggravating factors.  She reports similar symptoms in the past with BV.  She denies fever, chills, nausea, vomiting, abdominal or pelvic pain, urinary symptoms, vaginal itching, vaginal discharge, vaginal bleeding, dyspareunia, vaginal rashes or lesions.   Patient's last menstrual period was 08/21/2019.  ROS: As per HPI.  All other pertinent ROS negative.     Past Medical History:  Diagnosis Date  . BV (bacterial vaginosis) 04/10/2015  . Depression   . HSV infection   . Irregular bleeding 01/17/2013  . Urinary frequency 04/10/2015  . Vaginal discharge 04/19/2013  . Vaginal odor 04/10/2015  . Vitamin D deficiency 04/11/2015  . Yeast infection 04/19/2013   Past Surgical History:  Procedure Laterality Date  . CHOLECYSTECTOMY N/A 09/05/2012   Procedure: LAPAROSCOPIC CHOLECYSTECTOMY;  Surgeon: Emelia Loron, MD;  Location: [facility] OR;  Service: General;  Laterality: N/A;  . ERCP N/A 09/04/2012   Procedure: ENDOSCOPIC RETROGRADE CHOLANGIOPANCREATOGRAPHY (ERCP);  Surgeon: Rachael Fee, MD;  Location: [facility] OR;  Service: Endoscopy;  Laterality: N/A;  . PILONIDAL CYST EXCISION N/A 01/04/2018   Procedure: CYST EXCISION PILONIDAL SIMPLE;  Surgeon: Franky Macho, MD;  Location: [facility] ORS;  Service: General;  Laterality: N/A;  low back  . WISDOM TOOTH EXTRACTION     No Known Allergies No current facility-administered medications on file prior to encounter.   Current Outpatient Medications on File Prior to Encounter  Medication Sig Dispense Refill  . medroxyPROGESTERone (DEPO-PROVERA) 150 MG/ML injection Inject 1 mL (150 mg total) into the muscle every 3 (three) months. 1 mL 4    Social History    Socioeconomic History  . Marital status: Single    Spouse name: Not on file  . Number of children: Not on file  . Years of education: Not on file  . Highest education level: Not on file  Occupational History  . Not on file  Tobacco Use  . Smoking status: Former Smoker    Years: 10.00    Types: Cigarettes, Cigars    Quit date: 08/29/2012    Years since quitting: 7.0  . Smokeless tobacco: Never Used  . Tobacco comment: 1-2 cigars daily  Vaping Use  . Vaping Use: Never used  Substance and Sexual Activity  . Alcohol use: No  . Drug use: Not Currently    Comment: pt denies  . Sexual activity: Yes    Birth control/protection: Condom, Injection  Other Topics Concern  . Not on file  Social History Narrative  . Not on file   Social Determinants of Health   Financial Resource Strain:   . Difficulty of Paying Living Expenses:   Food Insecurity:   . Worried About Programme researcher, broadcasting/film/video in the Last Year:   . Barista in the Last Year:   Transportation Needs:   . Freight forwarder (Medical):   Marland Kitchen Lack of Transportation (Non-Medical):   Physical Activity:   . Days of Exercise per Week:   . Minutes of Exercise per Session:   Stress:   . Feeling of Stress :   Social Connections:   . Frequency of Communication with Friends and Family:   . Frequency of Social Gatherings with Friends and Family:   .  Attends Religious Services:   . Active Member of Clubs or Organizations:   . Attends Banker Meetings:   Marland Kitchen Marital Status:   Intimate Partner Violence:   . Fear of Current or Ex-Partner:   . Emotionally Abused:   Marland Kitchen Physically Abused:   . Sexually Abused:    Family History  Problem Relation Age of Onset  . CAD Maternal Grandmother   . Diabetes Maternal Grandmother   . Hypertension Mother   . Diabetes Mother   . Breast cancer Maternal Aunt   . Hypertension Maternal Grandfather   . Stroke Maternal Grandfather     OBJECTIVE:  Vitals:   09/17/19 1602   BP: 136/81  Pulse: 70  Resp: 18  Temp: 99.6 F (37.6 C)  TempSrc: Oral  SpO2: 97%  Weight: 205 lb (93 kg)     General appearance: Alert, NAD, appears stated age Head: NCAT Throat: lips, mucosa, and tongue normal; teeth and gums normal Lungs: CTA bilaterally without adventitious breath sounds Heart: regular rate and rhythm.  Radial pulses 2+ symmetrical bilaterally Back: no CVA tenderness Abdomen: soft, non-tender; bowel sounds normal; no guarding GU: deferred Skin: warm and dry Psychological:  Alert and cooperative. Normal mood and affect.   ASSESSMENT & PLAN:  1. Vaginal odor     Meds ordered this encounter  Medications  . metroNIDAZOLE (FLAGYL) 500 MG tablet    Sig: Take 1 tablet (500 mg total) by mouth 2 (two) times daily.    Dispense:  14 tablet    Refill:  0    Order Specific Question:   Supervising Provider    Answer:   Eustace Moore [[id number]]    Pending: Labs Reviewed  CERVICOVAGINAL ANCILLARY ONLY    Vaginal self-swab obtained.  We will follow up with you regarding abnormal results Declines HIV/ syphilis testing today Prescribed metronidazole 500 mg twice daily for 7 days (do not take while consuming alcohol and/or if breastfeeding) If tests results are positive, please abstain from sexual activity until you and your partner(s) have been treated Follow up with PCP as needed Return here or go to ER if you have any new or worsening symptoms fever, chills, nausea, vomiting, abdominal or pelvic pain, painful intercourse, vaginal discharge, vaginal bleeding, persistent symptoms despite treatment, etc...  Reviewed expectations re: course of current medical issues. Questions answered. Outlined signs and symptoms indicating need for more acute intervention. Patient verbalized understanding. After Visit Summary given.       Rennis Harding, PA-C 09/17/19 1616

## 2019-09-17 NOTE — ED Triage Notes (Signed)
Vaginal odor for little over 1 week.

## 2019-09-17 NOTE — Telephone Encounter (Signed)
Sent to pharmacy 

## 2019-09-17 NOTE — Discharge Instructions (Addendum)
Vaginal self-swab obtained.  We will follow up with you regarding abnormal results Declines HIV/ syphilis testing today Prescribed metronidazole 500 mg twice daily for 7 days (do not take while consuming alcohol and/or if breastfeeding) If tests results are positive, please abstain from sexual activity until you and your partner(s) have been treated Follow up with PCP as needed Return here or go to ER if you have any new or worsening symptoms fever, chills, nausea, vomiting, abdominal or pelvic pain, painful intercourse, vaginal discharge, vaginal bleeding, persistent symptoms despite treatment, etc..Marland Kitchen

## 2019-09-18 ENCOUNTER — Telehealth: Payer: Self-pay

## 2019-09-18 LAB — CERVICOVAGINAL ANCILLARY ONLY
Bacterial Vaginitis (gardnerella): POSITIVE — AB
Candida Glabrata: NEGATIVE
Candida Vaginitis: POSITIVE — AB
Chlamydia: NEGATIVE
Comment: NEGATIVE
Comment: NEGATIVE
Comment: NEGATIVE
Comment: NEGATIVE
Comment: NEGATIVE
Comment: NORMAL
Neisseria Gonorrhea: NEGATIVE
Trichomonas: NEGATIVE

## 2019-09-18 MED ORDER — FLUCONAZOLE 200 MG PO TABS: 200.0000 mg | ORAL_TABLET | Freq: Every day | ORAL | 0 refills | Status: AC

## 2019-10-03 ENCOUNTER — Ambulatory Visit (INDEPENDENT_AMBULATORY_CARE_PROVIDER_SITE_OTHER): Payer: Medicaid Other | Admitting: *Deleted

## 2019-10-03 DIAGNOSIS — Z3042 Encounter for surveillance of injectable contraceptive: Secondary | ICD-10-CM | POA: Diagnosis not present

## 2019-10-03 MED ORDER — MEDROXYPROGESTERONE ACETATE 150 MG/ML IM SUSP
150.0000 mg | Freq: Once | INTRAMUSCULAR | Status: AC
  Administered 2019-10-03: 150 mg via INTRAMUSCULAR

## 2019-10-03 NOTE — Progress Notes (Signed)
° °  NURSE VISIT- INJECTION  SUBJECTIVE:  [name] is a 34 y.o. [id number] female here for a Depo Provera for contraception/period management. She is a GYN patient.   OBJECTIVE:  There were no vitals taken for this visit.  Appears well, in no apparent distress  Injection administered in: Right upper quad. gluteus  Meds ordered this encounter  Medications   medroxyPROGESTERone (DEPO-PROVERA) injection 150 mg    ASSESSMENT: GYN patient Depo Provera for contraception/period management PLAN: Follow-up: in 11-13 weeks for next Depo   Nance Pear  10/03/2019 1:50 PM

## 2019-10-10 ENCOUNTER — Other Ambulatory Visit (HOSPITAL_COMMUNITY)
Admission: RE | Admit: 2019-10-10 | Discharge: 2019-10-10 | Disposition: A | Discharge status: Home / Self Care | Payer: Medicaid Other | Source: Ambulatory Visit | Attending: Obstetrics & Gynecology | Admitting: Obstetrics & Gynecology

## 2019-10-10 ENCOUNTER — Other Ambulatory Visit (INDEPENDENT_AMBULATORY_CARE_PROVIDER_SITE_OTHER): Payer: Medicaid Other | Admitting: *Deleted

## 2019-10-10 DIAGNOSIS — N898 Other specified noninflammatory disorders of vagina: Secondary | ICD-10-CM | POA: Insufficient documentation

## 2019-10-10 DIAGNOSIS — L292 Pruritus vulvae: Secondary | ICD-10-CM | POA: Diagnosis not present

## 2019-10-10 NOTE — Progress Notes (Signed)
Chart reviewed for nurse visit. Agree with plan of care.  Adline Potter, NP 10/10/2019 11:23 AM

## 2019-10-10 NOTE — Progress Notes (Signed)
   NURSE VISIT- VAGINITIS/STD/POC  SUBJECTIVE:  [name] is a 34 y.o. H7S1423 GYN patientfemale here for a vaginal swab for vaginitis screening, STD screen.  She reports the following symptoms: odor and vulvar itching for 1 week.  Recent treatment for BV and yeast from Urgent Care but symptoms still exist. Denies abnormal vaginal bleeding, significant pelvic pain, fever, or UTI symptoms.  OBJECTIVE:  There were no vitals taken for this visit.  Appears well, in no apparent distress  ASSESSMENT: Vaginal swab for vaginitis screening /STD  PLAN: Self-collected vaginal probe for Gonorrhea, Chlamydia, Trichomonas, Bacterial Vaginosis, Yeast sent to lab Treatment: to be determined once results are received Follow-up as needed if symptoms persist/worsen, or new symptoms develop  Jobe Marker  10/10/2019 10:58 AM

## 2019-10-11 ENCOUNTER — Other Ambulatory Visit: Payer: Self-pay | Admitting: Adult Health

## 2019-10-11 LAB — CERVICOVAGINAL ANCILLARY ONLY
Bacterial Vaginitis (gardnerella): POSITIVE — AB
Candida Glabrata: NEGATIVE
Candida Vaginitis: POSITIVE — AB
Chlamydia: NEGATIVE
Comment: NEGATIVE
Comment: NEGATIVE
Comment: NEGATIVE
Comment: NEGATIVE
Comment: NEGATIVE
Comment: NORMAL
Neisseria Gonorrhea: NEGATIVE
Trichomonas: NEGATIVE

## 2019-10-11 MED ORDER — METRONIDAZOLE 500 MG PO TABS: 500.0000 mg | ORAL_TABLET | Freq: Two times a day (BID) | ORAL | 1 refills | Status: DC

## 2019-10-11 NOTE — Progress Notes (Signed)
+  BV on CV swab rx flagyl  °

## 2019-11-07 ENCOUNTER — Other Ambulatory Visit: Payer: Self-pay | Admitting: Adult Health

## 2019-11-07 MED ORDER — METRONIDAZOLE 0.75 % VA GEL: 1.0000 | Freq: Every day | VAGINAL | 0 refills | Status: DC

## 2019-11-07 NOTE — Progress Notes (Signed)
rx metrogel, pt preference over flagyl

## 2019-11-22 ENCOUNTER — Encounter (HOSPITAL_COMMUNITY): Payer: Self-pay | Admitting: *Deleted

## 2019-11-22 ENCOUNTER — Other Ambulatory Visit: Payer: Self-pay

## 2019-11-22 ENCOUNTER — Emergency Department (HOSPITAL_COMMUNITY)
Admission: EM | Admit: 2019-11-22 | Discharge: 2019-11-22 | Disposition: A | Discharge status: Home / Self Care | Payer: Medicaid Other | Attending: Emergency Medicine | Admitting: Emergency Medicine

## 2019-11-22 DIAGNOSIS — B9789 Other viral agents as the cause of diseases classified elsewhere: Secondary | ICD-10-CM | POA: Diagnosis not present

## 2019-11-22 DIAGNOSIS — Z87891 Personal history of nicotine dependence: Secondary | ICD-10-CM | POA: Insufficient documentation

## 2019-11-22 DIAGNOSIS — J069 Acute upper respiratory infection, unspecified: Secondary | ICD-10-CM | POA: Insufficient documentation

## 2019-11-22 DIAGNOSIS — U071 COVID-19: Secondary | ICD-10-CM | POA: Diagnosis not present

## 2019-11-22 DIAGNOSIS — R509 Fever, unspecified: Secondary | ICD-10-CM | POA: Diagnosis present

## 2019-11-22 LAB — RESPIRATORY PANEL BY RT PCR (FLU A&B, COVID)
Influenza A by PCR: NEGATIVE
Influenza B by PCR: NEGATIVE
SARS Coronavirus 2 by RT PCR: POSITIVE — AB

## 2019-11-22 MED ORDER — ACETAMINOPHEN 325 MG PO TABS
650.0000 mg | ORAL_TABLET | Freq: Once | ORAL | Status: AC
  Administered 2019-11-22: 650 mg via ORAL
  Filled 2019-11-22: qty 2

## 2019-11-22 NOTE — ED Provider Notes (Signed)
[facility] EMERGENCY DEPARTMENT Provider Note   CSN: [id number] Arrival date & time: 11/22/19  1124     History Chief Complaint  Patient presents with  . Shortness of Breath    [name] is a 34 y.o. female.  HPI   Patient with no significant medical history presents to the emergency department with chief complaint of general malaise, fever, cough, congestion, headaches x6 days.  Patient states her kids have been sick for the last 2 weeks with a viral URI.  And now she is starting to feel ill.  She states she has intermittent headache which she feels on her head, a nonproductive cough, nasal congestion, earaches, general body pain as well as decreased appetite.  She denies nausea, vomiting, diarrhea, shortness of breath, chest pain.  Patient states she has been taking Tylenol and Benadryl without any relief.  Patient states she is not Covid vaccinated, denies any recent exposures or recent travels.  Patient denies sore throat, chest pain, shortness of breath, abdominal pain, nausea, vomiting, diarrhea, dysuria, pedal edema.  Past Medical History:  Diagnosis Date  . BV (bacterial vaginosis) 04/10/2015  . Depression   . HSV infection   . Irregular bleeding 01/17/2013  . Urinary frequency 04/10/2015  . Vaginal discharge 04/19/2013  . Vaginal odor 04/10/2015  . Vitamin D deficiency 04/11/2015  . Yeast infection 04/19/2013    Patient Active Problem List   Diagnosis Date Noted  . Urinary frequency 08/28/2019  . Encounter for initial prescription of injectable contraceptive 03/23/2019  . Screen for STD (sexually transmitted disease) 03/23/2019  . Pregnancy examination or test, negative result 03/23/2019  . Vaginal irritation 02/28/2019  . Encounter for gynecological examination with Papanicolaou smear of cervix 01/08/2019  . Routine medical exam 01/08/2019  . Vulvar itching 12/25/2018  . Vaginal discharge 12/04/2018  . Vulvar irritation 12/04/2018  . Vaginal odor 12/04/2018  .  Pilonidal cyst   . History of gestational diabetes 02/15/2017  . Vitamin D deficiency 04/11/2015  . Bacterial vaginosis 04/10/2015  . S/P cholecystectomy 06/19/2013  . 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 Hypokalemia 09/02/2012    Past Surgical History:  Procedure Laterality Date  . CHOLECYSTECTOMY N/A 09/05/2012   Procedure: LAPAROSCOPIC CHOLECYSTECTOMY;  Surgeon: Emelia Loron, MD;  Location: [facility] OR;  Service: General;  Laterality: N/A;  . ERCP N/A 09/04/2012   Procedure: ENDOSCOPIC RETROGRADE CHOLANGIOPANCREATOGRAPHY (ERCP);  Surgeon: Rachael Fee, MD;  Location: [facility] OR;  Service: Endoscopy;  Laterality: N/A;  . PILONIDAL CYST EXCISION N/A 01/04/2018   Procedure: CYST EXCISION PILONIDAL SIMPLE;  Surgeon: Franky Macho, MD;  Location: [facility] ORS;  Service: General;  Laterality: N/A;  low back  . WISDOM TOOTH EXTRACTION       OB History    Gravida  5   Para  5   Term  5   Preterm      AB      Living  5     SAB      TAB      Ectopic      Multiple  0   Live Births  5           Family History  Problem Relation Age of Onset  . CAD Maternal Grandmother   . Diabetes Maternal Grandmother   . Hypertension Mother   . Diabetes Mother   . Breast cancer Maternal Aunt   . Hypertension Maternal Grandfather   . Stroke Maternal Grandfather     Social History   Tobacco Use  .  Smoking status: Former Smoker    Years: 10.00    Types: Cigarettes, Cigars    Quit date: 08/29/2012    Years since quitting: 7.2  . Smokeless tobacco: Never Used  . Tobacco comment: 1-2 cigars daily  Vaping Use  . Vaping Use: Never used  Substance Use Topics  . Alcohol use: No  . Drug use: Not Currently    Comment: pt denies    Home Medications Prior to Admission medications   Medication Sig Start Date End Date Taking? Authorizing Provider  fluconazole (DIFLUCAN) 150 MG tablet TAKE 1 TABLET BY MOUTH NOW. REPEAT 1 IN 3 DAYS 10/11/19   Adline Potter, NP  medroxyPROGESTERone  (DEPO-PROVERA) 150 MG/ML injection Inject 1 mL (150 mg total) into the muscle every 3 (three) months. 03/23/19   Adline Potter, NP  metroNIDAZOLE (METROGEL VAGINAL) 0.75 % vaginal gel Place 1 Applicatorful vaginally at bedtime. 11/07/19   Adline Potter, NP    Allergies    Patient has no known allergies.  Review of Systems   Review of Systems  Constitutional: Positive for appetite change, chills and fever.  HENT: Positive for congestion and ear pain. Negative for sore throat and trouble swallowing.   Eyes: Negative for visual disturbance.  Respiratory: Positive for cough. Negative for shortness of breath.   Cardiovascular: Negative for chest pain and palpitations.  Gastrointestinal: Negative for abdominal pain, nausea and vomiting.  Genitourinary: Negative for dysuria, enuresis, flank pain and vaginal bleeding.  Musculoskeletal: Negative for back pain.  Skin: Negative for rash.  Neurological: Positive for headaches. Negative for dizziness, light-headedness and numbness.  Hematological: Does not bruise/bleed easily.    Physical Exam Updated Vital Signs BP 118/64   Pulse (!) 111   Temp (!) 101.8 F (38.8 C) (Oral)   Resp 20   Ht 5\' 4"  (1.626 m)   Wt 93 kg   SpO2 98%   BMI 35.19 kg/m   Physical Exam Vitals and nursing note reviewed.  Constitutional:      General: She is not in acute distress.    Appearance: Normal appearance. She is not ill-appearing or diaphoretic.  HENT:     Head: Normocephalic and atraumatic.     Right Ear: Tympanic membrane, ear canal and external ear normal.     Left Ear: Tympanic membrane, ear canal and external ear normal.     Nose: Congestion present. No rhinorrhea.     Mouth/Throat:     Mouth: Mucous membranes are moist.     Pharynx: Oropharynx is clear. No oropharyngeal exudate or posterior oropharyngeal erythema.  Eyes:     General: No visual field deficit or scleral icterus.    Conjunctiva/sclera: Conjunctivae normal.     Pupils:  Pupils are equal, round, and reactive to light.  Cardiovascular:     Rate and Rhythm: Normal rate and regular rhythm.     Pulses: Normal pulses.     Heart sounds: No murmur heard.  No friction rub. No gallop.   Pulmonary:     Effort: Pulmonary effort is normal. No respiratory distress.     Breath sounds: No wheezing, rhonchi or rales.  Abdominal:     General: There is no distension.     Palpations: Abdomen is soft.     Tenderness: There is no abdominal tenderness. There is no right CVA tenderness, left CVA tenderness or guarding.  Musculoskeletal:        General: No swelling or tenderness.     Cervical back: No rigidity  or tenderness.     Right lower leg: No edema.     Left lower leg: No edema.  Skin:    General: Skin is warm and dry.     Capillary Refill: Capillary refill takes less than 2 seconds.     Findings: No rash.  Neurological:     General: No focal deficit present.     Mental Status: She is alert.     GCS: GCS eye subscore is 4. GCS verbal subscore is 5. GCS motor subscore is 6.     Cranial Nerves: Cranial nerves are intact. No cranial nerve deficit or facial asymmetry.     Sensory: Sensation is intact. No sensory deficit.     Motor: Motor function is intact. No weakness or pronator drift.     Coordination: Coordination is intact. Romberg sign negative. Finger-Nose-Finger Test and Heel to [facility] Test normal.  Psychiatric:        Mood and Affect: Mood normal.     ED Results / Procedures / Treatments   Labs (all labs ordered are listed, but only abnormal results are displayed) Labs Reviewed  RESPIRATORY PANEL BY RT PCR (FLU A&B, COVID) - Abnormal; Notable for the following components:      Result Value   SARS Coronavirus 2 by RT PCR POSITIVE (*)    All other components within normal limits    EKG None  Radiology No results found.  Procedures Procedures (including critical care time)  Medications Ordered in ED Medications  acetaminophen (TYLENOL) tablet  650 mg (650 mg Oral Given 11/22/19 1244)    ED Course  I have reviewed the triage vital signs and the nursing notes.  Pertinent labs & imaging results that were available during my care of the patient were reviewed by me and considered in my medical decision making (see chart for details).    MDM Rules/Calculators/A&P                          I have personally reviewed all imaging, labs and have interpreted them.  Patient presents with general malaise, fever, chills, cough, congestion x6 days.  Patient did not appear to be any acute distress, no signs of respiratory failure, vital signs significant for fever 101.8 as well as tachycardia.  Will order respiratory panel as well as provide Tylenol.  Patient's respiratory panel was positive for Covid.  Will contact infusion clinic as patient has a BMI of 35 and would benefit from infusion.  I have low suspicion patient will have to be hospitalized for Covid as she has no new oxygen requirements, she was nontachypneic, satting at 98% room air, she has very little risk factors other than a BMI of 35.  Low suspicion for pneumonia as lung sounds were clear bilaterally, no rales, rhonchi's, wheezing heard on exam.  Symptoms are more classic of viral URI as she complains of general body aches, sore throat, nonproductive cough.  Low suspicion for PE as patient denies pleuritic chest pain, shortness of breath, leg swelling, leg pain, no recent immobilization, recent surgeries, low risk factors.  Low suspicion for acute cardiac abnormality as patient denies chest pain, shortness of breath, no signs of hypoperfusion or fluid overload noted on exam.  Low suspicion for systemic infection as patient was nontoxic-appearing, no obvious source of infection on exam.  I suspect tachycardia and fever secondary to her Covid.  Will provide her with Tylenol. My suspicion patient suffering from Covid virus, will refer  her to the infusion clinic for an infusion, and provide her  with post Covid care.  Patient resting comfortably, vital signs reassuring, no indication for hospital admission.  Patient was given at home care as well strict return precautions.  Patient verbalized that she understood and agreed to plan. Final Clinical Impression(s) / ED Diagnoses Final diagnoses:  Viral upper respiratory tract infection  COVID-19 virus infection    Rx / DC Orders ED Discharge Orders    None       Carroll SageFaulkner, Siedah Sedor J, PA-C 11/22/19 1406    Linwood DibblesKnapp, Jon, MD 11/23/19 [phone]

## 2019-11-22 NOTE — ED Notes (Signed)
Attempted to call pt with positive covid results. No answer and voicemail is full

## 2019-11-22 NOTE — ED Triage Notes (Signed)
Cough, body weakness, headache for 6 days

## 2019-11-22 NOTE — Discharge Instructions (Addendum)
You have been seen here for fevers, chills, general malaise.  Vitals and exam look reassuring.  I recommend taking over-the-counter medications like Tylenol for fever control and ibuprofen for pain control.  You may also take Claritin as this can help with your nasal decongestion.  Please remain hydrated, if you have a lack of appetite please have things like soups as this will provide you with some fluid as well as some calories as well.  Your Covid test is pending at this time I would like you to remain self quarantine until you get results back on my chart.  If you are Covid positive I want you to contact the post Covid care number they can provide you more instruction and treatment options with your Covid.  If you are Covid positive you must remain self quarantine for 10 days on symptom onset.  I want you to follow-up with your primary care provider in 7 days time if symptoms not fully resolved.  I want to come back to emergency department if you develop chest pain, shortness of breath, severe abdominal pain, uncontrolled nausea, vomiting, diarrhea as these symptoms require further evaluation management.

## 2019-11-23 ENCOUNTER — Telehealth: Payer: Self-pay

## 2019-11-23 ENCOUNTER — Telehealth: Payer: Self-pay | Admitting: Infectious Diseases

## 2019-11-23 NOTE — Telephone Encounter (Signed)
Transition Care Management Unsuccessful Follow-up Telephone Call  Date of discharge and from where:  11/22/2019 from [facility]  Attempts:  1st Attempt  Reason for unsuccessful TCM follow-up call:  Unable to leave message

## 2019-11-23 NOTE — Telephone Encounter (Signed)
Called to Discuss with patient about Covid symptoms and the use of the monoclonal antibody infusion for those with mild to moderate Covid symptoms and at a high risk of hospitalization.     Pt appears to qualify for this infusion due to co-morbid conditions and/or a member of an at-risk group in accordance with the FDA Emergency Use Authorization.    Unable to reach pt - unable to LVM, secure text message and MyChart sent.   Syptoms started 7d ago per ER visit  Qualifiers include high SVI and BMI > 25.    Rexene Alberts, MSN, NP-C [facility]  [email or web address] Pager: [phone] Office: [phone] [facility] Main Line: [phone]

## 2019-11-24 ENCOUNTER — Encounter: Payer: Self-pay | Admitting: Unknown Physician Specialty

## 2019-11-24 ENCOUNTER — Telehealth: Payer: Self-pay | Admitting: Unknown Physician Specialty

## 2019-11-24 ENCOUNTER — Other Ambulatory Visit: Payer: Self-pay | Admitting: Unknown Physician Specialty

## 2019-11-24 ENCOUNTER — Ambulatory Visit (HOSPITAL_COMMUNITY)
Admission: RE | Admit: 2019-11-24 | Discharge: 2019-11-24 | Disposition: A | Discharge status: Home / Self Care | Payer: Medicaid Other | Source: Ambulatory Visit | Attending: Pulmonary Disease | Admitting: Pulmonary Disease

## 2019-11-24 DIAGNOSIS — U071 COVID-19: Secondary | ICD-10-CM | POA: Diagnosis not present

## 2019-11-24 DIAGNOSIS — E663 Overweight: Secondary | ICD-10-CM | POA: Diagnosis not present

## 2019-11-24 MED ORDER — FAMOTIDINE IN NACL 20-0.9 MG/50ML-% IV SOLN: 20.0000 mg | Freq: Once | INTRAVENOUS | Status: DC | PRN

## 2019-11-24 MED ORDER — METHYLPREDNISOLONE SODIUM SUCC 125 MG IJ SOLR: 125.0000 mg | Freq: Once | INTRAMUSCULAR | Status: DC | PRN

## 2019-11-24 MED ORDER — SODIUM CHLORIDE 0.9 % IV SOLN: INTRAVENOUS | Status: DC | PRN

## 2019-11-24 MED ORDER — ALBUTEROL SULFATE HFA 108 (90 BASE) MCG/ACT IN AERS: 2.0000 | INHALATION_SPRAY | Freq: Once | RESPIRATORY_TRACT | Status: DC | PRN

## 2019-11-24 MED ORDER — SODIUM CHLORIDE 0.9 % IV SOLN
1200.0000 mg | Freq: Once | INTRAVENOUS | Status: AC
  Administered 2019-11-24: 1200 mg via INTRAVENOUS

## 2019-11-24 MED ORDER — DIPHENHYDRAMINE HCL 50 MG/ML IJ SOLN: 50.0000 mg | Freq: Once | INTRAMUSCULAR | Status: DC | PRN

## 2019-11-24 MED ORDER — EPINEPHRINE 0.3 MG/0.3ML IJ SOAJ: 0.3000 mg | Freq: Once | INTRAMUSCULAR | Status: DC | PRN

## 2019-11-24 NOTE — Discharge Instructions (Signed)

## 2019-11-24 NOTE — Progress Notes (Signed)
  Diagnosis: COVID-19  Physician: Dr. Wright  Procedure: Covid Infusion Clinic Med: casirivimab\imdevimab infusion - Provided patient with casirivimab\imdevimab fact sheet for patients, parents and caregivers prior to infusion.  Complications: No immediate complications noted.  Discharge: Discharged home   Desmen Schoffstall M Renay Crammer 11/24/2019   

## 2019-11-24 NOTE — Telephone Encounter (Signed)
I connected by phone with [name] on 11/24/2019 at 1:49 PM to discuss the potential use of a new treatment for mild to moderate COVID-19 viral infection in non-hospitalized patients.  This patient is a 34 y.o. female that meets the FDA criteria for Emergency Use Authorization of COVID monoclonal antibody casirivimab/imdevimab or bamlanivimab/eteseviamb.  Has a (+) direct SARS-CoV-2 viral test result  Has mild or moderate COVID-19   Is NOT hospitalized due to COVID-19  Is within 10 days of symptom onset  Has at least one of the high risk factor(s) for progression to severe COVID-19 and/or hospitalization as defined in EUA.  Specific high risk criteria : BMI > 25   I have spoken and communicated the following to the patient or parent/caregiver regarding COVID monoclonal antibody treatment:  1. FDA has authorized the emergency use for the treatment of mild to moderate COVID-19 in adults and pediatric patients with positive results of direct SARS-CoV-2 viral testing who are 20 years of age and older weighing at least 40 kg, and who are at high risk for progressing to severe COVID-19 and/or hospitalization.  2. The significant known and potential risks and benefits of COVID monoclonal antibody, and the extent to which such potential risks and benefits are unknown.  3. Information on available alternative treatments and the risks and benefits of those alternatives, including clinical trials.  4. Patients treated with COVID monoclonal antibody should continue to self-isolate and use infection control measures (e.g., wear mask, isolate, social distance, avoid sharing personal items, clean and disinfect "high touch" surfaces, and frequent handwashing) according to CDC guidelines.   5. The patient or parent/caregiver has the option to accept or refuse COVID monoclonal antibody treatment.  After reviewing this information with the patient, the patient has agreed to receive one of the  available covid 19 monoclonal antibodies and will be provided an appropriate fact sheet prior to infusion. Gabriel Cirri, NP 11/24/2019 1:49 PM  Sx onset 9/15

## 2019-11-26 NOTE — Telephone Encounter (Signed)
Transition Care Management Unsuccessful Follow-up Telephone Call  Date of discharge and from where:  11/22/2019 from [facility]  Attempts:  2nd Attempt  Reason for unsuccessful TCM follow-up call:  Unable to leave message

## 2019-11-27 NOTE — Telephone Encounter (Signed)
Transition Care Management Unsuccessful Follow-up Telephone Call  Date of discharge and from where:  11/22/2019 from [facility]  Attempts:  3rd Attempt  Reason for unsuccessful TCM follow-up call:  Unable to leave message

## 2019-12-01 ENCOUNTER — Telehealth: Payer: Self-pay

## 2019-12-01 NOTE — Telephone Encounter (Signed)
11/22/19 dx sick since 11/15/19-went 11/24/19 and got infusion. - pt stated she has been away from her children since approximately 11/14/19. Pt stated that she wants to see her children. Her children were quarantined and have both tested negative. Advised mother that she can bee with her children now. Prior to agent transferring call, Agent Karen sent message to post covid clinic to call pt to set up appt. Pt verbalized understanding.

## 2019-12-26 ENCOUNTER — Ambulatory Visit (INDEPENDENT_AMBULATORY_CARE_PROVIDER_SITE_OTHER): Payer: Medicaid Other | Admitting: *Deleted

## 2019-12-26 ENCOUNTER — Other Ambulatory Visit: Payer: Self-pay

## 2019-12-26 DIAGNOSIS — Z3042 Encounter for surveillance of injectable contraceptive: Secondary | ICD-10-CM | POA: Diagnosis not present

## 2019-12-26 MED ORDER — MEDROXYPROGESTERONE ACETATE 150 MG/ML IM SUSP
150.0000 mg | Freq: Once | INTRAMUSCULAR | Status: AC
  Administered 2019-12-26: 150 mg via INTRAMUSCULAR

## 2019-12-26 NOTE — Progress Notes (Signed)
   NURSE VISIT- INJECTION  SUBJECTIVE:  [name] is a 34 y.o. [id number] female here for a Depo Provera for contraception/period management. She is a GYN patient.   OBJECTIVE:  There were no vitals taken for this visit.  Appears well, in no apparent distress  Injection administered in: Left upper quad. gluteus  Meds ordered this encounter  Medications  . medroxyPROGESTERone (DEPO-PROVERA) injection 150 mg    ASSESSMENT: GYN patient Depo Provera for contraception/period management PLAN: Follow-up: in 11-13 weeks for next Depo   [name]  12/26/2019 1:47 PM

## 2020-02-22 ENCOUNTER — Emergency Department (HOSPITAL_COMMUNITY)
Admission: EM | Admit: 2020-02-22 | Discharge: 2020-02-22 | Disposition: A | Discharge status: Home / Self Care | Payer: Medicaid Other | Attending: Emergency Medicine | Admitting: Emergency Medicine

## 2020-02-22 ENCOUNTER — Other Ambulatory Visit: Payer: Self-pay

## 2020-02-22 ENCOUNTER — Encounter (HOSPITAL_COMMUNITY): Payer: Self-pay

## 2020-02-22 DIAGNOSIS — M79644 Pain in right finger(s): Secondary | ICD-10-CM | POA: Diagnosis present

## 2020-02-22 DIAGNOSIS — L03011 Cellulitis of right finger: Secondary | ICD-10-CM | POA: Insufficient documentation

## 2020-02-22 DIAGNOSIS — Z87891 Personal history of nicotine dependence: Secondary | ICD-10-CM | POA: Insufficient documentation

## 2020-02-22 MED ORDER — SULFAMETHOXAZOLE-TRIMETHOPRIM 800-160 MG PO TABS
1.0000 | ORAL_TABLET | Freq: Once | ORAL | Status: AC
  Administered 2020-02-22: 20:00:00 1 via ORAL
  Filled 2020-02-22: qty 1

## 2020-02-22 MED ORDER — DOXYCYCLINE HYCLATE 100 MG PO CAPS: 100.0000 mg | ORAL_CAPSULE | Freq: Two times a day (BID) | ORAL | 0 refills | Status: AC

## 2020-02-22 MED ORDER — SULFAMETHOXAZOLE-TRIMETHOPRIM 800-160 MG PO TABS: 1.0000 | ORAL_TABLET | Freq: Two times a day (BID) | ORAL | 0 refills | Status: AC

## 2020-02-22 MED ORDER — DIPHENHYDRAMINE HCL 25 MG PO CAPS
25.0000 mg | ORAL_CAPSULE | Freq: Once | ORAL | Status: AC
  Administered 2020-02-22: 20:00:00 25 mg via ORAL
  Filled 2020-02-22: qty 1

## 2020-02-22 NOTE — ED Provider Notes (Signed)
[facility] EMERGENCY DEPARTMENT Provider Note   CSN: [id number] Arrival date & time: 02/22/20  7408     History Chief Complaint  Patient presents with  . Hand Pain    [name] is a 34 y.o. female.  34 year old female presents with concern for infection to her right fourth finger.  Patient states that she went to a nail salon the other day and when her nail was removed she noticed a hole in the nail to her mid nailbed.  Patient has since noticed worsening pain in the finger with a pustule under the nail.  No active drainage.  No pain with range of motion.  No other complaints or concerns.        Past Medical History:  Diagnosis Date  . BV (bacterial vaginosis) 04/10/2015  . Depression   . HSV infection   . Irregular bleeding 01/17/2013  . Urinary frequency 04/10/2015  . Vaginal discharge 04/19/2013  . Vaginal odor 04/10/2015  . Vitamin D deficiency 04/11/2015  . Yeast infection 04/19/2013    Patient Active Problem List   Diagnosis Date Noted  . Urinary frequency 08/28/2019  . Encounter for initial prescription of injectable contraceptive 03/23/2019  . Screen for STD (sexually transmitted disease) 03/23/2019  . Pregnancy examination or test, negative result 03/23/2019  . Vaginal irritation 02/28/2019  . Encounter for gynecological examination with Papanicolaou smear of cervix 01/08/2019  . Routine medical exam 01/08/2019  . Vulvar itching 12/25/2018  . Vaginal discharge 12/04/2018  . Vulvar irritation 12/04/2018  . Vaginal odor 12/04/2018  . Pilonidal cyst   . History of gestational diabetes 02/15/2017  . Vitamin D deficiency 04/11/2015  . Bacterial vaginosis 04/10/2015  . S/P cholecystectomy 06/19/2013  . 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 Hypokalemia 09/02/2012    Past Surgical History:  Procedure Laterality Date  . CHOLECYSTECTOMY N/A 09/05/2012   Procedure: LAPAROSCOPIC CHOLECYSTECTOMY;  Surgeon: Emelia Loron, MD;  Location: [facility] OR;  Service: General;   Laterality: N/A;  . ERCP N/A 09/04/2012   Procedure: ENDOSCOPIC RETROGRADE CHOLANGIOPANCREATOGRAPHY (ERCP);  Surgeon: Rachael Fee, MD;  Location: [facility] OR;  Service: Endoscopy;  Laterality: N/A;  . PILONIDAL CYST EXCISION N/A 01/04/2018   Procedure: CYST EXCISION PILONIDAL SIMPLE;  Surgeon: Franky Macho, MD;  Location: [facility] ORS;  Service: General;  Laterality: N/A;  low back  . WISDOM TOOTH EXTRACTION       OB History    Gravida  5   Para  5   Term  5   Preterm      AB      Living  5     SAB      IAB      Ectopic      Multiple  0   Live Births  5           Family History  Problem Relation Age of Onset  . CAD Maternal Grandmother   . Diabetes Maternal Grandmother   . Hypertension Mother   . Diabetes Mother   . Breast cancer Maternal Aunt   . Hypertension Maternal Grandfather   . Stroke Maternal Grandfather     Social History   Tobacco Use  . Smoking status: Former Smoker    Years: 10.00    Types: Cigarettes, Cigars    Quit date: 08/29/2012    Years since quitting: 7.4  . Smokeless tobacco: Never Used  . Tobacco comment: 1-2 cigars daily  Vaping Use  . Vaping Use: Never used  Substance Use Topics  .  Alcohol use: No  . Drug use: Never    Home Medications Prior to Admission medications   Medication Sig Start Date End Date Taking? Authorizing Provider  medroxyPROGESTERone (DEPO-PROVERA) 150 MG/ML injection Inject 1 mL (150 mg total) into the muscle every 3 (three) months. 03/23/19  Yes Cyril Mourning A, NP  VITAMIN D PO Take 1 tablet by mouth daily.   Yes [provider]  doxycycline (VIBRAMYCIN) 100 MG capsule Take 1 capsule (100 mg total) by mouth 2 (two) times daily for 7 days. 02/22/20 02/29/20  Jeannie Fend, PA-C  fluconazole (DIFLUCAN) 150 MG tablet TAKE 1 TABLET BY MOUTH NOW. REPEAT 1 IN 3 DAYS Patient not taking: Reported on 02/22/2020 10/11/19   Cyril Mourning A, NP  metroNIDAZOLE (METROGEL VAGINAL) 0.75 % vaginal gel Place 1  Applicatorful vaginally at bedtime. Patient not taking: Reported on 02/22/2020 11/07/19   Adline Potter, NP  sulfamethoxazole-trimethoprim (BACTRIM DS) 800-160 MG tablet Take 1 tablet by mouth 2 (two) times daily for 7 days. 02/22/20 02/29/20  Jeannie Fend, PA-C    Allergies    Bactrim [sulfamethoxazole-trimethoprim]  Review of Systems   Review of Systems  Constitutional: Negative for fever.  Musculoskeletal: Negative for arthralgias and myalgias.  Skin: Positive for wound. Negative for color change.  Allergic/Immunologic: Negative for immunocompromised state.  Neurological: Negative for weakness and numbness.    Physical Exam Updated Vital Signs BP 138/69 (BP Location: Left Arm)   Pulse 93   Temp 99.1 F (37.3 C) (Oral)   Resp 18   Ht 5\' 4"  (1.626 m)   Wt 97.5 kg   SpO2 98%   BMI 36.90 kg/m   Physical Exam Vitals and nursing note reviewed.  Constitutional:      General: She is not in acute distress.    Appearance: She is well-developed and well-nourished. She is not diaphoretic.  HENT:     Head: Normocephalic and atraumatic.  Pulmonary:     Effort: Pulmonary effort is normal.  Musculoskeletal:        General: Tenderness present. No swelling or deformity. Normal range of motion.  Skin:    General: Skin is warm and dry.     Capillary Refill: Capillary refill takes less than 2 seconds.     Findings: No erythema.  Neurological:     Mental Status: She is alert and oriented to person, place, and time.  Psychiatric:        Mood and Affect: Mood and affect normal.        Behavior: Behavior normal.     ED Results / Procedures / Treatments   Labs (all labs ordered are listed, but only abnormal results are displayed) Labs Reviewed - No data to display  EKG None  Radiology No results found.  Procedures . Incision and Drainage  Date/Time: 02/22/2020 7:58 PM Performed by: 02/24/2020, PA-C Authorized by: Jeannie Fend, PA-C   Consent:    Consent  obtained:  Verbal   Consent given by:  Patient   Risks discussed:  Bleeding, incomplete drainage, pain and damage to other organs   Alternatives discussed:  No treatment Universal protocol:    Procedure explained and questions answered to patient or proxy's satisfaction: yes     Relevant documents present and verified: yes     Test results available : yes     Imaging studies available: yes     Required blood products, implants, devices, and special equipment available: yes     Site/side marked:  yes     Immediately prior to procedure, a time out was called: yes     Patient identity confirmed:  Verbally with patient Location:    Type:  Abscess   Location:  Upper extremity   Upper extremity location:  Finger   Finger location:  R ring finger Pre-procedure details:    Skin preparation:  Antiseptic wash Sedation:    Sedation type:  None Anesthesia:    Anesthesia method:  None Procedure type:    Complexity:  Simple Procedure details:    Ultrasound guidance: no     Needle aspiration: yes     Incision types:  Stab incision   Incision depth:  Subcutaneous   Scalpel blade:  11   Drainage:  Purulent   Drainage amount:  Moderate   Packing materials:  None Post-procedure details:    Procedure completion:  Tolerated well, no immediate complications   (including critical care time)  Medications Ordered in ED Medications  sulfamethoxazole-trimethoprim (BACTRIM DS) 800-160 MG per tablet 1 tablet (1 tablet Oral Given 02/22/20 1936)  diphenhydrAMINE (BENADRYL) capsule 25 mg (25 mg Oral Given 02/22/20 1958)    ED Course  I have reviewed the triage vital signs and the nursing notes.  Pertinent labs & imaging results that were available during my care of the patient were reviewed by me and considered in my medical decision making (see chart for details).  Clinical Course as of 02/22/20 2032  Fri Feb 22, 2020  752 34 year old female presents with complaint of right fourth finger  infection under her acrylic nail.  On exam, has full range of motion of the finger, no pain with flexion or extension, no pain with palpation along the flexor tendon.  Does have a pustule under the nail which was drained with needle aspiration.  Recommend warm water soaks at home.  Patient was given first dose of Bactrim in the ED resulting in itching.  Patient was given Bactrim, prescription canceled at her pharmacy a new prescription for doxycycline sent to the pharmacy.  Bactrim allergy added to patient's chart. [LM]    Clinical Course User Index [LM] Alden Hipp   MDM Rules/Calculators/A&P                          Final Clinical Impression(s) / ED Diagnoses Final diagnoses:  Paronychia of finger of right hand    Rx / DC Orders ED Discharge Orders         Ordered    sulfamethoxazole-trimethoprim (BACTRIM DS) 800-160 MG tablet  2 times daily        02/22/20 1926    doxycycline (VIBRAMYCIN) 100 MG capsule  2 times daily        02/22/20 1955           Alden Hipp 02/22/20 2032    Geoffery Lyons, MD 02/26/20 1557

## 2020-02-22 NOTE — ED Triage Notes (Signed)
Pt to er, pt states that she is here for some pain and redness in her R ring finger, states that she noticed a pustule under her nail yesterday.

## 2020-02-22 NOTE — Discharge Instructions (Addendum)
Take your antibiotics as prescribed and complete the full course. Warm water soaks. Recheck with orthopedics next week, return to the ER for concerning symptoms.

## 2020-02-25 ENCOUNTER — Telehealth: Payer: Self-pay

## 2020-02-25 NOTE — Telephone Encounter (Signed)
Transition Care Management Follow-up Telephone Call  Date of discharge and from where: 02/22/2020 from [facility]  How have you been since you were released from the hospital? Patient states that she is feeling better and has started her antibiotic.   Any questions or concerns? No  Items Reviewed:  Did the pt receive and understand the discharge instructions provided? Yes   Medications obtained and verified? No   Other? No   Any new allergies since your discharge? No   Dietary orders reviewed? N/A  Do you have support at home? Yes    Functional Questionnaire: (I = Independent and D = Dependent) ADLs: I  Bathing/Dressing- I  Meal Prep- I  Eating- I  Maintaining continence- I  Transferring/Ambulation- I  Managing Meds- I   Follow up appointments reviewed:   PCP Hospital f/u appt confirmed? No    Are transportation arrangements needed? No   If their condition worsens, is the pt aware to call PCP or go to the Emergency Dept.? Yes  Was the patient provided with contact information for the PCP's office or ED? Yes  Was to pt encouraged to call back with questions or concerns? Yes

## 2020-03-12 ENCOUNTER — Ambulatory Visit: Payer: Medicaid Other

## 2020-03-13 ENCOUNTER — Other Ambulatory Visit: Payer: Self-pay

## 2020-03-13 ENCOUNTER — Ambulatory Visit (INDEPENDENT_AMBULATORY_CARE_PROVIDER_SITE_OTHER): Payer: Medicaid Other | Admitting: *Deleted

## 2020-03-13 VITALS — Wt 209.8 lb

## 2020-03-13 DIAGNOSIS — Z3042 Encounter for surveillance of injectable contraceptive: Secondary | ICD-10-CM

## 2020-03-13 MED ORDER — MEDROXYPROGESTERONE ACETATE 150 MG/ML IM SUSP
150.0000 mg | Freq: Once | INTRAMUSCULAR | Status: AC
  Administered 2020-03-13: 150 mg via INTRAMUSCULAR

## 2020-03-13 NOTE — Progress Notes (Signed)
   NURSE VISIT- INJECTION  SUBJECTIVE:  [name] is a 35 y.o. [id number] female here for a Depo Provera for contraception/period management. She is a GYN patient.   OBJECTIVE:  There were no vitals taken for this visit.  Appears well, in no apparent distress  Injection administered in: Right upper quad. gluteus  Meds ordered this encounter  Medications  . medroxyPROGESTERone (DEPO-PROVERA) injection 150 mg    ASSESSMENT: GYN patient Depo Provera for contraception/period management PLAN: Follow-up: in 11-13 weeks for next Depo   Jobe Marker  03/13/2020 11:30 AM

## 2020-03-27 ENCOUNTER — Other Ambulatory Visit: Payer: Self-pay | Admitting: Advanced Practice Midwife

## 2020-03-27 MED ORDER — FLUCONAZOLE 150 MG PO TABS: 150.0000 mg | ORAL_TABLET | Freq: Once | ORAL | 0 refills | Status: AC

## 2020-03-27 MED ORDER — METRONIDAZOLE 0.75 % VA GEL: 1.0000 | Freq: Every day | VAGINAL | 0 refills | Status: DC

## 2020-04-18 ENCOUNTER — Other Ambulatory Visit (HOSPITAL_COMMUNITY)
Admission: RE | Admit: 2020-04-18 | Discharge: 2020-04-18 | Disposition: A | Discharge status: Home / Self Care | Payer: Medicaid Other | Source: Ambulatory Visit | Attending: Obstetrics & Gynecology | Admitting: Obstetrics & Gynecology

## 2020-04-18 ENCOUNTER — Other Ambulatory Visit: Payer: Self-pay

## 2020-04-18 ENCOUNTER — Other Ambulatory Visit (INDEPENDENT_AMBULATORY_CARE_PROVIDER_SITE_OTHER): Payer: Medicaid Other | Admitting: *Deleted

## 2020-04-18 DIAGNOSIS — N898 Other specified noninflammatory disorders of vagina: Secondary | ICD-10-CM | POA: Diagnosis not present

## 2020-04-18 NOTE — Progress Notes (Signed)
Chart reviewed for nurse visit. Agree with plan of care.  Adline Potter, NP 04/18/2020 11:46 AM

## 2020-04-18 NOTE — Progress Notes (Signed)
   NURSE VISIT- VAGINITIS/STD  SUBJECTIVE:  [name] is a 35 y.o. [id number] GYN patientfemale here for a vaginal swab for vaginitis screening, STD screen.  She reports the following symptoms: vaginal odor and itching for 5-6 days. Denies abnormal vaginal bleeding, significant pelvic pain, fever, or UTI symptoms.  OBJECTIVE:  There were no vitals taken for this visit.  Appears well, in no apparent distress  ASSESSMENT: Vaginal swab for vaginitis screening and STD screen.  PLAN: Self-collected vaginal probe for Gonorrhea, Chlamydia, Trichomonas, Bacterial Vaginosis, Yeast sent to lab Treatment: to be determined once results are received Follow-up as needed if symptoms persist/worsen, or new symptoms develop  [name]  04/18/2020 11:26 AM

## 2020-04-22 ENCOUNTER — Telehealth: Payer: Self-pay | Admitting: Adult Health

## 2020-04-22 ENCOUNTER — Encounter: Payer: Self-pay | Admitting: Adult Health

## 2020-04-22 LAB — CERVICOVAGINAL ANCILLARY ONLY
Bacterial Vaginitis (gardnerella): POSITIVE — AB
Candida Glabrata: NEGATIVE
Candida Vaginitis: NEGATIVE
Chlamydia: NEGATIVE
Comment: NEGATIVE
Comment: NEGATIVE
Comment: NEGATIVE
Comment: NEGATIVE
Comment: NEGATIVE
Comment: NORMAL
Neisseria Gonorrhea: NEGATIVE
Trichomonas: NEGATIVE

## 2020-04-22 MED ORDER — METRONIDAZOLE 500 MG PO TABS: 500.0000 mg | ORAL_TABLET | Freq: Two times a day (BID) | ORAL | 0 refills | Status: DC

## 2020-04-22 NOTE — Telephone Encounter (Signed)
Left message that Vaginal swab +BV, negative for GC/CHL/trich and yeast and rx sent in for flagyl, no sex or alcohol during treatment

## 2020-04-25 DIAGNOSIS — Z7689 Persons encountering health services in other specified circumstances: Secondary | ICD-10-CM | POA: Diagnosis not present

## 2020-04-25 DIAGNOSIS — L659 Nonscarring hair loss, unspecified: Secondary | ICD-10-CM | POA: Diagnosis not present

## 2020-04-25 DIAGNOSIS — F419 Anxiety disorder, unspecified: Secondary | ICD-10-CM | POA: Diagnosis not present

## 2020-04-25 DIAGNOSIS — M545 Low back pain, unspecified: Secondary | ICD-10-CM | POA: Diagnosis not present

## 2020-04-30 DIAGNOSIS — Z13220 Encounter for screening for lipoid disorders: Secondary | ICD-10-CM | POA: Diagnosis not present

## 2020-04-30 DIAGNOSIS — E669 Obesity, unspecified: Secondary | ICD-10-CM | POA: Diagnosis not present

## 2020-04-30 DIAGNOSIS — L659 Nonscarring hair loss, unspecified: Secondary | ICD-10-CM | POA: Diagnosis not present

## 2020-04-30 DIAGNOSIS — Z7689 Persons encountering health services in other specified circumstances: Secondary | ICD-10-CM | POA: Diagnosis not present

## 2020-04-30 DIAGNOSIS — M545 Low back pain, unspecified: Secondary | ICD-10-CM | POA: Diagnosis not present

## 2020-04-30 DIAGNOSIS — F419 Anxiety disorder, unspecified: Secondary | ICD-10-CM | POA: Diagnosis not present

## 2020-05-09 DIAGNOSIS — M545 Low back pain, unspecified: Secondary | ICD-10-CM | POA: Diagnosis not present

## 2020-05-09 DIAGNOSIS — F419 Anxiety disorder, unspecified: Secondary | ICD-10-CM | POA: Diagnosis not present

## 2020-05-26 ENCOUNTER — Encounter: Payer: Self-pay | Admitting: Physical Medicine & Rehabilitation

## 2020-06-04 ENCOUNTER — Other Ambulatory Visit: Payer: Self-pay | Admitting: Adult Health

## 2020-06-05 ENCOUNTER — Ambulatory Visit (INDEPENDENT_AMBULATORY_CARE_PROVIDER_SITE_OTHER): Payer: Medicaid Other | Admitting: *Deleted

## 2020-06-05 ENCOUNTER — Other Ambulatory Visit: Payer: Self-pay | Admitting: Adult Health

## 2020-06-05 ENCOUNTER — Other Ambulatory Visit: Payer: Self-pay

## 2020-06-05 DIAGNOSIS — Z3042 Encounter for surveillance of injectable contraceptive: Secondary | ICD-10-CM

## 2020-06-05 MED ORDER — FLUCONAZOLE 150 MG PO TABS: ORAL_TABLET | ORAL | 1 refills | Status: DC

## 2020-06-05 MED ORDER — METRONIDAZOLE 0.75 % VA GEL: 1.0000 | Freq: Every day | VAGINAL | 0 refills | Status: DC

## 2020-06-05 MED ORDER — MEDROXYPROGESTERONE ACETATE 150 MG/ML IM SUSP
150.0000 mg | Freq: Once | INTRAMUSCULAR | Status: AC
  Administered 2020-06-05: 150 mg via INTRAMUSCULAR

## 2020-06-05 NOTE — Progress Notes (Signed)
Pt here for depo, requests Metrogel not flagyl, and diflucan because always gets yeast after metrogel, rx sent for both

## 2020-06-05 NOTE — Progress Notes (Signed)
   NURSE VISIT- INJECTION  SUBJECTIVE:  [name] is a 35 y.o. [id number] female here for a Depo Provera for contraception/period management. She is a GYN patient.   OBJECTIVE:  There were no vitals taken for this visit.  Appears well, in no apparent distress  Injection administered in: Left upper quad. gluteus  Meds ordered this encounter  Medications  . medroxyPROGESTERone (DEPO-PROVERA) injection 150 mg    ASSESSMENT: GYN patient Depo Provera for contraception/period management PLAN: Follow-up: in 11-13 weeks for next Depo   [name]  06/05/2020 10:09 AM

## 2020-06-06 ENCOUNTER — Encounter: Payer: Medicaid Other | Admitting: Physical Medicine & Rehabilitation

## 2020-06-27 ENCOUNTER — Ambulatory Visit: Payer: Medicaid Other | Admitting: Physical Medicine & Rehabilitation

## 2020-07-04 ENCOUNTER — Encounter: Payer: Medicaid Other | Attending: Physical Medicine & Rehabilitation | Admitting: Physical Medicine & Rehabilitation

## 2020-08-22 ENCOUNTER — Ambulatory Visit: Payer: Medicaid Other

## 2020-08-27 ENCOUNTER — Ambulatory Visit (INDEPENDENT_AMBULATORY_CARE_PROVIDER_SITE_OTHER): Payer: Medicaid Other | Admitting: *Deleted

## 2020-08-27 ENCOUNTER — Other Ambulatory Visit: Payer: Self-pay

## 2020-08-27 ENCOUNTER — Other Ambulatory Visit (HOSPITAL_COMMUNITY)
Admission: RE | Admit: 2020-08-27 | Discharge: 2020-08-27 | Disposition: A | Discharge status: Home / Self Care | Payer: Medicaid Other | Source: Ambulatory Visit | Attending: Obstetrics & Gynecology | Admitting: Obstetrics & Gynecology

## 2020-08-27 DIAGNOSIS — Z3042 Encounter for surveillance of injectable contraceptive: Secondary | ICD-10-CM | POA: Diagnosis not present

## 2020-08-27 DIAGNOSIS — Z308 Encounter for other contraceptive management: Secondary | ICD-10-CM

## 2020-08-27 DIAGNOSIS — N898 Other specified noninflammatory disorders of vagina: Secondary | ICD-10-CM

## 2020-08-27 MED ORDER — MEDROXYPROGESTERONE ACETATE 150 MG/ML IM SUSP
150.0000 mg | Freq: Once | INTRAMUSCULAR | Status: AC
  Administered 2020-08-27: 16:00:00 150 mg via INTRAMUSCULAR

## 2020-08-27 NOTE — Progress Notes (Signed)
   NURSE VISIT- VAGINITIS/STD/POC  SUBJECTIVE:  [name] is a 35 y.o. [id number] GYN patientfemale here for a vaginal swab for vaginitis screening, STD screen.  She reports the following symptoms:  vaginal itching and discharge  for few days. Denies abnormal vaginal bleeding, significant pelvic pain, fever, or UTI symptoms.  OBJECTIVE:  There were no vitals taken for this visit.  Appears well, in no apparent distress  ASSESSMENT: Vaginal swab for vaginitis screening & STD screen. Pt also received Depo in right hip.   PLAN: Self-collected vaginal probe for Gonorrhea, Chlamydia, Trichomonas, Bacterial Vaginosis, Yeast sent to lab Treatment: to be determined once results are received Follow-up as needed if symptoms persist/worsen, or new symptoms develop  [name]  08/27/2020 3:49 PM

## 2020-08-27 NOTE — Progress Notes (Signed)
Chart reviewed for nurse visit. Agree with plan of care.  Polette Nofsinger A, NP 08/27/2020 5:12 PM   

## 2020-08-29 LAB — CERVICOVAGINAL ANCILLARY ONLY
Bacterial Vaginitis (gardnerella): NEGATIVE
Candida Glabrata: NEGATIVE
Candida Vaginitis: NEGATIVE
Chlamydia: NEGATIVE
Comment: NEGATIVE
Comment: NEGATIVE
Comment: NEGATIVE
Comment: NEGATIVE
Comment: NEGATIVE
Comment: NORMAL
Neisseria Gonorrhea: NEGATIVE
Trichomonas: NEGATIVE

## 2020-11-19 ENCOUNTER — Other Ambulatory Visit (INDEPENDENT_AMBULATORY_CARE_PROVIDER_SITE_OTHER): Payer: Medicaid Other

## 2020-11-19 ENCOUNTER — Other Ambulatory Visit: Payer: Self-pay

## 2020-11-19 ENCOUNTER — Other Ambulatory Visit (HOSPITAL_COMMUNITY)
Admission: RE | Admit: 2020-11-19 | Discharge: 2020-11-19 | Disposition: A | Discharge status: Home / Self Care | Payer: Medicaid Other | Source: Ambulatory Visit | Attending: Obstetrics & Gynecology | Admitting: Obstetrics & Gynecology

## 2020-11-19 ENCOUNTER — Ambulatory Visit: Payer: Medicaid Other

## 2020-11-19 DIAGNOSIS — N898 Other specified noninflammatory disorders of vagina: Secondary | ICD-10-CM | POA: Insufficient documentation

## 2020-11-19 NOTE — Progress Notes (Signed)
Chart reviewed for nurse visit. Agree with plan of care.  Adline Potter, NP 11/19/2020 5:56 PM

## 2020-11-19 NOTE — Progress Notes (Signed)
   NURSE VISIT- VAGINITIS/STD  SUBJECTIVE:  [name] is a 35 y.o. [id number] GYN patientfemale here for a vaginal swab for vaginitis screening, STD screen.  She reports the following symptoms: burning and vulvar itching for 3 days. Denies abnormal vaginal bleeding, significant pelvic pain, fever, or UTI symptoms.  OBJECTIVE:  There were no vitals taken for this visit.  Appears well, in no apparent distress  ASSESSMENT: Vaginal swab for  vaginitis & STD screening  PLAN: Self-collected vaginal probe for Gonorrhea, Chlamydia, Trichomonas, Bacterial Vaginosis, Yeast sent to lab Treatment: to be determined once results are received Follow-up as needed if symptoms persist/worsen, or new symptoms develop  Kaipo Ardis A Benton Tooker  11/19/2020 3:49 PM

## 2020-11-21 LAB — CERVICOVAGINAL ANCILLARY ONLY
Bacterial Vaginitis (gardnerella): NEGATIVE
Candida Glabrata: NEGATIVE
Candida Vaginitis: POSITIVE — AB
Chlamydia: NEGATIVE
Comment: NEGATIVE
Comment: NEGATIVE
Comment: NEGATIVE
Comment: NEGATIVE
Comment: NEGATIVE
Comment: NORMAL
Neisseria Gonorrhea: NEGATIVE
Trichomonas: NEGATIVE

## 2020-11-24 ENCOUNTER — Other Ambulatory Visit: Payer: Self-pay | Admitting: Adult Health

## 2020-11-24 ENCOUNTER — Other Ambulatory Visit: Payer: Self-pay

## 2020-11-24 ENCOUNTER — Ambulatory Visit (INDEPENDENT_AMBULATORY_CARE_PROVIDER_SITE_OTHER): Payer: Medicaid Other

## 2020-11-24 DIAGNOSIS — Z3042 Encounter for surveillance of injectable contraceptive: Secondary | ICD-10-CM

## 2020-11-24 MED ORDER — MEDROXYPROGESTERONE ACETATE 150 MG/ML IM SUSP
150.0000 mg | Freq: Once | INTRAMUSCULAR | Status: AC
  Administered 2020-11-24: 150 mg via INTRAMUSCULAR

## 2020-11-24 MED ORDER — FLUCONAZOLE 150 MG PO TABS: ORAL_TABLET | ORAL | 1 refills | Status: DC

## 2020-11-24 NOTE — Progress Notes (Signed)
   NURSE VISIT- INJECTION  SUBJECTIVE:  [name] is a 35 y.o. [id number] female here for a Depo Provera for contraception/period management. She is a GYN patient.   OBJECTIVE:  There were no vitals taken for this visit.  Appears well, in no apparent distress  Injection administered in: Left upper quad. gluteus  Meds ordered this encounter  Medications   medroxyPROGESTERone (DEPO-PROVERA) injection 150 mg    ASSESSMENT: GYN patient Depo Provera for contraception/period management PLAN: Follow-up: in 11-13 weeks for next Depo   Elyan Vanwieren A Verlon Carcione  11/24/2020 4:27 PM

## 2021-02-06 DIAGNOSIS — F411 Generalized anxiety disorder: Secondary | ICD-10-CM | POA: Diagnosis not present

## 2021-02-14 DIAGNOSIS — F411 Generalized anxiety disorder: Secondary | ICD-10-CM | POA: Diagnosis not present

## 2021-02-17 ENCOUNTER — Other Ambulatory Visit: Payer: Self-pay

## 2021-02-17 ENCOUNTER — Ambulatory Visit (INDEPENDENT_AMBULATORY_CARE_PROVIDER_SITE_OTHER): Payer: Medicaid Other | Admitting: *Deleted

## 2021-02-17 ENCOUNTER — Other Ambulatory Visit (HOSPITAL_COMMUNITY)
Admission: RE | Admit: 2021-02-17 | Discharge: 2021-02-17 | Disposition: A | Discharge status: Home / Self Care | Payer: Medicaid Other | Source: Ambulatory Visit | Attending: Obstetrics & Gynecology | Admitting: Obstetrics & Gynecology

## 2021-02-17 DIAGNOSIS — N898 Other specified noninflammatory disorders of vagina: Secondary | ICD-10-CM | POA: Diagnosis not present

## 2021-02-17 DIAGNOSIS — Z113 Encounter for screening for infections with a predominantly sexual mode of transmission: Secondary | ICD-10-CM

## 2021-02-17 DIAGNOSIS — Z3042 Encounter for surveillance of injectable contraceptive: Secondary | ICD-10-CM | POA: Diagnosis not present

## 2021-02-17 MED ORDER — MEDROXYPROGESTERONE ACETATE 150 MG/ML IM SUSP
150.0000 mg | Freq: Once | INTRAMUSCULAR | Status: AC
  Administered 2021-02-17: 16:00:00 150 mg via INTRAMUSCULAR

## 2021-02-17 NOTE — Progress Notes (Addendum)
° °  NURSE VISIT- VAGINITIS/STD  SUBJECTIVE:  [name] is a 35 y.o. [id number] GYN patientfemale here for a vaginal swab for vaginitis screening, STD screen.  She reports the following symptoms:  vaginal odor  for 4 days. Denies abnormal vaginal bleeding, significant pelvic pain, fever, or UTI symptoms.  OBJECTIVE:  There were no vitals taken for this visit.  Appears well, in no apparent distress  ASSESSMENT: Vaginal swab for vaginitis screening & STD screen. Pt also received Depo in right hip. She does not want to continue with birth control.   PLAN: Self-collected vaginal probe for Gonorrhea, Chlamydia, Trichomonas, Bacterial Vaginosis, Yeast sent to lab Treatment: to be determined once results are received Follow-up as needed if symptoms persist/worsen, or new symptoms develop  [name]  02/17/2021 4:39 PM Chart reviewed for nurse visit. Agree with plan of care.  Adline Potter, NP 02/17/2021 4:53 PM

## 2021-02-19 DIAGNOSIS — F411 Generalized anxiety disorder: Secondary | ICD-10-CM | POA: Diagnosis not present

## 2021-02-19 LAB — CERVICOVAGINAL ANCILLARY ONLY
Bacterial Vaginitis (gardnerella): NEGATIVE
Candida Glabrata: NEGATIVE
Candida Vaginitis: NEGATIVE
Chlamydia: NEGATIVE
Comment: NEGATIVE
Comment: NEGATIVE
Comment: NEGATIVE
Comment: NEGATIVE
Comment: NEGATIVE
Comment: NORMAL
Neisseria Gonorrhea: NEGATIVE
Trichomonas: NEGATIVE

## 2021-02-27 DIAGNOSIS — F411 Generalized anxiety disorder: Secondary | ICD-10-CM | POA: Diagnosis not present

## 2021-05-08 ENCOUNTER — Ambulatory Visit: Payer: Medicaid Other

## 2021-06-07 ENCOUNTER — Other Ambulatory Visit: Payer: Self-pay | Admitting: Adult Health

## 2021-07-09 ENCOUNTER — Other Ambulatory Visit (HOSPITAL_COMMUNITY)
Admission: RE | Admit: 2021-07-09 | Discharge: 2021-07-09 | Disposition: A | Discharge status: Home / Self Care | Payer: Medicaid Other | Source: Ambulatory Visit | Attending: Obstetrics & Gynecology | Admitting: Obstetrics & Gynecology

## 2021-07-09 ENCOUNTER — Ambulatory Visit (INDEPENDENT_AMBULATORY_CARE_PROVIDER_SITE_OTHER): Payer: Medicaid Other | Admitting: *Deleted

## 2021-07-09 DIAGNOSIS — Z113 Encounter for screening for infections with a predominantly sexual mode of transmission: Secondary | ICD-10-CM

## 2021-07-09 DIAGNOSIS — N926 Irregular menstruation, unspecified: Secondary | ICD-10-CM | POA: Diagnosis not present

## 2021-07-09 DIAGNOSIS — R350 Frequency of micturition: Secondary | ICD-10-CM

## 2021-07-09 DIAGNOSIS — Z3202 Encounter for pregnancy test, result negative: Secondary | ICD-10-CM | POA: Diagnosis not present

## 2021-07-09 LAB — POCT URINE PREGNANCY: Preg Test, Ur: NEGATIVE

## 2021-07-09 LAB — POCT URINALYSIS DIPSTICK OB
Blood, UA: NEGATIVE
Glucose, UA: NEGATIVE
Leukocytes, UA: NEGATIVE
Nitrite, UA: NEGATIVE
POC,PROTEIN,UA: NEGATIVE

## 2021-07-09 NOTE — Progress Notes (Signed)
? ?  NURSE VISIT- VAGINITIS/STD/POC ? ?SUBJECTIVE:  ?[name] is a 36 y.o. [id number] GYN patientfemale here for a vaginal swab for vaginitis screening, STD screen.  She reports the following symptoms: urinary symptoms of urinary frequency for 2 weeks. ?Denies abnormal vaginal bleeding, significant pelvic pain, fever, or UTI symptoms. ? ?OBJECTIVE:  ?There were no vitals taken for this visit.  ?Appears well, in no apparent distress ? ?ASSESSMENT: ?Vaginal swab for STD screen ? ?PLAN: ?Self-collected vaginal probe for Gonorrhea, Chlamydia, Trichomonas, Bacterial Vaginosis, Yeast sent to lab ?Treatment: to be determined once results are received ?Follow-up as needed if symptoms persist/worsen, or new symptoms develop ? ?Also did upt that is negative due to late period. Advised if period doesn't start soon to take a home test in another week.  ? ?Also did urine dip for uti, only ketones present. Sent for culture.  ? ?[name]  ?07/09/2021 ?9:45 AM  ? ? ? ? ?

## 2021-07-10 LAB — CERVICOVAGINAL ANCILLARY ONLY
Bacterial Vaginitis (gardnerella): NEGATIVE
Candida Glabrata: NEGATIVE
Candida Vaginitis: POSITIVE — AB
Chlamydia: POSITIVE — AB
Comment: NEGATIVE
Comment: NEGATIVE
Comment: NEGATIVE
Comment: NEGATIVE
Comment: NEGATIVE
Comment: NORMAL
Neisseria Gonorrhea: NEGATIVE
Trichomonas: POSITIVE — AB

## 2021-07-10 LAB — URINALYSIS
Bilirubin, UA: NEGATIVE
Glucose, UA: NEGATIVE
Nitrite, UA: NEGATIVE
Protein,UA: NEGATIVE
RBC, UA: NEGATIVE
Specific Gravity, UA: 1.024 (ref 1.005–1.030)
Urobilinogen, Ur: 0.2 mg/dL (ref 0.2–1.0)
pH, UA: 5.5 (ref 5.0–7.5)

## 2021-07-12 LAB — URINE CULTURE

## 2021-07-13 ENCOUNTER — Encounter: Payer: Self-pay | Admitting: Adult Health

## 2021-07-13 ENCOUNTER — Telehealth: Payer: Self-pay | Admitting: Adult Health

## 2021-07-13 DIAGNOSIS — A599 Trichomoniasis, unspecified: Secondary | ICD-10-CM

## 2021-07-13 DIAGNOSIS — A749 Chlamydial infection, unspecified: Secondary | ICD-10-CM

## 2021-07-13 HISTORY — DX: Chlamydial infection, unspecified: A74.9

## 2021-07-13 HISTORY — DX: Trichomoniasis, unspecified: A59.9

## 2021-07-13 MED ORDER — FLUCONAZOLE 150 MG PO TABS: ORAL_TABLET | ORAL | 1 refills | Status: DC

## 2021-07-13 MED ORDER — METRONIDAZOLE 500 MG PO TABS: 500.0000 mg | ORAL_TABLET | Freq: Two times a day (BID) | ORAL | 0 refills | Status: DC

## 2021-07-13 MED ORDER — DOXYCYCLINE HYCLATE 100 MG PO TABS: 100.0000 mg | ORAL_TABLET | Freq: Two times a day (BID) | ORAL | 0 refills | Status: DC

## 2021-07-13 NOTE — Telephone Encounter (Signed)
Vaginal swab +chlamydia,yeast and trich, rx sent in for doxycycline 100 mg 1 bid x 7 days and flagyl 500 mg 1 bid x 7 days and diflucan. No sex or alcohol while taking, and partner needs to be treated for trich and chlamydia. He can see his doctor, or the health dept. Or I can treat will need,name,dob,any allergies and drug store. [facility] sent  ?

## 2021-07-14 ENCOUNTER — Telehealth: Payer: Self-pay | Admitting: *Deleted

## 2021-07-14 NOTE — Telephone Encounter (Signed)
Patients partner information for treatment.  ? ? ? ?[name] 08-22-1997 [facility] [address] [place].  ?

## 2021-07-14 NOTE — Telephone Encounter (Signed)
Rx for flagyl 500 mg #4 4 po now, no alcohol and doxycycline 100 mg 1 bid x 7 days to [employer] [facility] for [employer] [birth year].  ?

## 2021-07-21 ENCOUNTER — Telehealth: Payer: Self-pay | Admitting: Adult Health

## 2021-07-21 NOTE — Telephone Encounter (Signed)
Patient would like a nurse to call her. °

## 2021-07-23 NOTE — Telephone Encounter (Signed)
Returned patient's call.  Wants all blood work for STD testing as well.  Advised to make appointment for p/p since she was due and blood work could be done at that visit. Appt made for POC and pap/physical.

## 2021-08-04 ENCOUNTER — Other Ambulatory Visit (INDEPENDENT_AMBULATORY_CARE_PROVIDER_SITE_OTHER): Payer: Medicaid Other

## 2021-08-04 ENCOUNTER — Other Ambulatory Visit (HOSPITAL_COMMUNITY)
Admission: RE | Admit: 2021-08-04 | Discharge: 2021-08-04 | Disposition: A | Discharge status: Home / Self Care | Payer: Medicaid Other | Source: Ambulatory Visit | Attending: Obstetrics & Gynecology | Admitting: Obstetrics & Gynecology

## 2021-08-04 VITALS — BP 132/77 | HR 86 | Wt 199.0 lb

## 2021-08-04 DIAGNOSIS — Z113 Encounter for screening for infections with a predominantly sexual mode of transmission: Secondary | ICD-10-CM | POA: Diagnosis not present

## 2021-08-04 NOTE — Progress Notes (Signed)
Patient here for vaginal swab. I offered patient to do a "self-swab" and she accepted. Instructions were given and patient verbalized understanding.  Specimen was collected, no further questions or concerns    Pattie Flaharty, CMA

## 2021-08-06 LAB — CERVICOVAGINAL ANCILLARY ONLY
Chlamydia: NEGATIVE
Comment: NEGATIVE
Comment: NEGATIVE
Comment: NORMAL
Neisseria Gonorrhea: NEGATIVE
Trichomonas: NEGATIVE

## 2021-08-16 ENCOUNTER — Other Ambulatory Visit: Payer: Self-pay | Admitting: Adult Health

## 2021-08-26 ENCOUNTER — Other Ambulatory Visit (HOSPITAL_COMMUNITY)
Admission: RE | Admit: 2021-08-26 | Discharge: 2021-08-26 | Disposition: A | Discharge status: Home / Self Care | Payer: Medicaid Other | Source: Ambulatory Visit | Attending: Obstetrics & Gynecology | Admitting: Obstetrics & Gynecology

## 2021-08-26 ENCOUNTER — Ambulatory Visit (INDEPENDENT_AMBULATORY_CARE_PROVIDER_SITE_OTHER): Payer: Medicaid Other | Admitting: Obstetrics & Gynecology

## 2021-08-26 ENCOUNTER — Encounter: Payer: Self-pay | Admitting: Obstetrics & Gynecology

## 2021-08-26 VITALS — BP 122/77 | HR 69 | Ht 64.0 in | Wt 201.4 lb

## 2021-08-26 DIAGNOSIS — Z3202 Encounter for pregnancy test, result negative: Secondary | ICD-10-CM | POA: Diagnosis not present

## 2021-08-26 DIAGNOSIS — Z0000 Encounter for general adult medical examination without abnormal findings: Secondary | ICD-10-CM

## 2021-08-26 DIAGNOSIS — Z01419 Encounter for gynecological examination (general) (routine) without abnormal findings: Secondary | ICD-10-CM | POA: Diagnosis not present

## 2021-08-26 DIAGNOSIS — Z113 Encounter for screening for infections with a predominantly sexual mode of transmission: Secondary | ICD-10-CM

## 2021-08-26 DIAGNOSIS — Z30011 Encounter for initial prescription of contraceptive pills: Secondary | ICD-10-CM

## 2021-08-26 LAB — POCT URINE PREGNANCY: Preg Test, Ur: NEGATIVE

## 2021-08-26 MED ORDER — NORETHIN ACE-ETH ESTRAD-FE 1-20 MG-MCG(24) PO TABS: 1.0000 | ORAL_TABLET | Freq: Every day | ORAL | 4 refills | Status: DC

## 2021-08-26 NOTE — Progress Notes (Signed)
WELL-WOMAN EXAMINATION Patient name: [name] MRN [id number]  Date of birth: Sep 07, 1985 Chief Complaint:   Gynecologic Exam (Pap smear/ last pap 01-08-2019 normal. Want blood done)  History of Present Illness:   [name] is a 36 y.o. [id number] female being seen today for a routine well-woman exam.   Today she notes no acute complaints or concerns.   Patient's last menstrual period was 08/09/2021.  The current method of family planning is condoms.    Last pap 12/2018.  Last mammogram: n/a. Last colonoscopy: n/a     08/26/2021    2:54 PM 01/08/2019    4:03 PM 12/30/2017    9:50 AM 09/21/2016   11:44 AM  Depression screen PHQ 2/9  Decreased Interest 0 0 0 0  Down, Depressed, Hopeless 0 0 0 0  PHQ - 2 Score 0 0 0 0  Altered sleeping 0   0  Tired, decreased energy 0   0  Change in appetite 0   0  Feeling bad or failure about yourself  0   0  Trouble concentrating 0   0  Moving slowly or fidgety/restless 0   0  Suicidal thoughts 0   0  PHQ-9 Score 0   0      Review of Systems:   Pertinent items are noted in HPI Denies any headaches, blurred vision, fatigue, shortness of breath, chest pain, abdominal pain, bowel movements, urination, or intercourse unless otherwise stated above.  Pertinent History Reviewed:  Reviewed past medical,surgical, social and family history.  Reviewed problem list, medications and allergies. Physical Assessment:   Vitals:   08/26/21 1441  BP: 122/77  Pulse: 69  Weight: 201 lb 6.4 oz (91.4 kg)  Height: 5\' 4"  (1.626 m)  Body mass index is 34.57 kg/m.        Physical Examination:   General appearance - well appearing, and in no distress  Mental status - alert, oriented to person, place, and time  Psych:  She has a normal mood and affect  Skin - warm and dry, normal color, no suspicious lesions noted  Chest - effort normal, all lung fields clear to auscultation bilaterally  Heart - normal rate and regular rhythm  Neck:   midline trachea, no thyromegaly or nodules  Breasts - breasts appear normal, no suspicious masses, no skin or nipple changes or  axillary nodes  Abdomen - soft, nontender, nondistended, no masses or organomegaly  Pelvic - VULVA: normal appearing vulva with no masses, tenderness or lesions  VAGINA: normal appearing vagina with normal color and discharge, no lesions  CERVIX: normal appearing cervix without discharge or lesions, no CMT  Thin prep pap is done with HR HPV cotesting  UTERUS: uterus is felt to be normal size, shape, consistency and nontender   ADNEXA: No adnexal masses or tenderness noted.  Extremities:  No swelling or varicosities noted  Chaperone: N/A     Assessment & Plan:  1) Well-Woman Exam -pap collected, reviewed guidelines -desires STI screening  2) Contraceptive management -discussed options, pt desires OCPs OCP risk assessment: Pt denies personal history of VTE, stroke or heart attack.  Denies personal h/o breast cancer.  Pt is either a non-smoker or smoker under the age of 37yo.  Denies h/o migraines with aura  Orders Placed This Encounter  Procedures   RPR   HIV Antibody (routine testing w rflx)   POCT urine pregnancy    Meds:  Meds ordered this encounter  Medications   Norethindrone  Acetate-Ethinyl Estrad-FE (LOESTRIN 24 FE) 1-20 MG-MCG(24) tablet    Sig: Take 1 tablet by mouth daily.    Dispense:  90 tablet    Refill:  4     Follow-up: Return in about 3 months (around 11/26/2021) for Medication follow up (ok for televisit).   Myna Hidalgo, DO Attending Obstetrician & Gynecologist, [facility] for [employer], [facility]

## 2021-08-27 LAB — HIV ANTIBODY (ROUTINE TESTING W REFLEX): HIV Screen 4th Generation wRfx: NONREACTIVE

## 2021-08-27 LAB — RPR: RPR Ser Ql: NONREACTIVE

## 2021-09-02 LAB — CYTOLOGY - PAP
Chlamydia: NEGATIVE
Comment: NEGATIVE
Comment: NEGATIVE
Comment: NORMAL
Diagnosis: NEGATIVE
Diagnosis: REACTIVE
High risk HPV: NEGATIVE
Neisseria Gonorrhea: NEGATIVE

## 2021-11-04 ENCOUNTER — Encounter (HOSPITAL_COMMUNITY): Payer: Self-pay | Admitting: *Deleted

## 2021-11-04 ENCOUNTER — Emergency Department (HOSPITAL_COMMUNITY)
Admission: EM | Admit: 2021-11-04 | Discharge: 2021-11-04 | Disposition: A | Discharge status: Home / Self Care | Payer: Medicaid Other | Attending: Emergency Medicine | Admitting: Emergency Medicine

## 2021-11-04 ENCOUNTER — Other Ambulatory Visit: Payer: Self-pay

## 2021-11-04 DIAGNOSIS — K029 Dental caries, unspecified: Secondary | ICD-10-CM

## 2021-11-04 DIAGNOSIS — K0889 Other specified disorders of teeth and supporting structures: Secondary | ICD-10-CM | POA: Insufficient documentation

## 2021-11-04 MED ORDER — BUPIVACAINE HCL (PF) 0.5 % IJ SOLN
10.0000 mL | Freq: Once | INTRAMUSCULAR | Status: AC
  Administered 2021-11-04: 10 mL
  Filled 2021-11-04: qty 30

## 2021-11-04 MED ORDER — OXYCODONE-ACETAMINOPHEN 5-325 MG PO TABS: 1.0000 | ORAL_TABLET | Freq: Four times a day (QID) | ORAL | 0 refills | Status: DC | PRN

## 2021-11-04 NOTE — Discharge Instructions (Addendum)
Please use Tylenol or ibuprofen for pain.  You may use 600-800 mg ibuprofen every 6 hours or 1000 mg of Tylenol every 6 hours.  You may choose to alternate between the 2.  This would be most effective.  Not to exceed 4 g of Tylenol within 24 hours.  Not to exceed 3200 mg ibuprofen 24 hours.  You can use the stronger pain medication in place of Tylenol above as needed for severe breakthrough pain.  Continue to take the antibiotics that you are prescribed by the dentist.  Please follow-up with your dentist for definitive care and removal of these teeth.

## 2021-11-04 NOTE — ED Triage Notes (Signed)
Pt c/o left lower dental pain x 2-3 days.

## 2021-11-04 NOTE — ED Provider Notes (Addendum)
[facility] EMERGENCY DEPARTMENT Provider Note   CSN: [id number] Arrival date & time: 11/04/21  2703     History  Chief Complaint  Patient presents with   Dental Pain    [name] is a 36 y.o. female with noncontributory past medical history who presents with concern for left lower dental pain for the last 2 to 3 days.  Patient with known cavities on bilateral lower mouth.  Patient reports that she was at the dentist last week, prescribed amoxicillin, ibuprofen 800, and plan to extract the teeth later this month.  Patient reports that she has been trying 800 mg of ibuprofen, three 325 mg Tylenol, and has taken 2 courses of amoxicillin since initial diagnosis.  Patient reports that she was unable to sleep this morning secondary to pain.  She reports that she drove to the emergency department today.  She has not had any history of IV drug use, or been prescribed narcotics for the same problem.  Patient denies fever, chills, difficulty swallowing, foul taste in her mouth, nausea, vomiting.   Dental Pain      Home Medications Prior to Admission medications   Medication Sig Start Date End Date Taking? Authorizing Provider  BIOTIN PO Take by mouth. Patient not taking: Reported on 08/04/2021    [provider]  chlorzoxazone (PARAFON) 500 MG tablet Take 250 mg by mouth 3 (three) times daily. Patient not taking: Reported on 08/04/2021 05/09/20   [provider]  doxycycline (VIBRA-TABS) 100 MG tablet Take 1 tablet (100 mg total) by mouth 2 (two) times daily. Patient not taking: Reported on 08/26/2021 07/13/21   Cyril Mourning A, NP  DULoxetine (CYMBALTA) 30 MG capsule Take 30 mg by mouth daily. Patient not taking: Reported on 08/04/2021 04/25/20   [provider]  fluconazole (DIFLUCAN) 150 MG tablet TAKE 1 TABLET BY MOUTH NOW AND REPEAT IN 3 DAYS Patient not taking: Reported on 08/26/2021 07/13/21   Adline Potter, NP  hydrOXYzine (ATARAX/VISTARIL) 25 MG  tablet Take 25 mg by mouth 3 (three) times daily. Patient not taking: Reported on 08/04/2021 04/25/20   [provider]  metroNIDAZOLE (FLAGYL) 500 MG tablet Take 1 tablet (500 mg total) by mouth 2 (two) times daily. Patient not taking: Reported on 08/26/2021 07/13/21   Cyril Mourning A, NP  metroNIDAZOLE (METROGEL) 0.75 % vaginal gel INSERT 1 APPLICATORFUL VAGINALLY AT BEDTIME Patient not taking: Reported on 08/26/2021 06/08/21   Adline Potter, NP  Norethindrone Acetate-Ethinyl Estrad-FE (LOESTRIN 24 FE) 1-20 MG-MCG(24) tablet Take 1 tablet by mouth daily. 08/26/21 11/24/21  Myna Hidalgo, DO  VITAMIN D PO Take 1 tablet by mouth daily. Patient not taking: Reported on 08/04/2021    [provider]      Allergies    Bactrim [sulfamethoxazole-trimethoprim]    Review of Systems   Review of Systems  HENT:  Positive for dental problem.   All other systems reviewed and are negative.   Physical Exam Updated Vital Signs BP 135/78 (BP Location: Left Arm)   Pulse 71   Temp 98 F (36.7 C) (Oral)   Resp 18   Ht 5\' 4"  (1.626 m)   Wt 93 kg   LMP 10/27/2021   SpO2 100%   BMI 35.19 kg/m  Physical Exam Vitals and nursing note reviewed.  Constitutional:      General: She is not in acute distress.    Appearance: Normal appearance.  HENT:     Head: Normocephalic and atraumatic.  Mouth/Throat:      Comments: Patient with multiple broken teeth noted in lower jaw, minimal redness of gums without evidence of clear periapical abscess.  No soft tissue swelling of cheeks or intraoral mucosa.  Uvula midline, no PTA, patient can swallow without difficulty. Eyes:     General:        Right eye: No discharge.        Left eye: No discharge.  Cardiovascular:     Rate and Rhythm: Normal rate and regular rhythm.  Pulmonary:     Effort: Pulmonary effort is normal. No respiratory distress.  Musculoskeletal:        General: No deformity.  Skin:    General: Skin is warm and dry.   Neurological:     Mental Status: She is alert and oriented to person, place, and time.  Psychiatric:        Mood and Affect: Mood normal.        Behavior: Behavior normal.     ED Results / Procedures / Treatments   Labs (all labs ordered are listed, but only abnormal results are displayed) Labs Reviewed - No data to display  EKG None  Radiology No results found.  Procedures .Nerve Block  Date/Time: 11/04/2021 10:35 AM  Performed by: Olene Floss, PA-C Authorized by: Olene Floss, PA-C   Consent:    Consent obtained:  Verbal   Consent given by:  Patient   Risks, benefits, and alternatives were discussed: yes     Risks discussed:  Infection, nerve damage, pain, unsuccessful block and intravenous injection   Alternatives discussed:  No treatment Universal protocol:    Procedure explained and questions answered to patient or proxy's satisfaction: yes     Patient identity confirmed:  Verbally with patient Indications:    Indications:  Pain relief Location:    Body area:  Head   Head nerve blocked: inferior alveolar.   Laterality:  Left Pre-procedure details:    Skin preparation:  Alcohol Procedure details:    Block needle gauge:  25 G   Anesthetic injected:  Bupivacaine 0.5% w/o epi   Steroid injected:  None   Additive injected:  None   Injection procedure:  Anatomic landmarks identified, incremental injection, introduced needle, anatomic landmarks palpated and negative aspiration for blood   Paresthesia:  None Post-procedure details:    Outcome:  Pain relieved   Procedure completion:  Tolerated     Medications Ordered in ED Medications  bupivacaine(PF) (MARCAINE) 0.5 % injection 10 mL (has no administration in time range)    ED Course/ Medical Decision Making/ A&P                           Medical Decision Making Risk Prescription drug management.    This an overall well-appearing 36 y.o. female who presents with concern for dental  pain.  Physical exam reveals cavities, broken teeth in mouth bilaterally, worst on right, but primary pain on lower left today.  Patient with redness, gum swelling without evidence of gum abscess, periapical abscess, PTA, uvula deviation, pharyngitis, epiglottitis, dysphonia, stridor.  Patient without difficulty swallowing.  No systemic fever, chills.  Given patient's symptoms think it is reasonable to treat with antibiotics.  Encouraged ibuprofen, Tylenol, Orajel, ice for pain control. Considered stronger pain control with percocet, think this would be reasonable given patient taking maximum dose ibuprofen and tylenol already at this time, as well as has tried abx from dentist.  This patient tripped Emergency Department today, cannot give her any stronger pain medication at this time we will to a local nerve block.  Nerve block performed as described above. Patient tolerated with some relief of pain acutely. Discussed this is not a definitive management.  Encouraged urgent dental follow-up, dental resource guide provided.  Provided Augmentin for antibiotic coverage.  Patient discharged in stable condition at this time, return precautions given.  Final Clinical Impression(s) / ED Diagnoses Final diagnoses:  None    Rx / DC Orders ED Discharge Orders     None         Anselmo Pickler, PA-C 11/04/21 1037    Anselmo Pickler, PA-C 11/04/21 1102    Godfrey Pick, MD 11/04/21 1711

## 2021-11-27 ENCOUNTER — Ambulatory Visit: Payer: Medicaid Other

## 2021-11-27 ENCOUNTER — Ambulatory Visit (INDEPENDENT_AMBULATORY_CARE_PROVIDER_SITE_OTHER): Payer: Medicaid Other | Admitting: *Deleted

## 2021-11-27 ENCOUNTER — Ambulatory Visit: Payer: Medicaid Other | Admitting: Obstetrics & Gynecology

## 2021-11-27 ENCOUNTER — Other Ambulatory Visit (HOSPITAL_COMMUNITY)
Admission: RE | Admit: 2021-11-27 | Discharge: 2021-11-27 | Disposition: A | Discharge status: Home / Self Care | Payer: Medicaid Other | Source: Ambulatory Visit | Attending: Obstetrics & Gynecology | Admitting: Obstetrics & Gynecology

## 2021-11-27 DIAGNOSIS — N898 Other specified noninflammatory disorders of vagina: Secondary | ICD-10-CM | POA: Insufficient documentation

## 2021-11-27 NOTE — Progress Notes (Signed)
   NURSE VISIT- VAGINITIS/STD/POC  SUBJECTIVE:  [name] is a 36 y.o. [id number] GYN patientfemale here for a vaginal swab for vaginitis screening.  She reports the following symptoms:  irritation and itching  for several days. Denies abnormal vaginal bleeding, significant pelvic pain, fever, or UTI symptoms.  OBJECTIVE:  LMP 10/27/2021   Appears well, in no apparent distress  ASSESSMENT: Vaginal swab for vaginitis screening  PLAN: Self-collected vaginal probe for Gonorrhea, Chlamydia, Trichomonas, Bacterial Vaginosis, Yeast sent to lab Treatment: to be determined once results are received Follow-up as needed if symptoms persist/worsen, or new symptoms develop  Janece Canterbury  11/27/2021 1:22 PM

## 2021-11-30 LAB — CERVICOVAGINAL ANCILLARY ONLY
Bacterial Vaginitis (gardnerella): NEGATIVE
Candida Glabrata: NEGATIVE
Candida Vaginitis: NEGATIVE
Chlamydia: NEGATIVE
Comment: NEGATIVE
Comment: NEGATIVE
Comment: NEGATIVE
Comment: NEGATIVE
Comment: NEGATIVE
Comment: NORMAL
Neisseria Gonorrhea: NEGATIVE
Trichomonas: NEGATIVE

## 2022-01-22 DIAGNOSIS — M5416 Radiculopathy, lumbar region: Secondary | ICD-10-CM | POA: Diagnosis not present

## 2022-01-22 DIAGNOSIS — R350 Frequency of micturition: Secondary | ICD-10-CM | POA: Diagnosis not present

## 2022-01-22 DIAGNOSIS — R102 Pelvic and perineal pain: Secondary | ICD-10-CM | POA: Diagnosis not present

## 2022-01-25 ENCOUNTER — Other Ambulatory Visit (HOSPITAL_COMMUNITY)
Admission: RE | Admit: 2022-01-25 | Discharge: 2022-01-25 | Disposition: A | Discharge status: Home / Self Care | Payer: Medicaid Other | Source: Ambulatory Visit | Attending: Obstetrics & Gynecology | Admitting: Obstetrics & Gynecology

## 2022-01-25 ENCOUNTER — Other Ambulatory Visit (INDEPENDENT_AMBULATORY_CARE_PROVIDER_SITE_OTHER): Payer: Medicaid Other

## 2022-01-25 DIAGNOSIS — N898 Other specified noninflammatory disorders of vagina: Secondary | ICD-10-CM | POA: Diagnosis not present

## 2022-01-25 NOTE — Progress Notes (Signed)
   NURSE VISIT- VAGINITIS/STD/POC  SUBJECTIVE:  [name] is a 36 y.o. [id number] GYN patientfemale here for a vaginal swab for vaginitis screening.  She reports the following symptoms: odor and lower abdominal pain  for 1 week. She was seen at Urgent Care on Friday for lower back pain and is being treated for a UTI as well. Denies abnormal vaginal bleeding, significant pelvic pain, fever, or UTI symptoms.  OBJECTIVE:  There were no vitals taken for this visit.  Appears well, in no apparent distress  ASSESSMENT: Vaginal swab for vaginitis screening  PLAN: Self-collected vaginal probe for Gonorrhea, Chlamydia, Trichomonas, Bacterial Vaginosis, Yeast sent to lab Treatment: to be determined once results are received Follow-up as needed if symptoms persist/worsen, or new symptoms develop  Jobe Marker  01/25/2022 3:42 PM

## 2022-01-27 ENCOUNTER — Encounter: Payer: Self-pay | Admitting: Obstetrics & Gynecology

## 2022-01-27 LAB — CERVICOVAGINAL ANCILLARY ONLY
Bacterial Vaginitis (gardnerella): POSITIVE — AB
Candida Glabrata: NEGATIVE
Candida Vaginitis: NEGATIVE
Chlamydia: NEGATIVE
Comment: NEGATIVE
Comment: NEGATIVE
Comment: NEGATIVE
Comment: NEGATIVE
Comment: NEGATIVE
Comment: NORMAL
Neisseria Gonorrhea: NEGATIVE
Trichomonas: NEGATIVE

## 2022-01-28 ENCOUNTER — Other Ambulatory Visit: Payer: Self-pay | Admitting: Adult Health

## 2022-01-28 ENCOUNTER — Telehealth: Payer: Self-pay | Admitting: Adult Health

## 2022-01-28 MED ORDER — METRONIDAZOLE 500 MG PO TABS: 500.0000 mg | ORAL_TABLET | Freq: Two times a day (BID) | ORAL | 0 refills | Status: DC

## 2022-01-28 NOTE — Telephone Encounter (Signed)
Patient just called and states that she wanted someone to call her about her test results and if someone is going to send anything any.

## 2022-01-28 NOTE — Telephone Encounter (Signed)
Pt aware +BV on swab. Med was sent to pharmacy. No sex or alcohol while taking med. Pt states the pills usually don't clear it up but she will take the pills and if she still has symptoms after finishing, she will let us know. JSY

## 2022-02-03 ENCOUNTER — Other Ambulatory Visit: Payer: Self-pay

## 2022-02-03 ENCOUNTER — Encounter (HOSPITAL_COMMUNITY): Payer: Self-pay

## 2022-02-03 ENCOUNTER — Emergency Department (HOSPITAL_COMMUNITY)
Admission: EM | Admit: 2022-02-03 | Discharge: 2022-02-03 | Disposition: A | Discharge status: Home / Self Care | Payer: Medicaid Other | Attending: Emergency Medicine | Admitting: Emergency Medicine

## 2022-02-03 ENCOUNTER — Emergency Department (HOSPITAL_COMMUNITY): Payer: Medicaid Other

## 2022-02-03 DIAGNOSIS — R109 Unspecified abdominal pain: Secondary | ICD-10-CM | POA: Diagnosis present

## 2022-02-03 DIAGNOSIS — B9689 Other specified bacterial agents as the cause of diseases classified elsewhere: Secondary | ICD-10-CM | POA: Insufficient documentation

## 2022-02-03 DIAGNOSIS — R1030 Lower abdominal pain, unspecified: Secondary | ICD-10-CM | POA: Diagnosis not present

## 2022-02-03 DIAGNOSIS — N3000 Acute cystitis without hematuria: Secondary | ICD-10-CM | POA: Diagnosis not present

## 2022-02-03 DIAGNOSIS — N390 Urinary tract infection, site not specified: Secondary | ICD-10-CM | POA: Insufficient documentation

## 2022-02-03 DIAGNOSIS — R188 Other ascites: Secondary | ICD-10-CM | POA: Diagnosis not present

## 2022-02-03 LAB — CBC WITH DIFFERENTIAL/PLATELET
Abs Immature Granulocytes: 0.04 10*3/uL (ref 0.00–0.07)
Basophils Absolute: 0.1 10*3/uL (ref 0.0–0.1)
Basophils Relative: 1 %
Eosinophils Absolute: 0.3 10*3/uL (ref 0.0–0.5)
Eosinophils Relative: 3 %
HCT: 38.9 % (ref 36.0–46.0)
Hemoglobin: 12.6 g/dL (ref 12.0–15.0)
Immature Granulocytes: 0 %
Lymphocytes Relative: 33 %
Lymphs Abs: 3.1 10*3/uL (ref 0.7–4.0)
MCH: 29.9 pg (ref 26.0–34.0)
MCHC: 32.4 g/dL (ref 30.0–36.0)
MCV: 92.2 fL (ref 80.0–100.0)
Monocytes Absolute: 0.8 10*3/uL (ref 0.1–1.0)
Monocytes Relative: 8 %
Neutro Abs: 5.2 10*3/uL (ref 1.7–7.7)
Neutrophils Relative %: 55 %
Platelets: 288 10*3/uL (ref 150–400)
RBC: 4.22 MIL/uL (ref 3.87–5.11)
RDW: 13.1 % (ref 11.5–15.5)
WBC: 9.4 10*3/uL (ref 4.0–10.5)
nRBC: 0 % (ref 0.0–0.2)

## 2022-02-03 LAB — COMPREHENSIVE METABOLIC PANEL
ALT: 16 U/L (ref 0–44)
AST: 14 U/L — ABNORMAL LOW (ref 15–41)
Albumin: 3.6 g/dL (ref 3.5–5.0)
Alkaline Phosphatase: 64 U/L (ref 38–126)
Anion gap: 5 (ref 5–15)
BUN: 15 mg/dL (ref 6–20)
CO2: 25 mmol/L (ref 22–32)
Calcium: 8.6 mg/dL — ABNORMAL LOW (ref 8.9–10.3)
Chloride: 107 mmol/L (ref 98–111)
Creatinine, Ser: 0.82 mg/dL (ref 0.44–1.00)
GFR, Estimated: >60 mL/min (ref >60)
Glucose, Bld: 98 mg/dL (ref 70–99)
Potassium: 3.8 mmol/L (ref 3.5–5.1)
Sodium: 137 mmol/L (ref 135–145)
Total Bilirubin: 0.8 mg/dL (ref 0.3–1.2)
Total Protein: 6.9 g/dL (ref 6.5–8.1)

## 2022-02-03 LAB — URINALYSIS, ROUTINE W REFLEX MICROSCOPIC
Bilirubin Urine: NEGATIVE
Glucose, UA: NEGATIVE mg/dL
Hgb urine dipstick: NEGATIVE
Ketones, ur: 5 mg/dL — AB
Nitrite: NEGATIVE
Protein, ur: NEGATIVE mg/dL
Specific Gravity, Urine: 1.027 (ref 1.005–1.030)
pH: 5 (ref 5.0–8.0)

## 2022-02-03 LAB — LIPASE, BLOOD: Lipase: 34 U/L (ref 11–51)

## 2022-02-03 LAB — PREGNANCY, URINE: Preg Test, Ur: NEGATIVE

## 2022-02-03 MED ORDER — KETOROLAC TROMETHAMINE 30 MG/ML IJ SOLN
30.0000 mg | Freq: Once | INTRAMUSCULAR | Status: AC
  Administered 2022-02-03: 30 mg via INTRAVENOUS
  Filled 2022-02-03: qty 1

## 2022-02-03 MED ORDER — IOHEXOL 300 MG/ML  SOLN
100.0000 mL | Freq: Once | INTRAMUSCULAR | Status: AC | PRN
  Administered 2022-02-03: 100 mL via INTRAVENOUS

## 2022-02-03 MED ORDER — CEPHALEXIN 500 MG PO CAPS: 500.0000 mg | ORAL_CAPSULE | Freq: Three times a day (TID) | ORAL | 0 refills | Status: DC

## 2022-02-03 MED ORDER — ONDANSETRON HCL 4 MG/2ML IJ SOLN
4.0000 mg | Freq: Once | INTRAMUSCULAR | Status: AC
  Administered 2022-02-03: 4 mg via INTRAVENOUS
  Filled 2022-02-03: qty 2

## 2022-02-03 MED ORDER — TRAMADOL HCL 50 MG PO TABS: 50.0000 mg | ORAL_TABLET | Freq: Four times a day (QID) | ORAL | 0 refills | Status: DC | PRN

## 2022-02-03 MED ORDER — SODIUM CHLORIDE 0.9 % IV BOLUS
1000.0000 mL | Freq: Once | INTRAVENOUS | Status: AC
  Administered 2022-02-03: 1000 mL via INTRAVENOUS

## 2022-02-03 NOTE — ED Notes (Signed)
Patient transported to CT 

## 2022-02-03 NOTE — ED Provider Notes (Signed)
[facility] EMERGENCY DEPARTMENT Provider Note   CSN: [id number] Arrival date & time: 02/03/22  6712     History  Chief Complaint  Patient presents with   Abdominal Pain    [name] is a 36 y.o. female.  Patient is a 36 year old female with past medical history of prior cholecystectomy presenting with complaints of abdominal pain.  She describes a 3-day history of constant pain just below her umbilicus.  This pain has been constant and is worsening.  She denies having any fevers, chills, urinary complaints.  She denies any vaginal bleeding or discharge.  She denies new sexual partners.  Pain is worse with palpation and movement with no alleviating factors.  She also reports recently being treated for a urinary tract infection.  The history is provided by the patient.       Home Medications Prior to Admission medications   Medication Sig Start Date End Date Taking? Authorizing Provider  metroNIDAZOLE (FLAGYL) 500 MG tablet Take 1 tablet (500 mg total) by mouth 2 (two) times daily. 01/28/22   Adline Potter, NP  amoxicillin (AMOXIL) 500 MG tablet Take 1 tablet by mouth daily. 11/03/21   [provider]  ibuprofen (ADVIL) 800 MG tablet Take 1 tablet by mouth daily as needed for mild pain. 11/03/21   [provider]  oxyCODONE-acetaminophen (PERCOCET/ROXICET) 5-325 MG tablet Take 1 tablet by mouth every 6 (six) hours as needed for severe pain. 11/04/21   Prosperi, Christian H, PA-C      Allergies    Bactrim [sulfamethoxazole-trimethoprim]    Review of Systems   Review of Systems  All other systems reviewed and are negative.   Physical Exam Updated Vital Signs BP 124/65   Pulse 75   Temp 98.3 F (36.8 C) (Oral)   Resp 16   Ht 5\' 4"  (1.626 m)   Wt 93 kg   SpO2 100%   BMI 35.19 kg/m  Physical Exam Vitals and nursing note reviewed.  Constitutional:      General: She is not in acute distress.    Appearance: She is well-developed. She is not  diaphoretic.  HENT:     Head: Normocephalic and atraumatic.  Cardiovascular:     Rate and Rhythm: Normal rate and regular rhythm.     Heart sounds: No murmur heard.    No friction rub. No gallop.  Pulmonary:     Effort: Pulmonary effort is normal. No respiratory distress.     Breath sounds: Normal breath sounds. No wheezing.  Abdominal:     General: Bowel sounds are normal. There is no distension.     Palpations: Abdomen is soft.     Tenderness: There is abdominal tenderness in the periumbilical area and suprapubic area. There is no right CVA tenderness, left CVA tenderness, guarding or rebound.  Musculoskeletal:        General: Normal range of motion.     Cervical back: Normal range of motion and neck supple.  Skin:    General: Skin is warm and dry.  Neurological:     General: No focal deficit present.     Mental Status: She is alert and oriented to person, place, and time.     ED Results / Procedures / Treatments   Labs (all labs ordered are listed, but only abnormal results are displayed) Labs Reviewed  URINALYSIS, ROUTINE W REFLEX MICROSCOPIC  PREGNANCY, URINE  COMPREHENSIVE METABOLIC PANEL  CBC WITH DIFFERENTIAL/PLATELET  LIPASE, BLOOD    EKG  None  Radiology No results found.  Procedures Procedures  {Document cardiac monitor, telemetry assessment procedure when appropriate:1}  Medications Ordered in ED Medications  sodium chloride 0.9 % bolus 1,000 mL (has no administration in time range)  ondansetron (ZOFRAN) injection 4 mg (has no administration in time range)  ketorolac (TORADOL) 30 MG/ML injection 30 mg (has no administration in time range)    ED Course/ Medical Decision Making/ A&P                           Medical Decision Making Amount and/or Complexity of Data Reviewed Labs: ordered. Radiology: ordered.  Risk Prescription drug management.   ***  {Document critical care time when appropriate:1} {Document review of labs and clinical  decision tools ie heart score, Chads2Vasc2 etc:1}  {Document your independent review of radiology images, and any outside records:1} {Document your discussion with family members, caretakers, and with consultants:1} {Document social determinants of health affecting pt's care:1} {Document your decision making why or why not admission, treatments were needed:1} Final Clinical Impression(s) / ED Diagnoses Final diagnoses:  None    Rx / DC Orders ED Discharge Orders     None

## 2022-02-03 NOTE — ED Triage Notes (Signed)
Pt c/o back and abdominal pain that is generalized since yesterday. Pt states that she had UTI last week and was given antibiotics. Denies urinary symptoms or N/V.

## 2022-02-03 NOTE — Discharge Instructions (Signed)
Begin taking Keflex as prescribed.  Take tramadol as prescribed as needed for pain.

## 2022-02-03 NOTE — ED Notes (Signed)
ED Provider at bedside. 

## 2022-02-09 ENCOUNTER — Other Ambulatory Visit (HOSPITAL_COMMUNITY)
Admission: RE | Admit: 2022-02-09 | Discharge: 2022-02-09 | Disposition: A | Discharge status: Home / Self Care | Payer: Medicaid Other | Source: Ambulatory Visit | Attending: Adult Health | Admitting: Adult Health

## 2022-02-09 ENCOUNTER — Ambulatory Visit: Payer: Medicaid Other | Admitting: Adult Health

## 2022-02-09 ENCOUNTER — Encounter: Payer: Self-pay | Admitting: Adult Health

## 2022-02-09 VITALS — BP 120/72 | HR 61 | Ht 66.0 in | Wt 207.0 lb

## 2022-02-09 DIAGNOSIS — B9689 Other specified bacterial agents as the cause of diseases classified elsewhere: Secondary | ICD-10-CM

## 2022-02-09 DIAGNOSIS — R102 Pelvic and perineal pain: Secondary | ICD-10-CM

## 2022-02-09 DIAGNOSIS — Z30011 Encounter for initial prescription of contraceptive pills: Secondary | ICD-10-CM | POA: Diagnosis not present

## 2022-02-09 DIAGNOSIS — N760 Acute vaginitis: Secondary | ICD-10-CM | POA: Diagnosis not present

## 2022-02-09 MED ORDER — LO LOESTRIN FE 1 MG-10 MCG / 10 MCG PO TABS: 1.0000 | ORAL_TABLET | Freq: Every day | ORAL | 11 refills | Status: DC

## 2022-02-09 NOTE — Progress Notes (Signed)
  Subjective:     Patient ID: [name], female   DOB: 1985-08-05, 36 y.o.   MRN: [id number]  HPI [name] is a 36 year old black female,single, G5P5005 in for follow up on being seen in ER 16/23 for low abdominal pain and had CT that was normal and labs.  Last pap was negative HPV, NILM 08/26/21.   Review of Systems No pain now Periods heavy at times History of BV Reviewed past medical,surgical, social and family history. Reviewed medications and allergies.     Objective:   Physical Exam BP 120/72 (BP Location: Left Arm, Patient Position: Sitting, Cuff Size: Normal)   Pulse 61   Ht 5\' 6"  (1.676 m)   Wt 207 lb (93.9 kg)   LMP 02/08/2022   BMI 33.41 kg/m  Skin warm and dry.Pelvic: external genitalia is normal in appearance no lesions, vagina: +period blood, no odor,urethra has no lesions or masses noted, cervix:smooth and bulbous, uterus: normal size, shape and contour, non tender, no masses felt, adnexa: no masses or tenderness noted. Bladder is non tender and no masses felt.    Fall risk is low  Upstream - 02/08/22 1851       Contraception Wrap Up   End Method Oral Contraceptive    Contraception Counseling Provided Yes    How was the end contraceptive method provided? Prescription            Examination chaperoned by 14/11/23 RN  Assessment:     1. Pelvic pain, resolved.  2. Encounter for initial prescription of contraceptive pills Denies any MI,stroke, DVT,breast cancer or migraine with aura Will rx lo Loestrin and gave 1 pack to start today, and use condoms for 1 pack Meds ordered this encounter  Medications   Norethindrone-Ethinyl Estradiol-Fe Biphas (LO LOESTRIN FE) 1 MG-10 MCG / 10 MCG tablet    Sig: Take 1 tablet by mouth daily. Take 1 daily by mouth    Dispense:  28 tablet    Refill:  11    BIN Dorthula Perfect, PCN CN, GRP [id number]    Order Specific Question:   Supervising Provider    Answer:   [id number] [[id number]]     3. Bacterial  vaginosis, has history of BV CV swab sent for BV    Plan:     Follow up in 3 months for ROS and BP check

## 2022-02-10 LAB — CERVICOVAGINAL ANCILLARY ONLY
Bacterial Vaginitis (gardnerella): NEGATIVE
Comment: NEGATIVE

## 2022-03-25 ENCOUNTER — Other Ambulatory Visit (HOSPITAL_COMMUNITY)
Admission: RE | Admit: 2022-03-25 | Discharge: 2022-03-25 | Disposition: A | Discharge status: Home / Self Care | Payer: Medicaid Other | Source: Ambulatory Visit | Attending: Obstetrics & Gynecology | Admitting: Obstetrics & Gynecology

## 2022-03-25 ENCOUNTER — Other Ambulatory Visit (INDEPENDENT_AMBULATORY_CARE_PROVIDER_SITE_OTHER): Payer: Medicaid Other | Admitting: *Deleted

## 2022-03-25 DIAGNOSIS — Z113 Encounter for screening for infections with a predominantly sexual mode of transmission: Secondary | ICD-10-CM | POA: Insufficient documentation

## 2022-03-25 NOTE — Progress Notes (Signed)
   NURSE VISIT- VAGINITIS/STD  SUBJECTIVE:  [name] is a 37 y.o. [id number] GYN patientfemale here for a vaginal swab for vaginitis screening, STD screen.  She reports the following symptoms: none.      Denies abnormal vaginal bleeding, significant pelvic pain, fever, or UTI symptoms.  OBJECTIVE:  There were no vitals taken for this visit.  Appears well, in no apparent distress  ASSESSMENT: Vaginal swab for vaginitis screening & STD screening.  PLAN: Self-collected vaginal probe for Gonorrhea, Chlamydia, Trichomonas, Bacterial Vaginosis, Yeast sent to lab Treatment: to be determined once results are received Follow-up as needed if symptoms persist/worsen, or new symptoms develop  Levy Pupa  03/25/2022 11:25 AM

## 2022-03-26 LAB — CERVICOVAGINAL ANCILLARY ONLY
Bacterial Vaginitis (gardnerella): POSITIVE — AB
Candida Glabrata: NEGATIVE
Candida Vaginitis: POSITIVE — AB
Chlamydia: NEGATIVE
Comment: NEGATIVE
Comment: NEGATIVE
Comment: NEGATIVE
Comment: NEGATIVE
Comment: NEGATIVE
Comment: NORMAL
Neisseria Gonorrhea: NEGATIVE
Trichomonas: NEGATIVE

## 2022-03-27 ENCOUNTER — Other Ambulatory Visit: Payer: Self-pay | Admitting: Adult Health

## 2022-03-29 ENCOUNTER — Other Ambulatory Visit: Payer: Self-pay | Admitting: Adult Health

## 2022-03-29 MED ORDER — FLUCONAZOLE 150 MG PO TABS: ORAL_TABLET | ORAL | 1 refills | Status: DC

## 2022-03-29 MED ORDER — METRONIDAZOLE 0.75 % VA GEL: Freq: Every day | VAGINAL | 0 refills | Status: DC

## 2022-03-29 NOTE — Progress Notes (Signed)
+  BV and yeast on vaginal swab will rx metrogel and diflucan

## 2022-04-23 DIAGNOSIS — N898 Other specified noninflammatory disorders of vagina: Secondary | ICD-10-CM | POA: Diagnosis not present

## 2022-04-23 DIAGNOSIS — A599 Trichomoniasis, unspecified: Secondary | ICD-10-CM | POA: Diagnosis not present

## 2022-04-23 DIAGNOSIS — R198 Other specified symptoms and signs involving the digestive system and abdomen: Secondary | ICD-10-CM | POA: Diagnosis not present

## 2022-04-26 DIAGNOSIS — N898 Other specified noninflammatory disorders of vagina: Secondary | ICD-10-CM | POA: Diagnosis not present

## 2022-04-26 DIAGNOSIS — R198 Other specified symptoms and signs involving the digestive system and abdomen: Secondary | ICD-10-CM | POA: Diagnosis not present

## 2022-04-26 DIAGNOSIS — R350 Frequency of micturition: Secondary | ICD-10-CM | POA: Diagnosis not present

## 2022-04-26 DIAGNOSIS — A599 Trichomoniasis, unspecified: Secondary | ICD-10-CM | POA: Diagnosis not present

## 2022-05-11 ENCOUNTER — Ambulatory Visit: Payer: Medicaid Other | Admitting: Adult Health

## 2022-05-11 ENCOUNTER — Encounter: Payer: Self-pay | Admitting: Adult Health

## 2022-05-11 ENCOUNTER — Other Ambulatory Visit (HOSPITAL_COMMUNITY)
Admission: RE | Admit: 2022-05-11 | Discharge: 2022-05-11 | Disposition: A | Discharge status: Home / Self Care | Payer: Medicaid Other | Source: Ambulatory Visit | Attending: Adult Health | Admitting: Adult Health

## 2022-05-11 VITALS — BP 123/70 | HR 67 | Ht 64.0 in | Wt 202.0 lb

## 2022-05-11 DIAGNOSIS — Z3202 Encounter for pregnancy test, result negative: Secondary | ICD-10-CM | POA: Diagnosis not present

## 2022-05-11 DIAGNOSIS — N898 Other specified noninflammatory disorders of vagina: Secondary | ICD-10-CM

## 2022-05-11 DIAGNOSIS — Z113 Encounter for screening for infections with a predominantly sexual mode of transmission: Secondary | ICD-10-CM | POA: Insufficient documentation

## 2022-05-11 DIAGNOSIS — Z3041 Encounter for surveillance of contraceptive pills: Secondary | ICD-10-CM | POA: Diagnosis not present

## 2022-05-11 LAB — POCT URINE PREGNANCY: Preg Test, Ur: NEGATIVE

## 2022-05-11 MED ORDER — LO LOESTRIN FE 1 MG-10 MCG / 10 MCG PO TABS: 1.0000 | ORAL_TABLET | Freq: Every day | ORAL | 3 refills | Status: DC

## 2022-05-11 NOTE — Progress Notes (Signed)
  Subjective:     Patient ID: [name], female   DOB: 09-16-1985, 37 y.o.   MRN: [id number]  HPI [name] is a 37 year old black female, single, G5P5005, in for follow up on BP and birth control and has vaginal irritation was treated for BV and yeast in January. She only took lo Loestrin for 1 pack, but wants to start back, her boyfriend did not want her on the pill, and now he is an ex.  Last pap was negative HPV, NILM 08/26/21.    Review of Systems +vaginal irritation Reviewed past medical,surgical, social and family history. Reviewed medications and allergies.     Objective:   Physical Exam BP 123/70 (BP Location: Right Arm, Patient Position: Sitting, Cuff Size: Normal)   Pulse 67   Ht 5\' 4"  (1.626 m)   Wt 202 lb (91.6 kg)   LMP 04/16/2022 (Approximate)   BMI 34.67 kg/m  UPT is negative  Skin warm and dry. Lungs: clear to ausculation bilaterally. Cardiovascular: regular rate and rhythm.    Pelvic: external genitalia is normal in appearance no lesions, vagina: scant white discharge without odor,urethra has no lesions or masses noted, cervix:smooth and bulbous, uterus: normal size, shape and contour, non tender, no masses felt, adnexa: no masses or tenderness noted. Bladder is non tender and no masses felt. CV swab obtained. Fall risk is low  Upstream - 05/11/22 0923       Pregnancy Intention Screening   Does the patient want to become pregnant in the next year? No    Does the patient's partner want to become pregnant in the next year? No    Would the patient like to discuss contraceptive options today? Yes      Contraception Wrap Up   Current Method Abstinence    End Method Oral Contraceptive    Contraception Counseling Provided Yes    How was the end contraceptive method provided? Prescription              Examination chaperoned by Levy Pupa LPN  Assessment:     1. Pregnancy examination or test, negative result - POCT urine pregnancy  2. Encounter for  surveillance of contraceptive pills Will rx Lo Loestrin again Can start with next period,due any day, and use condoms for 1 pack   3. Vaginal irritation Was treated in January for BV and yeast  CV swb sent for GC/CHL,trich,BV and yeast  - Cervicovaginal ancillary only( [place])  4. Screen for STD (sexually transmitted disease) Will check blood labs too  - Cervicovaginal ancillary only( [place]) - Hepatitis C antibody - Hepatitis B surface antigen - HIV Antibody (routine testing w rflx) - RPR     Plan:     Follow up prn

## 2022-05-12 ENCOUNTER — Telehealth: Payer: Self-pay

## 2022-05-12 LAB — HEPATITIS B SURFACE ANTIGEN: Hepatitis B Surface Ag: NEGATIVE

## 2022-05-12 LAB — HEPATITIS C ANTIBODY: Hep C Virus Ab: NONREACTIVE

## 2022-05-12 LAB — CERVICOVAGINAL ANCILLARY ONLY
Bacterial Vaginitis (gardnerella): NEGATIVE
Candida Glabrata: NEGATIVE
Candida Vaginitis: NEGATIVE
Chlamydia: NEGATIVE
Comment: NEGATIVE
Comment: NEGATIVE
Comment: NEGATIVE
Comment: NEGATIVE
Comment: NEGATIVE
Comment: NORMAL
Neisseria Gonorrhea: NEGATIVE
Trichomonas: NEGATIVE

## 2022-05-12 LAB — HIV ANTIBODY (ROUTINE TESTING W REFLEX): HIV Screen 4th Generation wRfx: NONREACTIVE

## 2022-05-12 LAB — RPR: RPR Ser Ql: NONREACTIVE

## 2022-05-12 NOTE — Telephone Encounter (Signed)
Patient would like to speak to someone about her test results.  Can not get into her Mychart.

## 2022-05-12 NOTE — Telephone Encounter (Signed)
Returned patient's call. Informed all test results negative. Also reset password to access mychart.

## 2022-06-01 ENCOUNTER — Emergency Department (HOSPITAL_COMMUNITY)
Admission: EM | Admit: 2022-06-01 | Discharge: 2022-06-01 | Disposition: A | Discharge status: Home / Self Care | Payer: Medicaid Other | Attending: Emergency Medicine | Admitting: Emergency Medicine

## 2022-06-01 ENCOUNTER — Emergency Department (HOSPITAL_COMMUNITY): Payer: Medicaid Other

## 2022-06-01 ENCOUNTER — Encounter (HOSPITAL_COMMUNITY): Payer: Self-pay

## 2022-06-01 ENCOUNTER — Other Ambulatory Visit: Payer: Self-pay

## 2022-06-01 DIAGNOSIS — R110 Nausea: Secondary | ICD-10-CM | POA: Diagnosis not present

## 2022-06-01 DIAGNOSIS — R102 Pelvic and perineal pain: Secondary | ICD-10-CM | POA: Diagnosis not present

## 2022-06-01 DIAGNOSIS — R001 Bradycardia, unspecified: Secondary | ICD-10-CM | POA: Diagnosis not present

## 2022-06-01 LAB — CBC WITH DIFFERENTIAL/PLATELET
Abs Immature Granulocytes: 0.03 10*3/uL (ref 0.00–0.07)
Basophils Absolute: 0 10*3/uL (ref 0.0–0.1)
Basophils Relative: 1 %
Eosinophils Absolute: 0.2 10*3/uL (ref 0.0–0.5)
Eosinophils Relative: 3 %
HCT: 37.7 % (ref 36.0–46.0)
Hemoglobin: 12.4 g/dL (ref 12.0–15.0)
Immature Granulocytes: 1 %
Lymphocytes Relative: 35 %
Lymphs Abs: 2.2 10*3/uL (ref 0.7–4.0)
MCH: 30.5 pg (ref 26.0–34.0)
MCHC: 32.9 g/dL (ref 30.0–36.0)
MCV: 92.6 fL (ref 80.0–100.0)
Monocytes Absolute: 0.6 10*3/uL (ref 0.1–1.0)
Monocytes Relative: 10 %
Neutro Abs: 3.1 10*3/uL (ref 1.7–7.7)
Neutrophils Relative %: 50 %
Platelets: 243 10*3/uL (ref 150–400)
RBC: 4.07 MIL/uL (ref 3.87–5.11)
RDW: 13.5 % (ref 11.5–15.5)
WBC: 6.2 10*3/uL (ref 4.0–10.5)
nRBC: 0 % (ref 0.0–0.2)

## 2022-06-01 LAB — URINALYSIS, ROUTINE W REFLEX MICROSCOPIC
Bilirubin Urine: NEGATIVE
Glucose, UA: NEGATIVE mg/dL
Hgb urine dipstick: NEGATIVE
Ketones, ur: NEGATIVE mg/dL
Leukocytes,Ua: NEGATIVE
Nitrite: NEGATIVE
Protein, ur: NEGATIVE mg/dL
Specific Gravity, Urine: 1.018 (ref 1.005–1.030)
pH: 6 (ref 5.0–8.0)

## 2022-06-01 LAB — COMPREHENSIVE METABOLIC PANEL
ALT: 13 U/L (ref 0–44)
AST: 14 U/L — ABNORMAL LOW (ref 15–41)
Albumin: 3.6 g/dL (ref 3.5–5.0)
Alkaline Phosphatase: 55 U/L (ref 38–126)
Anion gap: 8 (ref 5–15)
BUN: 9 mg/dL (ref 6–20)
CO2: 24 mmol/L (ref 22–32)
Calcium: 8.5 mg/dL — ABNORMAL LOW (ref 8.9–10.3)
Chloride: 104 mmol/L (ref 98–111)
Creatinine, Ser: 0.71 mg/dL (ref 0.44–1.00)
GFR, Estimated: >60 mL/min (ref >60)
Glucose, Bld: 100 mg/dL — ABNORMAL HIGH (ref 70–99)
Potassium: 3.6 mmol/L (ref 3.5–5.1)
Sodium: 136 mmol/L (ref 135–145)
Total Bilirubin: 1.1 mg/dL (ref 0.3–1.2)
Total Protein: 6.8 g/dL (ref 6.5–8.1)

## 2022-06-01 LAB — WET PREP, GENITAL
Sperm: NONE SEEN
Trich, Wet Prep: NONE SEEN
Yeast Wet Prep HPF POC: NONE SEEN

## 2022-06-01 LAB — LIPASE, BLOOD: Lipase: 28 U/L (ref 11–51)

## 2022-06-01 LAB — MAGNESIUM: Magnesium: 1.9 mg/dL (ref 1.7–2.4)

## 2022-06-01 LAB — POC URINE PREG, ED: Preg Test, Ur: NEGATIVE

## 2022-06-01 MED ORDER — KETOROLAC TROMETHAMINE 15 MG/ML IJ SOLN
15.0000 mg | Freq: Once | INTRAMUSCULAR | Status: AC
  Administered 2022-06-01: 15 mg via INTRAVENOUS
  Filled 2022-06-01: qty 1

## 2022-06-01 MED ORDER — FENTANYL CITRATE PF 50 MCG/ML IJ SOSY
50.0000 ug | PREFILLED_SYRINGE | Freq: Once | INTRAMUSCULAR | Status: AC
  Administered 2022-06-01: 50 ug via INTRAVENOUS
  Filled 2022-06-01: qty 1

## 2022-06-01 MED ORDER — ONDANSETRON 4 MG PO TBDP: 4.0000 mg | ORAL_TABLET | Freq: Three times a day (TID) | ORAL | 0 refills | Status: DC | PRN

## 2022-06-01 MED ORDER — METRONIDAZOLE 500 MG PO TABS: 500.0000 mg | ORAL_TABLET | Freq: Two times a day (BID) | ORAL | 0 refills | Status: DC

## 2022-06-01 MED ORDER — IOHEXOL 300 MG/ML  SOLN
100.0000 mL | Freq: Once | INTRAMUSCULAR | Status: AC | PRN
  Administered 2022-06-01: 100 mL via INTRAVENOUS

## 2022-06-01 MED ORDER — OXYCODONE-ACETAMINOPHEN 5-325 MG PO TABS
1.0000 | ORAL_TABLET | Freq: Once | ORAL | Status: AC
  Administered 2022-06-01: 1 via ORAL
  Filled 2022-06-01: qty 1

## 2022-06-01 MED ORDER — ONDANSETRON 4 MG PO TBDP
4.0000 mg | ORAL_TABLET | Freq: Once | ORAL | Status: AC
  Administered 2022-06-01: 4 mg via ORAL
  Filled 2022-06-01: qty 1

## 2022-06-01 NOTE — ED Notes (Signed)
Patient transported to CT 

## 2022-06-01 NOTE — ED Triage Notes (Signed)
Pt c/o lower abdominal pain x2 weeks and nausea x a few days.  Pain score 7/10.  Denies dysuria and vaginal discharge.

## 2022-06-01 NOTE — Discharge Instructions (Addendum)
Prescriptions were sent to your pharmacy: -It was an antibiotic to treat bacterial vaginosis.  Avoid use of alcohol while taking this medication. -Zofran is a medication to treat nausea.  Take as needed.  Take ibuprofen and Tylenol as needed for ongoing pain.  Return to the emergency department for any worsening of symptoms.

## 2022-06-01 NOTE — ED Notes (Addendum)
Patient showing a Sinus brady HR.  Pt denies CP and does not appear in any distress. EDP notified. EKG performed.

## 2022-06-01 NOTE — ED Notes (Signed)
Pt noted ambulated to restroom and back w/o difficulty.

## 2022-06-01 NOTE — ED Notes (Signed)
Pt given water at this time 

## 2022-06-01 NOTE — ED Provider Notes (Signed)
[facility] Provider Note   CSN: [id number] Arrival date & time: 06/01/22  [id number]     History  Chief Complaint  Patient presents with   Abdominal Pain   Nausea    [name] is a 37 y.o. female.   Abdominal Pain Associated symptoms: nausea   Patient presents for 2 weeks of lower abdominal pain.  Over the past several days, she has experienced nausea.  Medical history includes HSV, BV, STI.  Patient states that she had a sexual encounter several weeks ago and is worried about a condom and/or tampon being left in her vagina.  She denies any noticeable recent discharge.  Last menstrual period was 2 weeks ago.  She has been having normal bowel movements, including this morning.  Pain is typically bilaterally.  She will notice it slightly more on the left side at times.  Despite her nausea, she has not had vomiting.  She has been able to take in food and fluids normally.  She has not had any subjective fevers.     Home Medications Prior to Admission medications   Medication Sig Start Date End Date Taking? Authorizing Provider  metroNIDAZOLE (FLAGYL) 500 MG tablet Take 1 tablet (500 mg total) by mouth 2 (two) times daily. 06/01/22  Yes Godfrey Pick, MD  ondansetron (ZOFRAN-ODT) 4 MG disintegrating tablet Take 1 tablet (4 mg total) by mouth every 8 (eight) hours as needed for nausea or vomiting. 06/01/22  Yes Godfrey Pick, MD  ibuprofen (ADVIL) 800 MG tablet Take 1 tablet by mouth daily as needed for mild pain. Patient not taking: Reported on 06/01/2022 11/03/21   [provider]  metroNIDAZOLE (METROGEL) 0.75 % vaginal gel Place vaginally at bedtime. X 5 nights Patient not taking: Reported on 06/01/2022 03/29/22   Estill Dooms, NP  Norethindrone-Ethinyl Estradiol-Fe Biphas (LO LOESTRIN FE) 1 MG-10 MCG / 10 MCG tablet Take 1 tablet by mouth daily. Take 1 daily by mouth Patient not taking: Reported on 06/01/2022 05/11/22   Estill Dooms, NP  traMADol (ULTRAM) 50 MG tablet Take 1 tablet (50 mg total) by mouth every 6 (six) hours as needed. Patient not taking: Reported on 06/01/2022 02/03/22   Veryl Speak, MD      Allergies    Bactrim [sulfamethoxazole-trimethoprim]    Review of Systems   Review of Systems  Gastrointestinal:  Positive for abdominal pain and nausea.  Genitourinary:  Positive for pelvic pain.  All other systems reviewed and are negative.   Physical Exam Updated Vital Signs BP 113/68   Pulse (!) 46   Temp 97.7 F (36.5 C) (Oral)   Resp 12   Ht 5\' 4"  (1.626 m)   Wt 91.6 kg   LMP 05/18/2022 (Approximate)   SpO2 99%   BMI 34.67 kg/m  Physical Exam Vitals and nursing note reviewed. Exam conducted with a chaperone present.  Constitutional:      General: She is not in acute distress.    Appearance: She is well-developed. She is not ill-appearing, toxic-appearing or diaphoretic.  HENT:     Head: Normocephalic and atraumatic.     Mouth/Throat:     Mouth: Mucous membranes are moist.  Eyes:     Conjunctiva/sclera: Conjunctivae normal.  Cardiovascular:     Rate and Rhythm: Normal rate and regular rhythm.  Pulmonary:     Effort: Pulmonary effort is normal. No respiratory distress.  Abdominal:     Palpations: Abdomen is soft.  Tenderness: There is abdominal tenderness in the suprapubic area. There is no guarding or rebound.  Genitourinary:    General: Normal vulva.     Vagina: No foreign body. No vaginal discharge, erythema or bleeding.     Cervix: No cervical motion tenderness, discharge, friability, erythema or cervical bleeding.  Musculoskeletal:        General: No swelling.     Cervical back: Neck supple.  Skin:    General: Skin is warm and dry.     Coloration: Skin is not cyanotic, jaundiced or pale.  Neurological:     General: No focal deficit present.     Mental Status: She is alert and oriented to person, place, and time.  Psychiatric:        Mood and Affect: Mood normal.         Behavior: Behavior normal.     ED Results / Procedures / Treatments   Labs (all labs ordered are listed, but only abnormal results are displayed) Labs Reviewed  WET PREP, GENITAL - Abnormal; Notable for the following components:      Result Value   Clue Cells Wet Prep HPF POC PRESENT (*)    WBC, Wet Prep HPF POC RARE (*)    All other components within normal limits  COMPREHENSIVE METABOLIC PANEL - Abnormal; Notable for the following components:   Glucose, Bld 100 (*)    Calcium 8.5 (*)    AST 14 (*)    All other components within normal limits  URINALYSIS, ROUTINE W REFLEX MICROSCOPIC - Abnormal; Notable for the following components:   APPearance HAZY (*)    All other components within normal limits  LIPASE, BLOOD  CBC WITH DIFFERENTIAL/PLATELET  MAGNESIUM  POC URINE PREG, ED  GC/CHLAMYDIA PROBE AMP ([place]) NOT AT [facility]    EKG None  Radiology CT ABDOMEN PELVIS W CONTRAST  Result Date: 06/01/2022 CLINICAL DATA:  Lower abdominal pain EXAM: CT ABDOMEN AND PELVIS WITH CONTRAST TECHNIQUE: Multidetector CT imaging of the abdomen and pelvis was performed using the standard protocol following bolus administration of intravenous contrast. RADIATION DOSE REDUCTION: This exam was performed according to the departmental dose-optimization program which includes automated exposure control, adjustment of the mA and/or kV according to patient size and/or use of iterative reconstruction technique. CONTRAST:  143mL OMNIPAQUE IOHEXOL 300 MG/ML  SOLN COMPARISON:  None Available. FINDINGS: Lower chest: Minimal bibasilar dependent subsegmental atelectasis and small pleural effusions. Hepatobiliary: No focal liver abnormality is seen. Status post cholecystectomy. No biliary dilatation. Pancreas: Unremarkable. No pancreatic ductal dilatation or surrounding inflammatory changes. Spleen: Normal in size without focal abnormality. Adrenals/Urinary Tract: Adrenal glands are unremarkable. Kidneys are  normal, without renal calculi, focal lesion, or hydronephrosis. Bladder is unremarkable. Stomach/Bowel: Stomach is within normal limits. Appendix appears normal. No evidence of bowel wall thickening, distention, or inflammatory changes. Vascular/Lymphatic: No significant vascular findings are present. No enlarged abdominal or pelvic lymph nodes. Reproductive: Uterus and bilateral adnexa are unremarkable. Other: Small amount of fluid in the cul-de-sac. No abdominal wall defects. Musculoskeletal: No acute or significant osseous findings. IMPRESSION: 1. Dependent subsegmental atelectasis small pleural effusions. 2. Minimal physiologic amount of fluid in the cul-de-sac. This suggests the possibility of a leaking ovarian cyst as an etiology for pain. 3. No acute abdominal or pelvic pathology. Electronically Signed   By: Sammie Bench M.D.   On: 06/01/2022 11:36    Procedures Procedures    Medications Ordered in ED Medications  oxyCODONE-acetaminophen (PERCOCET/ROXICET) 5-325 MG per tablet 1 tablet (  1 tablet Oral Given 06/01/22 0832)  ondansetron (ZOFRAN-ODT) disintegrating tablet 4 mg (4 mg Oral Given 06/01/22 0832)  ketorolac (TORADOL) 15 MG/ML injection 15 mg (15 mg Intravenous Given 06/01/22 1056)  fentaNYL (SUBLIMAZE) injection 50 mcg (50 mcg Intravenous Given 06/01/22 1054)  iohexol (OMNIPAQUE) 300 MG/ML solution 100 mL (100 mLs Intravenous Contrast Given 06/01/22 1112)    ED Course/ Medical Decision Making/ A&P                             Medical Decision Making Amount and/or Complexity of Data Reviewed Labs: ordered. Radiology: ordered.  Risk Prescription drug management.   This patient presents to the ED for concern of abdominal pain, this involves an extensive number of treatment options, and is a complaint that carries with it a high risk of complications and morbidity.  The differential diagnosis includes UTI, cervicitis, PID, ovarian cyst rupture, constipation, colitis   Co morbidities  that complicate the patient evaluation  HSV, BV, STI   Additional history obtained:  Additional history obtained from N/A External records from outside source obtained and reviewed including EMR   Lab Tests:  I Ordered, and personally interpreted labs.  The pertinent results include: Normal hemoglobin, no leukocytosis, normal kidney function, normal electrolytes.  Wet prep is positive for clue cells.   Imaging Studies ordered:  I ordered imaging studies including CT of abdomen pelvis I independently visualized and interpreted imaging which showed trace amount of pelvic free fluid without any other acute findings I agree with the radiologist interpretation   Cardiac Monitoring: / EKG:  The patient was maintained on a cardiac monitor.  I personally viewed and interpreted the cardiac monitored which showed an underlying rhythm of: Sinus rhythm  Problem List / ED Course / Critical interventions / Medication management  Patient presenting for lower abdominal pain over the past 2 weeks.  She developed nausea over the past couple days.  On arrival in the ED, vital signs are reassuring.  She is well-appearing on exam.  Her abdomen is soft but she does describe mild tenderness to her lower abdomen.  She is worried about a vaginal foreign body.  She has not noticed any recent vaginal discharge.  Percocet and Zofran ordered for symptomatic relief.  On pelvic exam, there is trace amount of physiologic discharge.  There is no evidence of cervicitis.  There are no foreign bodies.  Patient underwent CT imaging of abdomen pelvis which did show trace amount of pelvic free fluid.  Likely cause of her discomfort over the past 2 weeks is cystic rupture.  Patient was given Toradol for ongoing analgesia.  Wet prep did show clue cells consistent with BV.  Patient was informed of results.  She does feel comfortable with discharge home.  Flagyl was prescribed.  She was advised to take ibuprofen as needed for  pain.  Zofran was prescribed to take as needed for nausea.  She was advised to return for any worsening of symptoms.  She was discharged in stable condition. I ordered medication including Zofran for nausea; Percocet, fentanyl and Toradol for analgesia Reevaluation of the patient after these medicines showed that the patient improved I have reviewed the patients home medicines and have made adjustments as needed   Social Determinants of Health:  Does not have a PCP        Final Clinical Impression(s) / ED Diagnoses Final diagnoses:  Pelvic pain in female    Rx /  DC Orders ED Discharge Orders          Ordered    metroNIDAZOLE (FLAGYL) 500 MG tablet  2 times daily        06/01/22 1424    ondansetron (ZOFRAN-ODT) 4 MG disintegrating tablet  Every 8 hours PRN        06/01/22 1424              Godfrey Pick, MD 06/01/22 1424

## 2022-06-01 NOTE — ED Notes (Signed)
Pelvic cart placed in patients room. Nurse aware.

## 2022-06-02 LAB — GC/CHLAMYDIA PROBE AMP (~~LOC~~) NOT AT ARMC
Chlamydia: NEGATIVE
Comment: NEGATIVE
Comment: NORMAL
Neisseria Gonorrhea: NEGATIVE

## 2022-09-06 ENCOUNTER — Ambulatory Visit: Payer: Medicaid Other | Admitting: Women's Health

## 2022-09-30 ENCOUNTER — Encounter: Payer: Self-pay | Admitting: Obstetrics and Gynecology

## 2022-09-30 ENCOUNTER — Ambulatory Visit (INDEPENDENT_AMBULATORY_CARE_PROVIDER_SITE_OTHER): Payer: Medicaid Other | Admitting: Obstetrics and Gynecology

## 2022-09-30 VITALS — BP 113/55 | HR 73 | Wt 202.0 lb

## 2022-09-30 DIAGNOSIS — Z3201 Encounter for pregnancy test, result positive: Secondary | ICD-10-CM | POA: Diagnosis not present

## 2022-09-30 DIAGNOSIS — Z01419 Encounter for gynecological examination (general) (routine) without abnormal findings: Secondary | ICD-10-CM

## 2022-09-30 DIAGNOSIS — Z789 Other specified health status: Secondary | ICD-10-CM | POA: Diagnosis not present

## 2022-09-30 LAB — POCT URINE PREGNANCY: Preg Test, Ur: POSITIVE — AB

## 2022-09-30 MED ORDER — PROMETHAZINE HCL 25 MG PO TABS
25.0000 mg | ORAL_TABLET | Freq: Four times a day (QID) | ORAL | 2 refills | Status: DC | PRN
Start: 2022-09-30 — End: 2023-05-25

## 2022-09-30 MED ORDER — PRENATAL 28-0.8 MG PO TABS
1.0000 | ORAL_TABLET | Freq: Every day | ORAL | 12 refills | Status: DC
Start: 2022-09-30 — End: 2022-10-18

## 2022-09-30 NOTE — Progress Notes (Signed)
ANNUAL EXAM Patient name: [name] MRN [id number]  Date of birth: [birth year] Chief Complaint:   Annual exam, pregnancy confirmation  History of Present Illness:   [name] is a 37 y.o. [phone]  female being seen today for a routine annual exam.  Current complaints: positive UPT at home, unsure LMP either first of June or end of June. Reports some indigestion and nausea. Excited about pregnancy.  No LMP recorded (lmp unknown). Patient is pregnant.   The pregnancy intention screening data noted above was reviewed. Potential methods of contraception were discussed. The patient elected to proceed with No data recorded.   Last pap 08/26/21. Results were: NILM w/ HRHPV negative. H/O abnormal pap: no Last mammogram: n/a. Results were: N/A. Family h/o breast cancer: no Last colonoscopy: n/a. Results were: N/A. Family h/o colorectal cancer: no     09/30/2022    9:09 AM 08/26/2021    2:54 PM 01/08/2019    4:03 PM 12/30/2017    9:50 AM 09/21/2016   11:44 AM  Depression screen PHQ 2/9  Decreased Interest 0 0 0 0 0  Down, Depressed, Hopeless 0 0 0 0 0  PHQ - 2 Score 0 0 0 0 0  Altered sleeping 0 0   0  Tired, decreased energy 0 0   0  Change in appetite 0 0   0  Feeling bad or failure about yourself  0 0   0  Trouble concentrating 0 0   0  Moving slowly or fidgety/restless 0 0   0  Suicidal thoughts 0 0   0  PHQ-9 Score 0 0   0        09/30/2022    9:09 AM 08/26/2021    2:55 PM  GAD 7 : Generalized Anxiety Score  Nervous, Anxious, on Edge 0 0  Control/stop worrying 0 0  Worry too much - different things 0 0  Trouble relaxing 0 0  Restless 0 0  Easily annoyed or irritable 0 0  Afraid - awful might happen 0 0  Total GAD 7 Score 0 0     Review of Systems:   Pertinent items are noted in HPI Denies any headaches, blurred vision, fatigue, shortness of breath, chest pain, abdominal pain, abnormal vaginal discharge/itching/odor/irritation, problems with periods, bowel  movements, urination, or intercourse unless otherwise stated above. Pertinent History Reviewed:  Reviewed past medical,surgical, social and family history.  Reviewed problem list, medications and allergies. Physical Assessment:   Vitals:   09/30/22 0851  BP: (!) 113/55  Pulse: 73  Weight: 202 lb (91.6 kg)  Body mass index is 34.67 kg/m.        Physical Examination:   General appearance - well appearing, and in no distress  Mental status - alert, oriented to person, place, and time  Psych:  She has a normal mood and affect  Skin - warm and dry, normal color, no suspicious lesions noted  Chest - effort normal, all lung fields clear to auscultation bilaterally  Heart - normal rate and regular rhythm  Neck:  midline trachea, no thyromegaly or nodules  Breasts - breasts appear normal, no suspicious masses, no skin or nipple changes or  axillary nodes  Abdomen - soft, nontender, nondistended, no masses or organomegaly  Pelvic - deferred  Extremities:  No swelling or varicosities noted  Chaperone present for exam  Results for orders placed or performed in visit on 09/30/22 (from the past 24 hour(s))  POCT urine pregnancy  Collection Time: 09/30/22  8:52 AM  Result Value Ref Range   Preg Test, Ur Positive (A) Negative    Assessment & Plan:  1. Well woman exam Up to date on pap, no other concerns  2. Date of last menstrual period (LMP) unknown 3. Positive pregnancy test Discussed starting prenatal vitamin, rx sent. Unsure of LMP, will schedule for dating u/s and initial prenatal visit. Prescription sent for nausea med. Provided safe meds in pregnancy list   - Prenatal 28-0.8 MG TABS; Take 1 tablet by mouth daily.  Dispense: 30 tablet; Refill: 12 - promethazine (PHENERGAN) 25 MG tablet; Take 1 tablet (25 mg total) by mouth every 6 (six) hours as needed for nausea or vomiting.  Dispense: 30 tablet; Refill: 2   Labs/procedures today:   Mammogram: @ 38yo, or sooner if  problems Colonoscopy: @ 38yo, or sooner if problems  Orders Placed This Encounter  Procedures   POCT urine pregnancy    Meds:  Meds ordered this encounter  Medications   Prenatal 28-0.8 MG TABS    Sig: Take 1 tablet by mouth daily.    Dispense:  30 tablet    Refill:  12   promethazine (PHENERGAN) 25 MG tablet    Sig: Take 1 tablet (25 mg total) by mouth every 6 (six) hours as needed for nausea or vomiting.    Dispense:  30 tablet    Refill:  2    Follow-up: Return in about 1 month (around 10/31/2022), or initial prenatal, for OB VISIT (MD or APP). Future Appointments  Date Time Provider Department Center  10/29/2022 11:30 AM [facility] [place] [id number] None   Albertine Grates, [place]

## 2022-09-30 NOTE — Patient Instructions (Signed)

## 2022-10-15 ENCOUNTER — Emergency Department (HOSPITAL_COMMUNITY)
Admission: EM | Admit: 2022-10-15 | Discharge: 2022-10-15 | Disposition: A | Discharge status: Home / Self Care | Payer: Medicaid Other | Source: Home / Self Care | Attending: Emergency Medicine | Admitting: Emergency Medicine

## 2022-10-15 ENCOUNTER — Other Ambulatory Visit: Payer: Self-pay

## 2022-10-15 DIAGNOSIS — Z3491 Encounter for supervision of normal pregnancy, unspecified, first trimester: Secondary | ICD-10-CM

## 2022-10-15 DIAGNOSIS — Z3A01 Less than 8 weeks gestation of pregnancy: Secondary | ICD-10-CM | POA: Diagnosis not present

## 2022-10-15 DIAGNOSIS — U071 COVID-19: Secondary | ICD-10-CM | POA: Insufficient documentation

## 2022-10-15 DIAGNOSIS — O98511 Other viral diseases complicating pregnancy, first trimester: Secondary | ICD-10-CM | POA: Diagnosis not present

## 2022-10-15 LAB — SARS CORONAVIRUS 2 BY RT PCR: SARS Coronavirus 2 by RT PCR: POSITIVE — AB

## 2022-10-15 MED ORDER — ACETAMINOPHEN 500 MG PO TABS
1000.0000 mg | ORAL_TABLET | Freq: Once | ORAL | Status: AC
  Administered 2022-10-15: 1000 mg via ORAL
  Filled 2022-10-15: qty 2

## 2022-10-15 MED ORDER — ONDANSETRON HCL 4 MG PO TABS: 4.0000 mg | ORAL_TABLET | Freq: Four times a day (QID) | ORAL | 0 refills | Status: DC

## 2022-10-15 MED ORDER — ONDANSETRON 4 MG PO TBDP
4.0000 mg | ORAL_TABLET | Freq: Once | ORAL | Status: AC
  Administered 2022-10-15: 4 mg via ORAL
  Filled 2022-10-15: qty 1

## 2022-10-15 NOTE — Discharge Instructions (Addendum)
You were seen in emergency department for headache and bodyaches.  As we discussed your COVID test was positive.  I recommend taking up to 1000 mg of Tylenol every 6 hours as needed for pain or fever.  I am prescribing you a nausea medication that you can take every 4-6 hours as needed.  Your OBGYN had prescribed you a nausea medication at your prior visit, and I will send an additional one to your pharmacy if needed.   Please follow up with your OBGYN as scheduled. Return to the ER for any new or worsening symptoms.

## 2022-10-15 NOTE — ED Provider Notes (Signed)
[place] EMERGENCY DEPARTMENT AT [facility] Provider Note   CSN: [id number] Arrival date & time: 10/15/22  1606     History  Chief Complaint  Patient presents with   Headache   Generalized Body Aches    [name] is a 37 y.o. female G6P5 who presents the emergency department complaining of headache and body aches x 3 days.  Patient thinks that she was exposed to COVID by family member.  Has not taken anything for her symptoms.  Has some nausea but no vomiting, and some abdominal pain.  No vaginal bleeding.  Has a follow-up appointment with her OB/GYN next week for her initial prenatal visit.  Unknown how far along she is, unsure of LMP.   Headache Associated symptoms: congestion, fever, myalgias and sore throat        Home Medications Prior to Admission medications   Medication Sig Start Date End Date Taking? Authorizing Provider  ondansetron (ZOFRAN) 4 MG tablet Take 1 tablet (4 mg total) by mouth every 6 (six) hours. 10/15/22  Yes Shalinda Burkholder T, PA-C  Prenatal 28-0.8 MG TABS Take 1 tablet by mouth daily. 09/30/22   Sue Lush, FNP  promethazine (PHENERGAN) 25 MG tablet Take 1 tablet (25 mg total) by mouth every 6 (six) hours as needed for nausea or vomiting. 09/30/22   Sue Lush, FNP      Allergies    Bactrim [sulfamethoxazole-trimethoprim]    Review of Systems   Review of Systems  Constitutional:  Positive for chills and fever.  HENT:  Positive for congestion, rhinorrhea and sore throat.   Musculoskeletal:  Positive for myalgias.  Neurological:  Positive for headaches.  All other systems reviewed and are negative.   Physical Exam Updated Vital Signs BP 128/67   Pulse 82   Temp 99.9 F (37.7 C) (Oral)   Resp 16   Ht 5\' 6"  (1.676 m)   Wt 86.2 kg   LMP  (LMP Unknown)   SpO2 96%   BMI 30.67 kg/m  Physical Exam Vitals and nursing note reviewed.  Constitutional:      Appearance: Normal appearance.  HENT:     Head:  Normocephalic and atraumatic.     Nose: Congestion present.     Mouth/Throat:     Lips: Pink.     Mouth: Mucous membranes are moist.     Pharynx: Oropharynx is clear. Uvula midline. Posterior oropharyngeal erythema present.  Eyes:     Conjunctiva/sclera: Conjunctivae normal.  Cardiovascular:     Rate and Rhythm: Normal rate and regular rhythm.  Pulmonary:     Effort: Pulmonary effort is normal. No respiratory distress.     Breath sounds: Normal breath sounds.  Abdominal:     General: There is no distension.     Palpations: Abdomen is soft.     Tenderness: There is no abdominal tenderness.  Skin:    General: Skin is warm and dry.  Neurological:     General: No focal deficit present.     Mental Status: She is alert.     ED Results / Procedures / Treatments   Labs (all labs ordered are listed, but only abnormal results are displayed) Labs Reviewed  SARS CORONAVIRUS 2 BY RT PCR    EKG None  Radiology No results found.  Procedures Procedures    Medications Ordered in ED Medications  acetaminophen (TYLENOL) tablet 1,000 mg (has no administration in time range)  ondansetron (ZOFRAN-ODT) disintegrating tablet 4 mg (has no  administration in time range)    ED Course/ Medical Decision Making/ A&P                                 Medical Decision Making Risk OTC drugs. Prescription drug management.   This patient is a 37 y.o. female who presents to the ED for concern of headache and body aches. Currently pregnant.    Differential diagnoses prior to evaluation: The emergent differential diagnosis includes, but is not limited to,  upper respiratory infection, lower respiratory infection, allergies, asthma, irritants, sinus/esophageal foreign body, medications, reflux, interstitial lung disease, postnasal drip, viral illness, sepsis. This is not an exhaustive differential.   Past Medical History / Co-morbidities / Additional history: Chart reviewed. Pertinent results  include: Depression, HSV  Physical Exam: Physical exam performed. The pertinent findings include: Normal vital signs, no acute distress.  HEENT exam with some posterior pharyngeal erythema and nasal congestion, otherwise unremarkable.  Abdomen soft and nontender.  Lab Tests/Imaging studies: I personally interpreted labs/imaging and the pertinent results include:  respiratory panel positive for COVID.   Medications: I ordered medication including Tylenol and Zofran.  I have reviewed the patients home medicines and have made adjustments as needed.   Disposition: After consideration of the diagnostic results and the patients response to treatment, I feel that emergency department workup does not suggest an emergent condition requiring admission or immediate intervention beyond what has been performed at this time. Patient with symptoms consistent with COVID viral infection.  Vitals are stable. No signs of dehydration, tolerating PO's.  Lungs are clear.   The plan is: Patient will be discharged with instructions to orally hydrate, rest, and use Tylenol for fever, prescribed zofran for nausea.  Encouraged follow up with OBGYN. The patient is safe for discharge and has been instructed to return immediately for worsening symptoms, change in symptoms or any other concerns.  Final Clinical Impression(s) / ED Diagnoses Final diagnoses:  COVID-19  First trimester pregnancy    Rx / DC Orders ED Discharge Orders          Ordered    ondansetron (ZOFRAN) 4 MG tablet  Every 6 hours        10/15/22 1959           Portions of this report may have been transcribed using voice recognition software. Every effort was made to ensure accuracy; however, inadvertent computerized transcription errors may be present.    Jeanella Flattery 10/15/22 Alycia Patten, MD 10/16/22 1204

## 2022-10-15 NOTE — ED Triage Notes (Signed)
Headache, bodyaches and chills x 3 days. Thinks she was exposed to COVID.

## 2022-10-18 ENCOUNTER — Telehealth: Payer: Self-pay

## 2022-10-18 MED ORDER — PRENATAL PLUS 27-1 MG PO TABS: 1.0000 | ORAL_TABLET | Freq: Every day | ORAL | 12 refills | Status: DC

## 2022-10-18 NOTE — Telephone Encounter (Signed)
Refilled PNV 

## 2022-10-18 NOTE — Telephone Encounter (Signed)
Patient states that she has Covid and she is feeling terrible, would like for a nurse to call her ASAP.

## 2022-10-18 NOTE — Telephone Encounter (Signed)
Returned patient's call.  States she has tested positive for Covid and feels terrible.  Congestion, cough, fever.  Advised to treat symptoms and push fluids. Can also take Vitamin D and C. Also requesting different prenatal be sent in to pharmacy as current one has been discontinued.  Advised to check back with pharmacy later this afternoon.

## 2022-10-26 ENCOUNTER — Other Ambulatory Visit (HOSPITAL_COMMUNITY)
Admission: RE | Admit: 2022-10-26 | Discharge: 2022-10-26 | Disposition: A | Discharge status: Home / Self Care | Payer: Medicaid Other | Source: Ambulatory Visit | Attending: Obstetrics & Gynecology | Admitting: Obstetrics & Gynecology

## 2022-10-26 ENCOUNTER — Other Ambulatory Visit (INDEPENDENT_AMBULATORY_CARE_PROVIDER_SITE_OTHER): Payer: Medicaid Other

## 2022-10-26 VITALS — BP 132/76 | HR 69

## 2022-10-26 DIAGNOSIS — R109 Unspecified abdominal pain: Secondary | ICD-10-CM | POA: Diagnosis not present

## 2022-10-26 DIAGNOSIS — M545 Low back pain, unspecified: Secondary | ICD-10-CM

## 2022-10-26 DIAGNOSIS — N898 Other specified noninflammatory disorders of vagina: Secondary | ICD-10-CM | POA: Insufficient documentation

## 2022-10-26 DIAGNOSIS — R350 Frequency of micturition: Secondary | ICD-10-CM | POA: Diagnosis not present

## 2022-10-26 LAB — POCT URINALYSIS DIPSTICK OB
Blood, UA: NEGATIVE
Glucose, UA: NEGATIVE
Ketones, UA: NEGATIVE
Nitrite, UA: NEGATIVE
POC,PROTEIN,UA: NEGATIVE

## 2022-10-26 NOTE — Progress Notes (Signed)
   NURSE VISIT- VAGINITIS  SUBJECTIVE:  [name] is a 37 y.o. [id number] Unknown pregnantfemale here for a vaginal swab for vaginitis screening.  She reports the following symptoms: local irritation and abdominal cramping  for 1 month. Also feels as though she is going to the bathroom frequently and is not emptying her bladder completely.  Denies abnormal vaginal bleeding, significant pelvic pain, fever.  OBJECTIVE:  BP 132/76 (BP Location: Right Arm, Patient Position: Sitting, Cuff Size: Normal)   Pulse 69   LMP  (LMP Unknown)   Appears well, in no apparent distress  ASSESSMENT: Vaginal swab for vaginitis screening  PLAN: Self-collected vaginal probe for Gonorrhea, Chlamydia, Trichomonas, Bacterial Vaginosis, Yeast sent to lab Treatment: to be determined once results are received Follow-up as needed if symptoms persist/worsen, or new symptoms develop  Jobe Marker  10/26/2022 9:22 AM

## 2022-10-27 LAB — CERVICOVAGINAL ANCILLARY ONLY
Bacterial Vaginitis (gardnerella): NEGATIVE
Candida Glabrata: NEGATIVE
Candida Vaginitis: NEGATIVE
Chlamydia: POSITIVE — AB
Comment: NEGATIVE
Comment: NEGATIVE
Comment: NEGATIVE
Comment: NEGATIVE
Comment: NEGATIVE
Comment: NORMAL
Neisseria Gonorrhea: NEGATIVE
Trichomonas: NEGATIVE

## 2022-10-28 ENCOUNTER — Other Ambulatory Visit: Payer: Self-pay | Admitting: Adult Health

## 2022-10-28 ENCOUNTER — Other Ambulatory Visit: Payer: Self-pay | Admitting: Obstetrics & Gynecology

## 2022-10-28 DIAGNOSIS — O3680X0 Pregnancy with inconclusive fetal viability, not applicable or unspecified: Secondary | ICD-10-CM

## 2022-10-28 LAB — URINE CULTURE

## 2022-10-28 MED ORDER — AZITHROMYCIN 500 MG PO TABS: ORAL_TABLET | ORAL | 0 refills | Status: DC

## 2022-10-29 ENCOUNTER — Ambulatory Visit (INDEPENDENT_AMBULATORY_CARE_PROVIDER_SITE_OTHER): Payer: Medicaid Other

## 2022-10-29 DIAGNOSIS — Z3A09 9 weeks gestation of pregnancy: Secondary | ICD-10-CM | POA: Diagnosis not present

## 2022-10-29 DIAGNOSIS — Z3481 Encounter for supervision of other normal pregnancy, first trimester: Secondary | ICD-10-CM | POA: Diagnosis not present

## 2022-10-29 DIAGNOSIS — O3680X0 Pregnancy with inconclusive fetal viability, not applicable or unspecified: Secondary | ICD-10-CM

## 2022-10-29 NOTE — Progress Notes (Signed)
[place] 9+4 wks,single IUP with yolk sac,CRL 26.61 mm,normal ovaries

## 2022-11-15 ENCOUNTER — Encounter: Payer: Self-pay | Admitting: Women's Health

## 2022-11-16 ENCOUNTER — Ambulatory Visit (INDEPENDENT_AMBULATORY_CARE_PROVIDER_SITE_OTHER): Payer: Medicaid Other | Admitting: Women's Health

## 2022-11-16 ENCOUNTER — Encounter: Payer: Medicaid Other | Admitting: *Deleted

## 2022-11-16 ENCOUNTER — Encounter: Payer: Self-pay | Admitting: Women's Health

## 2022-11-16 VITALS — BP 125/72 | HR 85 | Wt 197.0 lb

## 2022-11-16 DIAGNOSIS — Z1332 Encounter for screening for maternal depression: Secondary | ICD-10-CM

## 2022-11-16 DIAGNOSIS — Z3481 Encounter for supervision of other normal pregnancy, first trimester: Secondary | ICD-10-CM

## 2022-11-16 DIAGNOSIS — Z8632 Personal history of gestational diabetes: Secondary | ICD-10-CM | POA: Diagnosis not present

## 2022-11-16 DIAGNOSIS — Z3A12 12 weeks gestation of pregnancy: Secondary | ICD-10-CM | POA: Diagnosis not present

## 2022-11-16 DIAGNOSIS — R768 Other specified abnormal immunological findings in serum: Secondary | ICD-10-CM | POA: Diagnosis not present

## 2022-11-16 DIAGNOSIS — O0990 Supervision of high risk pregnancy, unspecified, unspecified trimester: Secondary | ICD-10-CM | POA: Insufficient documentation

## 2022-11-16 DIAGNOSIS — A749 Chlamydial infection, unspecified: Secondary | ICD-10-CM

## 2022-11-16 DIAGNOSIS — Z3480 Encounter for supervision of other normal pregnancy, unspecified trimester: Secondary | ICD-10-CM

## 2022-11-16 DIAGNOSIS — Z131 Encounter for screening for diabetes mellitus: Secondary | ICD-10-CM | POA: Diagnosis not present

## 2022-11-16 DIAGNOSIS — Z3490 Encounter for supervision of normal pregnancy, unspecified, unspecified trimester: Secondary | ICD-10-CM | POA: Insufficient documentation

## 2022-11-16 MED ORDER — ASPIRIN 81 MG PO TBEC
81.0000 mg | DELAYED_RELEASE_TABLET | Freq: Every day | ORAL | 3 refills | Status: DC
Start: 2022-11-16 — End: 2023-05-25

## 2022-11-16 MED ORDER — DOXYLAMINE-PYRIDOXINE 10-10 MG PO TBEC: DELAYED_RELEASE_TABLET | ORAL | 6 refills | Status: DC

## 2022-11-16 NOTE — Patient Instructions (Signed)
[name], thank you for choosing our office today! We appreciate the opportunity to meet your healthcare needs. You may receive a short survey by mail, e-mail, or through [employer]. If you are happy with your care we would appreciate if you could take just a few minutes to complete the survey questions. We read all of your comments and take your feedback very seriously. Thank you again for choosing our office.  [facility] [employer] [facility] Team at [facility]  [facility] ([address] [place], [place] [address]) Entrance C, located off of [address] Free 24/7 valet parking   Nausea & Vomiting Have saltine crackers or pretzels by your bed and eat a few bites before you raise your head out of bed in the morning Eat small frequent meals throughout the day instead of large meals Drink plenty of fluids throughout the day to stay hydrated, just don't drink a lot of fluids with your meals.  This can make your stomach fill up faster making you feel sick Do not brush your teeth right after you eat Products with real ginger are good for nausea, like ginger ale and ginger hard candy Make sure it says made with real ginger! Sucking on sour candy like lemon heads is also good for nausea If your prenatal vitamins make you nauseated, take them at night so you will sleep through the nausea Sea Bands If you feel like you need medicine for the nausea & vomiting please let us know If you are unable to keep any fluids or food down please let us know   Constipation Drink plenty of fluid, preferably water, throughout the day Eat foods high in fiber such as fruits, vegetables, and grains Exercise, such as walking, is a good way to keep your bowels regular Drink warm fluids, especially warm prune juice, or decaf coffee Eat a 1/2 cup of real oatmeal (not instant), 1/2 cup applesauce, and 1/2-1 cup warm prune juice every day If needed, you may take Colace (docusate sodium) stool  softener once or twice a day to help keep the stool soft.  If you still are having problems with constipation, you may take Miralax once daily as needed to help keep your bowels regular.   Home Blood Pressure Monitoring for Patients   Your provider has recommended that you check your blood pressure (BP) at least once a week at home. If you do not have a blood pressure cuff at home, one will be provided for you. Contact your provider if you have not received your monitor within 1 week.   Helpful Tips for Accurate Home Blood Pressure Checks  Don't smoke, exercise, or drink caffeine 30 minutes before checking your BP Use the restroom before checking your BP (a full bladder can raise your pressure) Relax in a comfortable upright chair Feet on the ground Left arm resting comfortably on a flat surface at the level of your heart Legs uncrossed Back supported Sit quietly and don't talk Place the cuff on your bare arm Adjust snuggly, so that only two fingertips can fit between your skin and the top of the cuff Check 2 readings separated by at least one minute Keep a log of your BP readings For a visual, please reference this diagram: http://ccnc.care/bpdiagram  Provider Name: [facility]     Phone: [phone]  Zone 1: ALL CLEAR  Continue to monitor your symptoms:  BP reading is less than 140 (top number) or less than 90 (bottom  number)  No right upper stomach pain No headaches or seeing spots No feeling nauseated or throwing up No swelling in face and hands  Zone 2: CAUTION Call your doctor's office for any of the following:  BP reading is greater than 140 (top number) or greater than 90 (bottom number)  Stomach pain under your ribs in the middle or right side Headaches or seeing spots Feeling nauseated or throwing up Swelling in face and hands  Zone 3: EMERGENCY  Seek immediate medical care if you have any of the following:  BP reading is greater than160 (top number) or  greater than 110 (bottom number) Severe headaches not improving with Tylenol Serious difficulty catching your breath Any worsening symptoms from Zone 2    First Trimester of Pregnancy The first trimester of pregnancy is from week 1 until the end of week 12 (months 1 through 3). A week after a sperm fertilizes an egg, the egg will implant on the wall of the uterus. This embryo will begin to develop into a baby. Genes from you and your partner are forming the baby. The female genes determine whether the baby is a boy or a girl. At 6-8 weeks, the eyes and face are formed, and the heartbeat can be seen on ultrasound. At the end of 12 weeks, all the baby's organs are formed.  Now that you are pregnant, you will want to do everything you can to have a healthy baby. Two of the most important things are to get good prenatal care and to follow your health care provider's instructions. Prenatal care is all the medical care you receive before the baby's birth. This care will help prevent, find, and treat any problems during the pregnancy and childbirth. BODY CHANGES Your body goes through many changes during pregnancy. The changes vary from woman to woman.  You may gain or lose a couple of pounds at first. You may feel sick to your stomach (nauseous) and throw up (vomit). If the vomiting is uncontrollable, call your health care provider. You may tire easily. You may develop headaches that can be relieved by medicines approved by your health care provider. You may urinate more often. Painful urination may mean you have a bladder infection. You may develop heartburn as a result of your pregnancy. You may develop constipation because certain hormones are causing the muscles that push waste through your intestines to slow down. You may develop hemorrhoids or swollen, bulging veins (varicose veins). Your breasts may begin to grow larger and become tender. Your nipples may stick out more, and the tissue that  surrounds them (areola) may become darker. Your gums may bleed and may be sensitive to brushing and flossing. Dark spots or blotches (chloasma, mask of pregnancy) may develop on your face. This will likely fade after the baby is born. Your menstrual periods will stop. You may have a loss of appetite. You may develop cravings for certain kinds of food. You may have changes in your emotions from day to day, such as being excited to be pregnant or being concerned that something may go wrong with the pregnancy and baby. You may have more vivid and strange dreams. You may have changes in your hair. These can include thickening of your hair, rapid growth, and changes in texture. Some women also have hair loss during or after pregnancy, or hair that feels dry or thin. Your hair will most likely return to normal after your baby is born. WHAT TO EXPECT AT YOUR PRENATAL  VISITS During a routine prenatal visit: You will be weighed to make sure you and the baby are growing normally. Your blood pressure will be taken. Your abdomen will be measured to track your baby's growth. The fetal heartbeat will be listened to starting around week 10 or 12 of your pregnancy. Test results from any previous visits will be discussed. Your health care provider may ask you: How you are feeling. If you are feeling the baby move. If you have had any abnormal symptoms, such as leaking fluid, bleeding, severe headaches, or abdominal cramping. If you have any questions. Other tests that may be performed during your first trimester include: Blood tests to find your blood type and to check for the presence of any previous infections. They will also be used to check for low iron levels (anemia) and Rh antibodies. Later in the pregnancy, blood tests for diabetes will be done along with other tests if problems develop. Urine tests to check for infections, diabetes, or protein in the urine. An ultrasound to confirm the proper growth  and development of the baby. An amniocentesis to check for possible genetic problems. Fetal screens for spina bifida and Down syndrome. You may need other tests to make sure you and the baby are doing well. HOME CARE INSTRUCTIONS  Medicines Follow your health care provider's instructions regarding medicine use. Specific medicines may be either safe or unsafe to take during pregnancy. Take your prenatal vitamins as directed. If you develop constipation, try taking a stool softener if your health care provider approves. Diet Eat regular, well-balanced meals. Choose a variety of foods, such as meat or vegetable-based protein, fish, milk and low-fat dairy products, vegetables, fruits, and whole grain breads and cereals. Your health care provider will help you determine the amount of weight gain that is right for you. Avoid raw meat and uncooked cheese. These carry germs that can cause birth defects in the baby. Eating four or five small meals rather than three large meals a day may help relieve nausea and vomiting. If you start to feel nauseous, eating a few soda crackers can be helpful. Drinking liquids between meals instead of during meals also seems to help nausea and vomiting. If you develop constipation, eat more high-fiber foods, such as fresh vegetables or fruit and whole grains. Drink enough fluids to keep your urine clear or pale yellow. Activity and Exercise Exercise only as directed by your health care provider. Exercising will help you: Control your weight. Stay in shape. Be prepared for labor and delivery. Experiencing pain or cramping in the lower abdomen or low back is a good sign that you should stop exercising. Check with your health care provider before continuing normal exercises. Try to avoid standing for long periods of time. Move your legs often if you must stand in one place for a long time. Avoid heavy lifting. Wear low-heeled shoes, and practice good posture. You may  continue to have sex unless your health care provider directs you otherwise. Relief of Pain or Discomfort Wear a good support bra for breast tenderness.   Take warm sitz baths to soothe any pain or discomfort caused by hemorrhoids. Use hemorrhoid cream if your health care provider approves.   Rest with your legs elevated if you have leg cramps or low back pain. If you develop varicose veins in your legs, wear support hose. Elevate your feet for 15 minutes, 3-4 times a day. Limit salt in your diet. Prenatal Care Schedule your prenatal visits by the  twelfth week of pregnancy. They are usually scheduled monthly at first, then more often in the last 2 months before delivery. Write down your questions. Take them to your prenatal visits. Keep all your prenatal visits as directed by your health care provider. Safety Wear your seat belt at all times when driving. Make a list of emergency phone numbers, including numbers for family, friends, the hospital, and police and fire departments. General Tips Ask your health care provider for a referral to a local prenatal education class. Begin classes no later than at the beginning of month 6 of your pregnancy. Ask for help if you have counseling or nutritional needs during pregnancy. Your health care provider can offer advice or refer you to specialists for help with various needs. Do not use hot tubs, steam rooms, or saunas. Do not douche or use tampons or scented sanitary pads. Do not cross your legs for long periods of time. Avoid cat litter boxes and soil used by cats. These carry germs that can cause birth defects in the baby and possibly loss of the fetus by miscarriage or stillbirth. Avoid all smoking, herbs, alcohol, and medicines not prescribed by your health care provider. Chemicals in these affect the formation and growth of the baby. Schedule a dentist appointment. At home, brush your teeth with a soft toothbrush and be gentle when you floss. SEEK  MEDICAL CARE IF:  You have dizziness. You have mild pelvic cramps, pelvic pressure, or nagging pain in the abdominal area. You have persistent nausea, vomiting, or diarrhea. You have a bad smelling vaginal discharge. You have pain with urination. You notice increased swelling in your face, hands, legs, or ankles. SEEK IMMEDIATE MEDICAL CARE IF:  You have a fever. You are leaking fluid from your vagina. You have spotting or bleeding from your vagina. You have severe abdominal cramping or pain. You have rapid weight gain or loss. You vomit blood or material that looks like coffee grounds. You are exposed to [place] measles and have never had them. You are exposed to fifth disease or chickenpox. You develop a severe headache. You have shortness of breath. You have any kind of trauma, such as from a fall or a car accident. Document Released: 02/09/2001 Document Revised: 07/02/2013 Document Reviewed: 12/26/2012 [facility] Patient Information 2015 [place], [place]. This information is not intended to replace advice given to you by your health care provider. Make sure you discuss any questions you have with your health care provider.

## 2022-11-16 NOTE — Progress Notes (Signed)
INITIAL OBSTETRICAL VISIT Patient name: [name] MRN [id number]  Date of birth: 1985-12-31 Chief Complaint:   Initial Prenatal Visit  History of Present Illness:   [name] is a 37 y.o. G77P5005 African-American female at [redacted]w[redacted]d by [place] at 9 weeks with an Estimated Date of Delivery: 05/30/23 being seen today for her initial obstetrical visit.   No LMP recorded (lmp unknown). Patient is pregnant. Her obstetrical history is significant for  term uncomplicated SVB x 5, last pregnancy complicated by A2DM, 2hr pp GTT was wnl .   Today she reports N/V, has phenergan, ran out of zofran. Discussed diclegis- wants to try.  Last pap 08/26/21. Results were: NILM w/ HRHPV negative     11/16/2022    3:20 PM 09/30/2022    9:09 AM 08/26/2021    2:54 PM 01/08/2019    4:03 PM 12/30/2017    9:50 AM  Depression screen PHQ 2/9  Decreased Interest 0 0 0 0 0  Down, Depressed, Hopeless 0 0 0 0 0  PHQ - 2 Score 0 0 0 0 0  Altered sleeping 0 0 0    Tired, decreased energy 0 0 0    Change in appetite 0 0 0    Feeling bad or failure about yourself  0 0 0    Trouble concentrating 0 0 0    Moving slowly or fidgety/restless 0 0 0    Suicidal thoughts 0 0 0    PHQ-9 Score 0 0 0          11/16/2022    3:20 PM 09/30/2022    9:09 AM 08/26/2021    2:55 PM  GAD 7 : Generalized Anxiety Score  Nervous, Anxious, on Edge 0 0 0  Control/stop worrying 0 0 0  Worry too much - different things 0 0 0  Trouble relaxing 0 0 0  Restless 0 0 0  Easily annoyed or irritable 0 0 0  Afraid - awful might happen 0 0 0  Total GAD 7 Score 0 0 0     Review of Systems:   Pertinent items are noted in HPI Denies cramping/contractions, leakage of fluid, vaginal bleeding, abnormal vaginal discharge w/ itching/odor/irritation, headaches, visual changes, shortness of breath, chest pain, abdominal pain, severe nausea/vomiting, or problems with urination or bowel movements unless otherwise stated above.  Pertinent  History Reviewed:  Reviewed past medical,surgical, social, obstetrical and family history.  Reviewed problem list, medications and allergies. OB History  Gravida Para Term Preterm AB Living  6 5 5     5   SAB IAB Ectopic Multiple Live Births        0 5    # Outcome Date GA Lbr Len/2nd Weight Sex Type Anes PTL Lv  6 Current           5 Term 04/21/17 [redacted]w[redacted]d 15:20 / 00:10 7 lb 3.5 oz (3.275 kg) M Vag-Spont None  LIV     Complications: Gestational diabetes  4 Term 01/16/14 [redacted]w[redacted]d 05:55 / 00:08 7 lb 5 oz (3.317 kg) F Vag-Spont None N LIV     Birth Comments: Neg Hep B, HIV, RPR, +GBS Observed for 48 hrs b/o GBS Hx of maternal THC use, but neg urine and meconiium drug screen on baby No jaundice, no feeding or respiratory problems  3 Term 11/26/09 [redacted]w[redacted]d  8 lb (3.629 kg) M Vag-Spont None N LIV  2 Term 09/09/05 [redacted]w[redacted]d  8 lb (3.629 kg) F Vag-Spont None N LIV  1 Term 04/08/04 [redacted]w[redacted]d  8 lb (3.629 kg) M Vag-Spont None N LIV   Physical Assessment:   Vitals:   11/16/22 1522  BP: 125/72  Pulse: 85  Weight: 197 lb (89.4 kg)  Body mass index is 31.8 kg/m.       Physical Examination:  General appearance - well appearing, and in no distress  Mental status - alert, oriented to person, place, and time  Psych:  She has a normal mood and affect  Skin - warm and dry, normal color, no suspicious lesions noted  Chest - effort normal, all lung fields clear to auscultation bilaterally  Heart - normal rate and regular rhythm  Abdomen - soft, nontender  Extremities:  No swelling or varicosities noted  Thin prep pap is not done  Chaperone: N/A    TODAY'S FHR via doppler 152  No results found for this or any previous visit (from the past 24 hour(s)).  Assessment & Plan:  1) Low-Risk Pregnancy G6P5005 at [redacted]w[redacted]d with an Estimated Date of Delivery: 05/30/23   2) Initial OB visit  3) AMA 36yo  4) Recent +CT>POC today  5) H/O GDM> A1C today  6) N/V> rx diclegis  Meds:  Meds ordered this encounter   Medications   aspirin EC 81 MG tablet    Sig: Take 1 tablet (81 mg total) by mouth daily. Swallow whole.    Dispense:  90 tablet    Refill:  3   Doxylamine-Pyridoxine (DICLEGIS) 10-10 MG TBEC    Sig: 2 tabs q hs, if sx persist add 1 tab q am on day 3, if sx persist add 1 tab q afternoon on day 4    Dispense:  100 tablet    Refill:  6    Initial labs obtained Continue prenatal vitamins Reviewed n/v relief measures and warning s/s to report Reviewed recommended weight gain based on pre-gravid BMI Encouraged well-balanced diet Genetic & carrier screening discussed: requests Panorama, AFP, and Horizon , no NT u/s available Ultrasound discussed; fetal survey: requested CCNC completed> form faxed if has or is planning to apply for medicaid The nature of [facility] [employer] with multiple MDs and other Advanced Practice Providers was explained to patient; also emphasized that fellows, residents, and students are part of our team. Does have home bp cuff. Office bp cuff given: no. Rx sent: n/a. Check bp weekly, let us know if consistently >140/90.   Indications for ASA therapy (per uptodate) OR Two or more of the following: Obesity (BMI>30 kg/m2) Yes Age >=35 years Yes Sociodemographic characteristics (African American race, low socioeconomic level) Yes  Indications for early A1C (per uptodate) BMI >=25 (>=23 in Asian women) AND one of the following GDM in a previous pregnancy Yes  Follow-up: Return in about 4 weeks (around 12/14/2022) for LROB, AFP, CNM, in person; 7wks from now anatomy u/s and LROB w/ CNM.   Orders Placed This Encounter  Procedures   Urine Culture   GC/Chlamydia Probe Amp   CBC/D/Plt+RPR+Rh+ABO+RubIgG...   PANORAMA PRENATAL TEST   Hemoglobin A1c   HORIZON CUSTOM    Cheral Marker CNM, [facility] 11/16/2022 3:51 PM

## 2022-11-18 LAB — URINE CULTURE

## 2022-11-19 LAB — GC/CHLAMYDIA PROBE AMP
Chlamydia trachomatis, NAA: NEGATIVE
Neisseria Gonorrhoeae by PCR: NEGATIVE

## 2022-11-22 ENCOUNTER — Other Ambulatory Visit: Payer: Self-pay | Admitting: Obstetrics and Gynecology

## 2022-11-22 DIAGNOSIS — Z3201 Encounter for pregnancy test, result positive: Secondary | ICD-10-CM

## 2022-11-23 ENCOUNTER — Other Ambulatory Visit: Payer: Medicaid Other

## 2022-11-23 DIAGNOSIS — R7309 Other abnormal glucose: Secondary | ICD-10-CM | POA: Diagnosis not present

## 2022-11-23 DIAGNOSIS — Z8632 Personal history of gestational diabetes: Secondary | ICD-10-CM

## 2022-11-23 DIAGNOSIS — Z131 Encounter for screening for diabetes mellitus: Secondary | ICD-10-CM | POA: Diagnosis not present

## 2022-11-24 ENCOUNTER — Other Ambulatory Visit: Payer: Self-pay | Admitting: Women's Health

## 2022-11-24 ENCOUNTER — Encounter: Payer: Self-pay | Admitting: Women's Health

## 2022-11-24 ENCOUNTER — Encounter: Payer: Self-pay | Admitting: *Deleted

## 2022-11-24 DIAGNOSIS — Z8632 Personal history of gestational diabetes: Secondary | ICD-10-CM | POA: Insufficient documentation

## 2022-11-24 DIAGNOSIS — O24419 Gestational diabetes mellitus in pregnancy, unspecified control: Secondary | ICD-10-CM | POA: Insufficient documentation

## 2022-11-24 DIAGNOSIS — O24410 Gestational diabetes mellitus in pregnancy, diet controlled: Secondary | ICD-10-CM

## 2022-11-24 LAB — GLUCOSE TOLERANCE, 2 HOURS W/ 1HR
Glucose, 1 hour: 151 mg/dL (ref 70–179)
Glucose, 2 hour: 117 mg/dL (ref 70–152)
Glucose, Fasting: 94 mg/dL — ABNORMAL HIGH (ref 70–91)

## 2022-11-24 MED ORDER — ACCU-CHEK GUIDE VI STRP
ORAL_STRIP | 12 refills | Status: DC
Start: 2022-11-24 — End: 2023-05-25

## 2022-11-24 MED ORDER — ACCU-CHEK GUIDE ME W/DEVICE KIT
1.0000 | PACK | Freq: Four times a day (QID) | 0 refills | Status: DC
Start: 2022-11-24 — End: 2023-05-25

## 2022-11-24 MED ORDER — ACCU-CHEK SOFTCLIX LANCETS MISC
12 refills | Status: DC
Start: 2022-11-24 — End: 2023-05-25

## 2022-11-27 LAB — PANORAMA PRENATAL TEST FULL PANEL:PANORAMA TEST PLUS 5 ADDITIONAL MICRODELETIONS: FETAL FRACTION: 5.7

## 2022-11-28 LAB — HORIZON CUSTOM: REPORT SUMMARY: NEGATIVE

## 2022-12-01 ENCOUNTER — Encounter: Payer: Medicaid Other | Attending: Women's Health | Admitting: Dietician

## 2022-12-01 ENCOUNTER — Encounter: Payer: Self-pay | Admitting: Dietician

## 2022-12-01 VITALS — Wt 199.1 lb

## 2022-12-01 DIAGNOSIS — O24419 Gestational diabetes mellitus in pregnancy, unspecified control: Secondary | ICD-10-CM | POA: Insufficient documentation

## 2022-12-01 NOTE — Progress Notes (Signed)
Patient was seen on 12/01/2022 for Gestational Diabetes self-management class at [facility]. The following learning objectives were met by the patient during this course:  States the definition of Gestational Diabetes States why dietary management is important in controlling blood glucose Describes the effects each nutrient has on blood glucose levels Demonstrates ability to create a balanced meal plan Demonstrates carbohydrate counting  States when to check blood glucose levels Demonstrates proper blood glucose monitoring techniques States the effect of stress and exercise on blood glucose levels States the importance of limiting caffeine and abstaining from alcohol and smoking  Blood glucose monitor: Pt presents with Accu Chek Guide me glucometer and states access to adequate supplies to monitor as discussed.    Pt reports self monitoring blood sugar today before breakfast with a value of "95" mg/dL.  Blood glucose reading: 116 mg/dL (Pt reports obtained ~ 2 hour post prandial)  Patient instructed to monitor glucose levels four times daily: FBS: 60 - <90 1 hour: <140 2 hour: <120  *Patient received handouts: Nutrition Diabetes and Pregnancy Carbohydrate Counting List Blood glucose log Snack ideas for diabetes during pregnancy  Patient will be seen for follow-up as needed.

## 2022-12-14 ENCOUNTER — Ambulatory Visit (INDEPENDENT_AMBULATORY_CARE_PROVIDER_SITE_OTHER): Payer: Medicaid Other | Admitting: Women's Health

## 2022-12-14 ENCOUNTER — Encounter: Payer: Self-pay | Admitting: Women's Health

## 2022-12-14 VITALS — BP 129/70 | HR 83 | Wt 200.0 lb

## 2022-12-14 DIAGNOSIS — Z3A16 16 weeks gestation of pregnancy: Secondary | ICD-10-CM

## 2022-12-14 DIAGNOSIS — Z363 Encounter for antenatal screening for malformations: Secondary | ICD-10-CM

## 2022-12-14 DIAGNOSIS — O0990 Supervision of high risk pregnancy, unspecified, unspecified trimester: Secondary | ICD-10-CM

## 2022-12-14 DIAGNOSIS — Z1379 Encounter for other screening for genetic and chromosomal anomalies: Secondary | ICD-10-CM

## 2022-12-14 DIAGNOSIS — O0992 Supervision of high risk pregnancy, unspecified, second trimester: Secondary | ICD-10-CM

## 2022-12-14 MED ORDER — METFORMIN HCL 500 MG PO TABS
500.0000 mg | ORAL_TABLET | Freq: Two times a day (BID) | ORAL | 3 refills | Status: DC
Start: 2022-12-14 — End: 2023-04-18

## 2022-12-14 NOTE — Progress Notes (Signed)
HIGH-RISK PREGNANCY VISIT Patient name: [name] MRN [id number]  Date of birth: [birth year] Chief Complaint:   Routine Prenatal Visit (AFP)  History of Present Illness:   [name] is a 37 y.o. G25P5005 female at [redacted]w[redacted]d with an Estimated Date of Delivery: 05/30/23 being seen today for ongoing management of a high-risk pregnancy complicated by gestational diabetes mellitus dx @ 13wks  Today she reports  fbs 85-104, 2hr pp 80-261 w/ multiple elevated. Has seen dietician, is trying to eat better . Contractions: Not present.  .  Movement: Absent. denies leaking of fluid.      12/01/2022   11:17 AM 11/16/2022    3:20 PM 09/30/2022    9:09 AM 08/26/2021    2:54 PM 01/08/2019    4:03 PM  Depression screen PHQ 2/9  Decreased Interest 0 0 0 0 0  Down, Depressed, Hopeless 0 0 0 0 0  PHQ - 2 Score 0 0 0 0 0  Altered sleeping  0 0 0   Tired, decreased energy  0 0 0   Change in appetite  0 0 0   Feeling bad or failure about yourself   0 0 0   Trouble concentrating  0 0 0   Moving slowly or fidgety/restless  0 0 0   Suicidal thoughts  0 0 0   PHQ-9 Score  0 0 0         11/16/2022    3:20 PM 09/30/2022    9:09 AM 08/26/2021    2:55 PM  GAD 7 : Generalized Anxiety Score  Nervous, Anxious, on Edge 0 0 0  Control/stop worrying 0 0 0  Worry too much - different things 0 0 0  Trouble relaxing 0 0 0  Restless 0 0 0  Easily annoyed or irritable 0 0 0  Afraid - awful might happen 0 0 0  Total GAD 7 Score 0 0 0     Review of Systems:   Pertinent items are noted in HPI Denies abnormal vaginal discharge w/ itching/odor/irritation, headaches, visual changes, shortness of breath, chest pain, abdominal pain, severe nausea/vomiting, or problems with urination or bowel movements unless otherwise stated above. Pertinent History Reviewed:  Reviewed past medical,surgical, social, obstetrical and family history.  Reviewed problem list, medications and allergies. Physical Assessment:    Vitals:   12/14/22 1551  BP: 129/70  Pulse: 83  Weight: 200 lb (90.7 kg)  Body mass index is 32.28 kg/m.           Physical Examination:   General appearance: alert, well appearing, and in no distress  Mental status: alert, oriented to person, place, and time  Skin: warm & dry   Extremities: Edema: None    Cardiovascular: normal heart rate noted  Respiratory: normal respiratory effort, no distress  Abdomen: gravid, soft, non-tender  Pelvic: Cervical exam deferred         Fetal Status: Fetal Heart Rate (bpm): 144   Movement: Absent    Fetal Surveillance Testing today: doppler   Chaperone: N/A    No results found for this or any previous visit (from the past 24 hour(s)).  Assessment & Plan:  High-risk pregnancy: [id number] at [redacted]w[redacted]d with an Estimated Date of Delivery: 05/30/23   1) A2/BDM, rx metformin 500mg  BID, send mychart message if still elevated after starting meds. Start high protein snack at bedtime, walking after meals  Meds:  Meds ordered this encounter  Medications   metFORMIN (GLUCOPHAGE) 500 MG tablet  Sig: Take 1 tablet (500 mg total) by mouth 2 (two) times daily with a meal.    Dispense:  60 tablet    Refill:  3    Labs/procedures today: AFP  Treatment Plan:  [place] 24, 28, 32, 36wk       2x/wk nst or weekly BPP @ 32wks    Deliver @ 39wks:_____   Reviewed: Preterm labor symptoms and general obstetric precautions including but not limited to vaginal bleeding, contractions, leaking of fluid and fetal movement were reviewed in detail with the patient.  All questions were answered. Does have home bp cuff. Office bp cuff given: not applicable. Check bp weekly, let us know if consistently >140 and/or >90.  Follow-up: Return for As scheduled.   Future Appointments  Date Time Provider Department Center  01/04/2023  2:15 PM [facility] [place] [facility] None  01/04/2023  3:10 PM Laurajean Hosek, Merlene Laughter, CNM [facility]    Orders Placed This Encounter  Procedures   US  OB Comp + 14 Wk   AFP, Serum, Open Spina [address]   Cheral Marker [place], [facility] 12/14/2022 4:35 PM

## 2022-12-14 NOTE — Patient Instructions (Signed)
Branden, thank you for choosing our office today! We appreciate the opportunity to meet your healthcare needs. You may receive a short survey by mail, e-mail, or through [employer]. If you are happy with your care we would appreciate if you could take just a few minutes to complete the survey questions. We read all of your comments and take your feedback very seriously. Thank you again for choosing our office.  Center for [employer] Team at [facility] at [facility] ([address] [place], [place] [address]) Entrance C, located off of [address] Free 24/7 valet parking  Go to [email or web address] to register for FREE online childbirth classes  Call the office [phone]) or go to [facility] ([facility]) if: You begin to severe cramping Your water breaks.  Sometimes it is a big gush of fluid, sometimes it is just a trickle that keeps getting your panties wet or running down your legs You have vaginal bleeding.  It is normal to have a small amount of spotting if your cervix was checked.   [facility] Pediatricians/Family Doctors [facility]): [address] Colette Ribas, [phone]           [facility]: [address], [phone]                [facility]): [address], [phone] (call to ask if accepting patients) [facility] Department: [address], [place], [phone]    [facility] Pediatricians/Family Doctors [facility]): [phone], [phone] [facility]: [address] [place], [phone] [facility] [place]: [address], [phone]  [facility] Doctors  [facility]): [phone] [facility]: [address], [phone]   [facility] Doctors [facility]: [address], [phone]  [facility] Doctors  [facility]: [phone], [phone]  Home Blood Pressure Monitoring for Patients   Your provider has recommended that you check your blood pressure (BP) at least once a week at home. If you do not have a blood pressure cuff at home, one will be provided for you. Contact your provider if you have not received your monitor within 1 week.   Helpful Tips for Accurate Home Blood Pressure Checks  Don't smoke, exercise, or drink caffeine 30 minutes before checking your BP Use the restroom before checking your BP (a full bladder can raise your pressure) Relax in a comfortable upright chair Feet on the ground Left arm resting comfortably on a flat surface at the level of your heart Legs uncrossed Back supported Sit quietly and don't talk Place the cuff on your bare arm Adjust snuggly, so that only two fingertips can fit between your skin and the top of the cuff Check 2 readings separated by at least one minute Keep a log of your BP readings For a visual, please reference this diagram: http://ccnc.care/bpdiagram  Provider Name: [facility]     Phone: [phone]  Zone 1: ALL CLEAR  Continue to monitor your symptoms:  BP reading is less than 140 (top number) or less than 90 (bottom number)  No right upper stomach pain No headaches or seeing spots No feeling nauseated or throwing up No swelling in face and hands  Zone 2: CAUTION Call your doctor's office for any of the following:  BP reading is greater than 140 (top number) or greater than  90 (bottom number)  Stomach pain under your ribs in the middle or right side Headaches or seeing spots Feeling nauseated or throwing up Swelling in face and hands  Zone 3: EMERGENCY  Seek immediate medical care if you have any of the following:  BP reading is greater than160 (top number) or greater than 110 (bottom number) Severe headaches not improving with Tylenol Serious difficulty catching your breath Any worsening symptoms from  Zone 2     Second Trimester of Pregnancy The second trimester is from week 14 through week 27 (months 4 through 6). The second trimester is often a time when you feel your best. Your body has adjusted to being pregnant, and you begin to feel better physically. Usually, morning sickness has lessened or quit completely, you may have more energy, and you may have an increase in appetite. The second trimester is also a time when the fetus is growing rapidly. At the end of the sixth month, the fetus is about 9 inches long and weighs about 1 pounds. You will likely begin to feel the baby move (quickening) between 16 and 20 weeks of pregnancy. Body changes during your second trimester Your body continues to go through many changes during your second trimester. The changes vary from woman to woman. Your weight will continue to increase. You will notice your lower abdomen bulging out. You may begin to get stretch marks on your hips, abdomen, and breasts. You may develop headaches that can be relieved by medicines. The medicines should be approved by your health care provider. You may urinate more often because the fetus is pressing on your bladder. You may develop or continue to have heartburn as a result of your pregnancy. You may develop constipation because certain hormones are causing the muscles that push waste through your intestines to slow down. You may develop hemorrhoids or swollen, bulging veins (varicose veins). You may have back pain. This is caused by: Weight gain. Pregnancy hormones that are relaxing the joints in your pelvis. A shift in weight and the muscles that support your balance. Your breasts will continue to grow and they will continue to become tender. Your gums may bleed and may be sensitive to brushing and flossing. Dark spots or blotches (chloasma, mask of pregnancy) may develop on your face. This will likely fade after the baby is born. A dark line from your belly button to  the pubic area (linea nigra) may appear. This will likely fade after the baby is born. You may have changes in your hair. These can include thickening of your hair, rapid growth, and changes in texture. Some women also have hair loss during or after pregnancy, or hair that feels dry or thin. Your hair will most likely return to normal after your baby is born.  What to expect at prenatal visits During a routine prenatal visit: You will be weighed to make sure you and the fetus are growing normally. Your blood pressure will be taken. Your abdomen will be measured to track your baby's growth. The fetal heartbeat will be listened to. Any test results from the previous visit will be discussed.  Your health care provider may ask you: How you are feeling. If you are feeling the baby move. If you have had any abnormal symptoms, such as leaking fluid, bleeding, severe headaches, or abdominal cramping. If you are using any tobacco products, including cigarettes, chewing tobacco, and electronic cigarettes. If you have any questions.  Other tests that may be performed during  your second trimester include: Blood tests that check for: Low iron levels (anemia). High blood sugar that affects pregnant women (gestational diabetes) between 29 and 28 weeks. Rh antibodies. This is to check for a protein on red blood cells (Rh factor). Urine tests to check for infections, diabetes, or protein in the urine. An ultrasound to confirm the proper growth and development of the baby. An amniocentesis to check for possible genetic problems. Fetal screens for spina bifida and Down syndrome. HIV (human immunodeficiency virus) testing. Routine prenatal testing includes screening for HIV, unless you choose not to have this test.  Follow these instructions at home: Medicines Follow your health care provider's instructions regarding medicine use. Specific medicines may be either safe or unsafe to take during  pregnancy. Take a prenatal vitamin that contains at least 600 micrograms (mcg) of folic acid. If you develop constipation, try taking a stool softener if your health care provider approves. Eating and drinking Eat a balanced diet that includes fresh fruits and vegetables, whole grains, good sources of protein such as meat, eggs, or tofu, and low-fat dairy. Your health care provider will help you determine the amount of weight gain that is right for you. Avoid raw meat and uncooked cheese. These carry germs that can cause birth defects in the baby. If you have low calcium intake from food, talk to your health care provider about whether you should take a daily calcium supplement. Limit foods that are high in fat and processed sugars, such as fried and sweet foods. To prevent constipation: Drink enough fluid to keep your urine clear or pale yellow. Eat foods that are high in fiber, such as fresh fruits and vegetables, whole grains, and beans. Activity Exercise only as directed by your health care provider. Most women can continue their usual exercise routine during pregnancy. Try to exercise for 30 minutes at least 5 days a week. Stop exercising if you experience uterine contractions. Avoid heavy lifting, wear low heel shoes, and practice good posture. A sexual relationship may be continued unless your health care provider directs you otherwise. Relieving pain and discomfort Wear a good support bra to prevent discomfort from breast tenderness. Take warm sitz baths to soothe any pain or discomfort caused by hemorrhoids. Use hemorrhoid cream if your health care provider approves. Rest with your legs elevated if you have leg cramps or low back pain. If you develop varicose veins, wear support hose. Elevate your feet for 15 minutes, 3-4 times a day. Limit salt in your diet. Prenatal Care Write down your questions. Take them to your prenatal visits. Keep all your prenatal visits as told by your health  care provider. This is important. Safety Wear your seat belt at all times when driving. Make a list of emergency phone numbers, including numbers for family, friends, the hospital, and police and fire departments. General instructions Ask your health care provider for a referral to a local prenatal education class. Begin classes no later than the beginning of month 6 of your pregnancy. Ask for help if you have counseling or nutritional needs during pregnancy. Your health care provider can offer advice or refer you to specialists for help with various needs. Do not use hot tubs, steam rooms, or saunas. Do not douche or use tampons or scented sanitary pads. Do not cross your legs for long periods of time. Avoid cat litter boxes and soil used by cats. These carry germs that can cause birth defects in the baby and possibly loss of the  fetus by miscarriage or stillbirth. Avoid all smoking, herbs, alcohol, and unprescribed drugs. Chemicals in these products can affect the formation and growth of the baby. Do not use any products that contain nicotine or tobacco, such as cigarettes and e-cigarettes. If you need help quitting, ask your health care provider. Visit your dentist if you have not gone yet during your pregnancy. Use a soft toothbrush to brush your teeth and be gentle when you floss. Contact a health care provider if: You have dizziness. You have mild pelvic cramps, pelvic pressure, or nagging pain in the abdominal area. You have persistent nausea, vomiting, or diarrhea. You have a bad smelling vaginal discharge. You have pain when you urinate. Get help right away if: You have a fever. You are leaking fluid from your vagina. You have spotting or bleeding from your vagina. You have severe abdominal cramping or pain. You have rapid weight gain or weight loss. You have shortness of breath with chest pain. You notice sudden or extreme swelling of your face, hands, ankles, feet, or legs. You  have not felt your baby move in over an hour. You have severe headaches that do not go away when you take medicine. You have vision changes. Summary The second trimester is from week 14 through week 27 (months 4 through 6). It is also a time when the fetus is growing rapidly. Your body goes through many changes during pregnancy. The changes vary from woman to woman. Avoid all smoking, herbs, alcohol, and unprescribed drugs. These chemicals affect the formation and growth your baby. Do not use any tobacco products, such as cigarettes, chewing tobacco, and e-cigarettes. If you need help quitting, ask your health care provider. Contact your health care provider if you have any questions. Keep all prenatal visits as told by your health care provider. This is important. This information is not intended to replace advice given to you by your health care provider. Make sure you discuss any questions you have with your health care provider. Document Released: 02/09/2001 Document Revised: 07/24/2015 Document Reviewed: 04/18/2012 Elsevier Interactive Patient Education  2017 [employer].

## 2022-12-20 LAB — AFP, SERUM, OPEN SPINA BIFIDA
AFP MoM: 0.85
AFP Value: 25.9 ng/mL
Gest. Age on Collection Date: 16 wk
Maternal Age At EDD: 38.2 a
OSBR Risk 1 IN: 10000
Weight: 200 [lb_av]

## 2022-12-29 ENCOUNTER — Ambulatory Visit
Admission: EM | Admit: 2022-12-29 | Discharge: 2022-12-29 | Disposition: A | Discharge status: Home / Self Care | Payer: Medicaid Other

## 2022-12-29 DIAGNOSIS — N949 Unspecified condition associated with female genital organs and menstrual cycle: Secondary | ICD-10-CM

## 2022-12-29 NOTE — ED Triage Notes (Signed)
Pt c/o foreign body in the vagina pt is [redacted] weeks pregnant. Pt states she thinks there may be a condom in her vagina, that has been there for about 1 week, is causing discomfort.

## 2022-12-29 NOTE — Discharge Instructions (Signed)
Please call your OB provider to make an appointment to discuss your concerns.  I did not observe or feel a foreign body in your vagina today.

## 2022-12-29 NOTE — ED Provider Notes (Signed)
[facility]    CSN: [id number] Arrival date & time: 12/29/22  1216      History   Chief Complaint No chief complaint on file.   HPI [name] is a 37 y.o. female.   Patient today for possible foreign body inside her vagina.  Reports that she was having sex "the other day" when she looked down and saw the condom was no longer on her partner.  She asked her partner about it and they told her it may have come off inside of her.  She tried to remove the condom without success.  She denies fever, nausea/vomiting.  No abdominal pain, pelvic pain, vaginal discharge, or vaginal odor.  No vaginal bleeding.  Patient is currently [redacted] weeks pregnant.  Next OB/GYN checkup is in 6 days.  Per chart review, she is a high risk pregnancy due to gestational diabetes.    Past Medical History:  Diagnosis Date   Anxiety    Depression    HSV infection    Irregular bleeding 01/17/2013   Trichimoniasis 07/13/2021   07/13/21 treated with flagyl, POC.   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    Past Surgical History:  Procedure Laterality Date   CHOLECYSTECTOMY N/A 09/05/2012   Procedure: LAPAROSCOPIC CHOLECYSTECTOMY;  Surgeon: Emelia Loron, MD;  Location: [facility] OR;  Service: General;  Laterality: N/A;   ERCP N/A 09/04/2012   Procedure: ENDOSCOPIC RETROGRADE CHOLANGIOPANCREATOGRAPHY (ERCP);  Surgeon: Rachael Fee, MD;  Location: [facility] OR;  Service: Endoscopy;  Laterality: N/A;   PILONIDAL CYST EXCISION N/A 01/04/2018   Procedure: CYST EXCISION PILONIDAL SIMPLE;  Surgeon: Franky Macho, MD;  Location: [facility]S;  Service: General;  Laterality: N/A;  low back   WISDOM TOOTH EXTRACTION      OB History     Gravida  6   Para  5   Term  5   Preterm      AB      Living  5      SAB      IAB      Ectopic      Multiple  0   Live Births  5            Home Medications    Prior to Admission medications   Medication Sig Start Date End Date Taking? Authorizing Provider  Accu-Chek Softclix Lancets lancets Use as instructed to check blood sugar 4 times daily 11/24/22   Cheral Marker, CNM  aspirin EC 81 MG tablet Take 1 tablet (81 mg total) by mouth daily. Swallow whole. 11/16/22   Cheral Marker, CNM  Blood Glucose Monitoring Suppl (ACCU-CHEK GUIDE ME) w/Device KIT 1 each by Does not apply route 4 (four) times daily. 11/24/22   Cheral Marker, CNM  Doxylamine-Pyridoxine (DICLEGIS) 10-10 MG TBEC 2 tabs q hs, if sx persist add 1 tab q am on day 3, if sx persist add 1 tab q afternoon on day 4 11/16/22   Shawna Clamp R, CNM  glucose blood (ACCU-CHEK GUIDE) test strip Use as instructed to check blood sugar 4  times daily 11/24/22   Cheral Marker, CNM  metFORMIN (GLUCOPHAGE) 500 MG tablet Take 1 tablet (500 mg total) by mouth 2 (two) times daily with a meal. 12/14/22   Cheral Marker, CNM  ondansetron (ZOFRAN) 4 MG tablet Take 1 tablet (4 mg total) by mouth every 6 (six) hours. Patient not taking: Reported on 12/14/2022 10/15/22   Roemhildt, Lorin T, PA-C  Prenatal Vit-Fe Fumarate-FA (PRENATAL VITAMINS) 28-0.8 MG TABS TAKE 1 TABLET BY MOUTH EVERY DAY 11/23/22   Sue Lush, FNP  prenatal vitamin w/FE, FA (PRENATAL 1 + 1) 27-1 MG TABS tablet Take 1 tablet by mouth daily at 12 noon. Patient not taking: Reported on 12/14/2022 10/18/22   Adline Potter, NP  promethazine (PHENERGAN) 25 MG tablet Take 1 tablet (25 mg total) by mouth every 6 (six) hours as needed for nausea or vomiting. Patient not taking: Reported on 12/14/2022 09/30/22   Sue Lush, FNP    Family History Family History  Problem Relation Age of Onset   CAD Maternal Grandmother    Diabetes Maternal Grandmother    Hypertension Mother    Diabetes Mother    Breast cancer Maternal Aunt    Hypertension Maternal Grandfather    Stroke Maternal Grandfather     Social History Social History   Tobacco Use   Smoking status: Former    Current packs/day: 0.00    Types: Cigarettes, Cigars    Start date: 08/30/2002    Quit date: 08/29/2012    Years since quitting: 10.3   Smokeless tobacco: Never   Tobacco comments:    1-2 cigars daily  Vaping Use   Vaping status: Never Used  Substance Use Topics   Alcohol use: No   Drug use: Never     Allergies   Bactrim [sulfamethoxazole-trimethoprim]   Review of Systems Review of Systems Per HPI  Physical Exam Triage Vital Signs ED Triage Vitals [12/29/22 1229]  Encounter Vitals Group     BP 129/76     Systolic BP Percentile      Diastolic BP Percentile      Pulse Rate (!) 114     Resp 17     Temp 99.2 F (37.3 C)     Temp Source Oral     SpO2 95 %     Weight      Height      Head Circumference      Peak Flow      Pain Score 6     Pain Loc      Pain Education      Exclude from Growth Chart    No data found.  Updated Vital Signs BP 129/76 (BP Location: Right Arm)   Pulse (!) 114   Temp 99.2 F (37.3 C) (Oral)   Resp 17   LMP  (LMP Unknown)   SpO2 95%   Breastfeeding No   Visual Acuity Right Eye Distance:   Left Eye Distance:   Bilateral Distance:    Right Eye Near:   Left Eye Near:    Bilateral Near:     Physical Exam Vitals and nursing note reviewed. Exam conducted with a chaperone present ([place], [place]).  Constitutional:      General: She is not in acute distress.    Appearance: Normal appearance. She is not toxic-appearing.  HENT:     Head: Normocephalic and atraumatic.     Mouth/Throat:     Mouth: Mucous membranes are moist.  Pharynx: Oropharynx is clear.   Cardiovascular:     Rate and Rhythm: Normal rate.  Pulmonary:     Effort: Pulmonary effort is normal. No respiratory distress.  Genitourinary:    Exam position: Lithotomy position.     Pubic Area: No rash or pubic lice.      Labia:        Right: No rash, tenderness or lesion.        Left: No rash, tenderness or lesion.      Vagina: No signs of injury and foreign body. No vaginal discharge, tenderness or bleeding.     Comments: Vaginal canal palpated with no CMT, vaginal odor, obvious vaginal discharge Lymphadenopathy:     Lower Body: No right inguinal adenopathy. No left inguinal adenopathy.  Skin:    General: Skin is warm and dry.     Capillary Refill: Capillary refill takes less than 2 seconds.     Coloration: Skin is not jaundiced or pale.     Findings: No erythema.  Neurological:     Mental Status: She is alert and oriented to person, place, and time.      UC Treatments / Results  Labs (all labs ordered are listed, but only abnormal results are displayed) Labs Reviewed - No data to display  EKG   Radiology No results found.  Procedures Procedures (including critical care time)  Medications Ordered in UC Medications - No data to display  Initial Impression / Assessment and Plan / UC Course  I have reviewed the triage vital signs and the nursing notes.  Pertinent labs & imaging results that were available during my care of the patient were reviewed by me and considered in my medical decision making (see chart for details).   Patient is well-appearing, normotensive, afebrile, not tachypneic, oxygenating well on room air.  Patient is mildly tachycardic in triage today.    1. Vaginal symptom Discussed with patient that she will likely need follow-up with OB/GYN for further evaluation of possible foreign body Given her pregnancy, I am uncomfortable performing full pelvic exam today With a chaperone, I did an external vaginal exam and inserted my finger into the  vaginal canal without palpating an obvious foreign body Patient has no red flag signs or symptoms today, therefore do not think ER evaluation is warranted Strict ER precautions were discussed with the patient and I recommend she follow-up with OB/GYN as soon as possible  The patient was given the opportunity to ask questions.  All questions answered to their satisfaction.  The patient is in agreement to this plan.    Final Clinical Impressions(s) / UC Diagnoses   Final diagnoses:  Vaginal symptom     Discharge Instructions      Please call your OB provider to make an appointment to discuss your concerns.  I did not observe or feel a foreign body in your vagina today.     ED Prescriptions   None    PDMP not reviewed this encounter.   Valentino Nose, NP 12/29/22 1339

## 2022-12-30 ENCOUNTER — Ambulatory Visit: Payer: Medicaid Other | Admitting: Advanced Practice Midwife

## 2022-12-30 ENCOUNTER — Encounter: Payer: Self-pay | Admitting: Women's Health

## 2022-12-30 ENCOUNTER — Encounter: Payer: Self-pay | Admitting: Advanced Practice Midwife

## 2022-12-30 VITALS — BP 133/76 | HR 88 | Wt 201.0 lb

## 2022-12-30 DIAGNOSIS — Z3A18 18 weeks gestation of pregnancy: Secondary | ICD-10-CM

## 2022-12-30 DIAGNOSIS — O0992 Supervision of high risk pregnancy, unspecified, second trimester: Secondary | ICD-10-CM

## 2022-12-30 NOTE — Progress Notes (Signed)
   LOW-RISK PREGNANCY VISIT Patient name: NAZANIN PENNIX MRN [id number]  Date of birth: [birth year] Chief Complaint:   w/i possible retained condom  History of Present Illness:   MCKINSEY SHORB is a 37 y.o. G49P5005 female at [redacted]w[redacted]d with an Estimated Date of Delivery: 05/30/23 being seen today for possible retained condom. Reports that she was having sex "the other day" when she looked down and saw the condom was no longer on her partner. She asked her partner about it and they told her it may have come off inside of her. She tried to remove the condom without success. She denies fever, nausea/vomiting. No abdominal pain, pelvic pain, vaginal discharge, or vaginal odor. No vaginal bleeding  Seen in Urgent care 2 days ago, NP couldn't feel it but didn't want to put in a speculum "because she was pregnant".  No pain/odor.  Review of Systems:   Pertinent items are noted in HPI Denies abnormal vaginal discharge w/ itching/odor/irritation, headaches, visual changes, shortness of breath, chest pain, abdominal pain, severe nausea/vomiting, or problems with urination or bowel movements unless otherwise stated above. Pertinent History Reviewed:  Reviewed past medical,surgical, social, obstetrical and family history.  Reviewed problem list, medications and allergies. Physical Assessment:   Vitals:   12/30/22 1429  BP: 133/76  Pulse: 88  Weight: 201 lb (91.2 kg)  Body mass index is 32.44 kg/m.        Physical Examination:   General appearance: Well appearing, and in no distress  Mental status: Alert, oriented to person, place, and time  Skin: Warm & dry  Cardiovascular: Normal heart rate noted  Respiratory: Normal respiratory effort, no distress  Abdomen: Soft, gravid, nontender  Pelvic:  SSE:  nothing in vagina.  Digital exam reveals the same        Extremities: Edema: None Chaperone:  Auri, SNM   Fetal Status: Fetal Heart Rate (bpm): 148   Movement: Present      No results found for  this or any previous visit (from the past 24 hour(s)).  Assessment & Plan:    Pregnancy: [id number] at [redacted]w[redacted]d There are no diagnoses linked to this encounter.    Meds: No orders of the defined types were placed in this encounter.  Labs/procedures today:   Plan:  Continue routine obstetrical care  Next visit: prefers in person     Follow-up: Return for As scheduled.  Future Appointments  Date Time Provider Department Center  01/04/2023  2:15 PM [facility] [place] [facility] None  01/04/2023  3:10 PM Cheral Marker, CNM [facility]    No orders of the defined types were placed in this encounter.  Jacklyn Shell DNP, CNM 12/30/2022 2:57 PM

## 2023-01-04 ENCOUNTER — Ambulatory Visit (INDEPENDENT_AMBULATORY_CARE_PROVIDER_SITE_OTHER): Payer: Medicaid Other | Admitting: Women's Health

## 2023-01-04 ENCOUNTER — Other Ambulatory Visit (HOSPITAL_COMMUNITY)
Admission: RE | Admit: 2023-01-04 | Discharge: 2023-01-04 | Disposition: A | Discharge status: Home / Self Care | Payer: Medicaid Other | Source: Ambulatory Visit | Attending: Women's Health | Admitting: Women's Health

## 2023-01-04 ENCOUNTER — Encounter: Payer: Self-pay | Admitting: Women's Health

## 2023-01-04 ENCOUNTER — Ambulatory Visit: Payer: Medicaid Other

## 2023-01-04 VITALS — BP 116/68 | HR 79 | Wt 200.0 lb

## 2023-01-04 DIAGNOSIS — Z363 Encounter for antenatal screening for malformations: Secondary | ICD-10-CM

## 2023-01-04 DIAGNOSIS — O0992 Supervision of high risk pregnancy, unspecified, second trimester: Secondary | ICD-10-CM

## 2023-01-04 DIAGNOSIS — Z3A19 19 weeks gestation of pregnancy: Secondary | ICD-10-CM | POA: Diagnosis not present

## 2023-01-04 DIAGNOSIS — Z113 Encounter for screening for infections with a predominantly sexual mode of transmission: Secondary | ICD-10-CM

## 2023-01-04 DIAGNOSIS — O24419 Gestational diabetes mellitus in pregnancy, unspecified control: Secondary | ICD-10-CM

## 2023-01-04 DIAGNOSIS — O0990 Supervision of high risk pregnancy, unspecified, unspecified trimester: Secondary | ICD-10-CM

## 2023-01-04 NOTE — Progress Notes (Signed)
[place] 19+1 wks,breech,posterior placenta gr 0,normal ovaries,CX 4.7 cm,SVP of fluid 4.4 cm,left renal pelvic dilatation, LK  5.6 mm,RK WNL 3.4 mm,FHR 148 bpm,EFW 269 g 37%,anatomy complete

## 2023-01-04 NOTE — Progress Notes (Signed)
HIGH-RISK PREGNANCY VISIT Patient name: [name] MRN [id number]  Date of birth: [birth year] Chief Complaint:   Routine Prenatal Visit and Pregnancy Ultrasound (Want std testing)  History of Present Illness:   [name] is a 38 y.o. G41P5005 female at [redacted]w[redacted]d with an Estimated Date of Delivery: 05/30/23 being seen today for ongoing management of a high-risk pregnancy complicated by diabetes mellitus A2/BDM currently on metformin 500mg  BID .    Today she reports  fastings all <95, 2hr pp only 3 >120 (highest 128) . Requests full STD screen, no sx, but concerned she could have been exposed.  Contractions: Not present.  .  Movement: Present. denies leaking of fluid.      12/01/2022   11:17 AM 11/16/2022    3:20 PM 09/30/2022    9:09 AM 08/26/2021    2:54 PM 01/08/2019    4:03 PM  Depression screen PHQ 2/9  Decreased Interest 0 0 0 0 0  Down, Depressed, Hopeless 0 0 0 0 0  PHQ - 2 Score 0 0 0 0 0  Altered sleeping  0 0 0   Tired, decreased energy  0 0 0   Change in appetite  0 0 0   Feeling bad or failure about yourself   0 0 0   Trouble concentrating  0 0 0   Moving slowly or fidgety/restless  0 0 0   Suicidal thoughts  0 0 0   PHQ-9 Score  0 0 0         11/16/2022    3:20 PM 09/30/2022    9:09 AM 08/26/2021    2:55 PM  GAD 7 : Generalized Anxiety Score  Nervous, Anxious, on Edge 0 0 0  Control/stop worrying 0 0 0  Worry too much - different things 0 0 0  Trouble relaxing 0 0 0  Restless 0 0 0  Easily annoyed or irritable 0 0 0  Afraid - awful might happen 0 0 0  Total GAD 7 Score 0 0 0     Review of Systems:   Pertinent items are noted in HPI Denies abnormal vaginal discharge w/ itching/odor/irritation, headaches, visual changes, shortness of breath, chest pain, abdominal pain, severe nausea/vomiting, or problems with urination or bowel movements unless otherwise stated above. Pertinent History Reviewed:  Reviewed past medical,surgical, social, obstetrical and  family history.  Reviewed problem list, medications and allergies. Physical Assessment:   Vitals:   01/04/23 1506  BP: 116/68  Pulse: 79  Weight: 200 lb (90.7 kg)  Body mass index is 32.28 kg/m.           Physical Examination:   General appearance: alert, well appearing, and in no distress  Mental status: alert, oriented to person, place, and time  Skin: warm & dry   Extremities: Edema: None    Cardiovascular: normal heart rate noted  Respiratory: normal respiratory effort, no distress  Abdomen: gravid, soft, non-tender  Pelvic:  self-collected CV swab          Fetal Status:     Movement: Present    Fetal Surveillance Testing today: [place] 19+1 wks,breech,posterior placenta gr 0,normal ovaries,CX 4.7 cm,SVP of fluid 4.4 cm,left renal pelvic dilatation, LK  5.6 mm,RK WNL 3.4 mm,FHR 148 bpm,EFW 269 g 37%,anatomy complete    Chaperone: N/A    No results found for this or any previous visit (from the past 24 hour(s)).  Assessment & Plan:  High-risk pregnancy: [id number] at [redacted]w[redacted]d with an Estimated Date  of Delivery: 05/30/23   1) A2/BDM, stable on metformin 500mg  BID  2) STD screen per pt request  3) Fetal Lt RPD> discussed results and gave printed info  Meds: No orders of the defined types were placed in this encounter.   Labs/procedures today: CV swab, U/S, and HIV/RPR per request  Treatment Plan:  [place] 28, 32, 36w       2x/wk nst or weekly BPP @ 32wks    Deliver @ 39-39.6wks:_____   Reviewed: Preterm labor symptoms and general obstetric precautions including but not limited to vaginal bleeding, contractions, leaking of fluid and fetal movement were reviewed in detail with the patient.  All questions were answered. Does have home bp cuff. Office bp cuff given: not applicable. Check bp weekly, let us know if consistently >140 and/or >90.  Follow-up: Return in about 4 weeks (around 02/01/2023) for HROB, MD or CNM, in person.   No future appointments.  Orders Placed This Encounter   Procedures   HIV Antibody (routine testing w rflx)   RPR   Cheral Marker CNM, [facility] 01/04/2023 3:39 PM

## 2023-01-04 NOTE — Patient Instructions (Signed)
[name], thank you for choosing our office today! We appreciate the opportunity to meet your healthcare needs. You may receive a short survey by mail, e-mail, or through [employer]. If you are happy with your care we would appreciate if you could take just a few minutes to complete the survey questions. We read all of your comments and take your feedback very seriously. Thank you again for choosing our office.  Center for [employer] Team at [facility] at [facility] ([address] [place], [place] [address]) Entrance C, located off of [address] Free 24/7 valet parking  Go to [email or web address] to register for FREE online childbirth classes  Call the office [phone]) or go to [facility] ([facility]) if: You begin to severe cramping Your water breaks.  Sometimes it is a big gush of fluid, sometimes it is just a trickle that keeps getting your panties wet or running down your legs You have vaginal bleeding.  It is normal to have a small amount of spotting if your cervix was checked.   [facility] Pediatricians/Family Doctors [facility]): [address] Colette Ribas, [phone]           [facility]: [address], [phone]                [facility]): [address], [phone] (call to ask if accepting patients) [facility] Department: [address], [place], [phone]    [facility] Pediatricians/Family Doctors [facility]): [phone] [address], [phone] [facility]: [address] [place], [phone] [facility] [place]: [address], [phone]  [facility] Doctors  [facility]): [phone] [facility]: [address], [phone]   [facility] Doctors [facility]: [address], [phone]  [facility] Doctors  [facility]: [phone], [phone]  Home Blood Pressure Monitoring for Patients   Your provider has recommended that you check your blood pressure (BP) at least once a week at home. If you do not have a blood pressure cuff at home, one will be provided for you. Contact your provider if you have not received your monitor within 1 week.   Helpful Tips for Accurate Home Blood Pressure Checks  Don't smoke, exercise, or drink caffeine 30 minutes before checking your BP Use the restroom before checking your BP (a full bladder can raise your pressure) Relax in a comfortable upright chair Feet on the ground Left arm resting comfortably on a flat surface at the level of your heart Legs uncrossed Back supported Sit quietly and don't talk Place the cuff on your bare arm Adjust snuggly, so that only two fingertips can fit between your skin and the top of the cuff Check 2 readings separated by at least one minute Keep a log of your BP readings For a visual, please reference this diagram: http://ccnc.care/bpdiagram  Provider Name: [facility]     Phone: [phone]  Zone 1: ALL CLEAR  Continue to monitor your symptoms:  BP reading is less than 140 (top number) or less than 90 (bottom number)  No right upper stomach pain No headaches or seeing spots No feeling nauseated or throwing up No swelling in face and hands  Zone 2: CAUTION Call your doctor's office for any of the following:  BP reading is greater than 140 (top number) or greater than  90 (bottom number)  Stomach pain under your ribs in the middle or right side Headaches or seeing spots Feeling nauseated or throwing up Swelling in face and hands  Zone 3: EMERGENCY  Seek immediate medical care if you have any of the following:  BP reading is greater than160 (top number) or greater than 110 (bottom number) Severe headaches not improving with Tylenol Serious difficulty catching your breath Any worsening symptoms from  Zone 2     Second Trimester of Pregnancy The second trimester is from week 14 through week 27 (months 4 through 6). The second trimester is often a time when you feel your best. Your body has adjusted to being pregnant, and you begin to feel better physically. Usually, morning sickness has lessened or quit completely, you may have more energy, and you may have an increase in appetite. The second trimester is also a time when the fetus is growing rapidly. At the end of the sixth month, the fetus is about 9 inches long and weighs about 1 pounds. You will likely begin to feel the baby move (quickening) between 16 and 20 weeks of pregnancy. Body changes during your second trimester Your body continues to go through many changes during your second trimester. The changes vary from woman to woman. Your weight will continue to increase. You will notice your lower abdomen bulging out. You may begin to get stretch marks on your hips, abdomen, and breasts. You may develop headaches that can be relieved by medicines. The medicines should be approved by your health care provider. You may urinate more often because the fetus is pressing on your bladder. You may develop or continue to have heartburn as a result of your pregnancy. You may develop constipation because certain hormones are causing the muscles that push waste through your intestines to slow down. You may develop hemorrhoids or swollen, bulging veins (varicose veins). You may have back pain. This is caused by: Weight gain. Pregnancy hormones that are relaxing the joints in your pelvis. A shift in weight and the muscles that support your balance. Your breasts will continue to grow and they will continue to become tender. Your gums may bleed and may be sensitive to brushing and flossing. Dark spots or blotches (chloasma, mask of pregnancy) may develop on your face. This will likely fade after the baby is born. A dark line from your belly button to  the pubic area (linea nigra) may appear. This will likely fade after the baby is born. You may have changes in your hair. These can include thickening of your hair, rapid growth, and changes in texture. Some women also have hair loss during or after pregnancy, or hair that feels dry or thin. Your hair will most likely return to normal after your baby is born.  What to expect at prenatal visits During a routine prenatal visit: You will be weighed to make sure you and the fetus are growing normally. Your blood pressure will be taken. Your abdomen will be measured to track your baby's growth. The fetal heartbeat will be listened to. Any test results from the previous visit will be discussed.  Your health care provider may ask you: How you are feeling. If you are feeling the baby move. If you have had any abnormal symptoms, such as leaking fluid, bleeding, severe headaches, or abdominal cramping. If you are using any tobacco products, including cigarettes, chewing tobacco, and electronic cigarettes. If you have any questions.  Other tests that may be performed during  your second trimester include: Blood tests that check for: Low iron levels (anemia). High blood sugar that affects pregnant women (gestational diabetes) between 29 and 28 weeks. Rh antibodies. This is to check for a protein on red blood cells (Rh factor). Urine tests to check for infections, diabetes, or protein in the urine. An ultrasound to confirm the proper growth and development of the baby. An amniocentesis to check for possible genetic problems. Fetal screens for spina bifida and Down syndrome. HIV (human immunodeficiency virus) testing. Routine prenatal testing includes screening for HIV, unless you choose not to have this test.  Follow these instructions at home: Medicines Follow your health care provider's instructions regarding medicine use. Specific medicines may be either safe or unsafe to take during  pregnancy. Take a prenatal vitamin that contains at least 600 micrograms (mcg) of folic acid. If you develop constipation, try taking a stool softener if your health care provider approves. Eating and drinking Eat a balanced diet that includes fresh fruits and vegetables, whole grains, good sources of protein such as meat, eggs, or tofu, and low-fat dairy. Your health care provider will help you determine the amount of weight gain that is right for you. Avoid raw meat and uncooked cheese. These carry germs that can cause birth defects in the baby. If you have low calcium intake from food, talk to your health care provider about whether you should take a daily calcium supplement. Limit foods that are high in fat and processed sugars, such as fried and sweet foods. To prevent constipation: Drink enough fluid to keep your urine clear or pale yellow. Eat foods that are high in fiber, such as fresh fruits and vegetables, whole grains, and beans. Activity Exercise only as directed by your health care provider. Most women can continue their usual exercise routine during pregnancy. Try to exercise for 30 minutes at least 5 days a week. Stop exercising if you experience uterine contractions. Avoid heavy lifting, wear low heel shoes, and practice good posture. A sexual relationship may be continued unless your health care provider directs you otherwise. Relieving pain and discomfort Wear a good support bra to prevent discomfort from breast tenderness. Take warm sitz baths to soothe any pain or discomfort caused by hemorrhoids. Use hemorrhoid cream if your health care provider approves. Rest with your legs elevated if you have leg cramps or low back pain. If you develop varicose veins, wear support hose. Elevate your feet for 15 minutes, 3-4 times a day. Limit salt in your diet. Prenatal Care Write down your questions. Take them to your prenatal visits. Keep all your prenatal visits as told by your health  care provider. This is important. Safety Wear your seat belt at all times when driving. Make a list of emergency phone numbers, including numbers for family, friends, the hospital, and police and fire departments. General instructions Ask your health care provider for a referral to a local prenatal education class. Begin classes no later than the beginning of month 6 of your pregnancy. Ask for help if you have counseling or nutritional needs during pregnancy. Your health care provider can offer advice or refer you to specialists for help with various needs. Do not use hot tubs, steam rooms, or saunas. Do not douche or use tampons or scented sanitary pads. Do not cross your legs for long periods of time. Avoid cat litter boxes and soil used by cats. These carry germs that can cause birth defects in the baby and possibly loss of the  fetus by miscarriage or stillbirth. Avoid all smoking, herbs, alcohol, and unprescribed drugs. Chemicals in these products can affect the formation and growth of the baby. Do not use any products that contain nicotine or tobacco, such as cigarettes and e-cigarettes. If you need help quitting, ask your health care provider. Visit your dentist if you have not gone yet during your pregnancy. Use a soft toothbrush to brush your teeth and be gentle when you floss. Contact a health care provider if: You have dizziness. You have mild pelvic cramps, pelvic pressure, or nagging pain in the abdominal area. You have persistent nausea, vomiting, or diarrhea. You have a bad smelling vaginal discharge. You have pain when you urinate. Get help right away if: You have a fever. You are leaking fluid from your vagina. You have spotting or bleeding from your vagina. You have severe abdominal cramping or pain. You have rapid weight gain or weight loss. You have shortness of breath with chest pain. You notice sudden or extreme swelling of your face, hands, ankles, feet, or legs. You  have not felt your baby move in over an hour. You have severe headaches that do not go away when you take medicine. You have vision changes. Summary The second trimester is from week 14 through week 27 (months 4 through 6). It is also a time when the fetus is growing rapidly. Your body goes through many changes during pregnancy. The changes vary from woman to woman. Avoid all smoking, herbs, alcohol, and unprescribed drugs. These chemicals affect the formation and growth your baby. Do not use any tobacco products, such as cigarettes, chewing tobacco, and e-cigarettes. If you need help quitting, ask your health care provider. Contact your health care provider if you have any questions. Keep all prenatal visits as told by your health care provider. This is important. This information is not intended to replace advice given to you by your health care provider. Make sure you discuss any questions you have with your health care provider. Document Released: 02/09/2001 Document Revised: 07/24/2015 Document Reviewed: 04/18/2012 Elsevier Interactive Patient Education  2017 [employer].

## 2023-01-05 LAB — RPR: RPR Ser Ql: NONREACTIVE

## 2023-01-05 LAB — HIV ANTIBODY (ROUTINE TESTING W REFLEX): HIV Screen 4th Generation wRfx: NONREACTIVE

## 2023-01-06 LAB — CERVICOVAGINAL ANCILLARY ONLY
Bacterial Vaginitis (gardnerella): NEGATIVE
Candida Glabrata: NEGATIVE
Candida Vaginitis: POSITIVE — AB
Chlamydia: NEGATIVE
Comment: NEGATIVE
Comment: NEGATIVE
Comment: NEGATIVE
Comment: NEGATIVE
Comment: NEGATIVE
Comment: NORMAL
Neisseria Gonorrhea: NEGATIVE
Trichomonas: NEGATIVE

## 2023-01-06 MED ORDER — TERCONAZOLE 0.4 % VA CREA: 1.0000 | TOPICAL_CREAM | Freq: Every day | VAGINAL | 0 refills | Status: DC

## 2023-01-06 NOTE — Addendum Note (Signed)
Addended by: Shawna Clamp R on: 01/06/2023 02:03 PM   Modules accepted: Orders

## 2023-01-17 ENCOUNTER — Other Ambulatory Visit: Payer: Self-pay | Admitting: Women's Health

## 2023-01-17 MED ORDER — METRONIDAZOLE 500 MG PO TABS
500.0000 mg | ORAL_TABLET | Freq: Two times a day (BID) | ORAL | 0 refills | Status: DC
Start: 2023-01-17 — End: 2023-03-18

## 2023-02-01 ENCOUNTER — Other Ambulatory Visit (HOSPITAL_COMMUNITY)
Admission: RE | Admit: 2023-02-01 | Discharge: 2023-02-01 | Disposition: A | Discharge status: Home / Self Care | Payer: Medicaid Other | Source: Ambulatory Visit | Attending: Women's Health | Admitting: Women's Health

## 2023-02-01 ENCOUNTER — Ambulatory Visit (INDEPENDENT_AMBULATORY_CARE_PROVIDER_SITE_OTHER): Payer: Medicaid Other | Admitting: Women's Health

## 2023-02-01 ENCOUNTER — Encounter: Payer: Self-pay | Admitting: Women's Health

## 2023-02-01 VITALS — BP 114/66 | HR 88 | Wt 204.0 lb

## 2023-02-01 DIAGNOSIS — O24419 Gestational diabetes mellitus in pregnancy, unspecified control: Secondary | ICD-10-CM

## 2023-02-01 DIAGNOSIS — Z3A23 23 weeks gestation of pregnancy: Secondary | ICD-10-CM

## 2023-02-01 DIAGNOSIS — Z331 Pregnant state, incidental: Secondary | ICD-10-CM

## 2023-02-01 DIAGNOSIS — Z1389 Encounter for screening for other disorder: Secondary | ICD-10-CM

## 2023-02-01 DIAGNOSIS — O23592 Infection of other part of genital tract in pregnancy, second trimester: Secondary | ICD-10-CM

## 2023-02-01 DIAGNOSIS — N9089 Other specified noninflammatory disorders of vulva and perineum: Secondary | ICD-10-CM

## 2023-02-01 DIAGNOSIS — O0990 Supervision of high risk pregnancy, unspecified, unspecified trimester: Secondary | ICD-10-CM

## 2023-02-01 DIAGNOSIS — O0992 Supervision of high risk pregnancy, unspecified, second trimester: Secondary | ICD-10-CM | POA: Diagnosis not present

## 2023-02-01 DIAGNOSIS — L292 Pruritus vulvae: Secondary | ICD-10-CM | POA: Insufficient documentation

## 2023-02-01 LAB — POCT URINALYSIS DIPSTICK OB
Glucose, UA: NEGATIVE
Ketones, UA: NEGATIVE
Leukocytes, UA: NEGATIVE
Nitrite, UA: NEGATIVE
POC,PROTEIN,UA: NEGATIVE

## 2023-02-01 MED ORDER — PENICILLIN V POTASSIUM 500 MG PO TABS: 500.0000 mg | ORAL_TABLET | Freq: Four times a day (QID) | ORAL | 0 refills | Status: DC

## 2023-02-01 NOTE — Patient Instructions (Signed)
[name], thank you for choosing our office today! We appreciate the opportunity to meet your healthcare needs. You may receive a short survey by mail, e-mail, or through [employer]. If you are happy with your care we would appreciate if you could take just a few minutes to complete the survey questions. We read all of your comments and take your feedback very seriously. Thank you again for choosing our office.  Center for [employer] Team at [facility]  [facility] at [facility] ([address] [place], [place] [address]) Entrance C, located off of [address] Free 24/7 valet parking   CLASSES: Go to [email or web address] to register for classes (childbirth, breastfeeding, waterbirth, infant CPR, daddy bootcamp, etc.)  Call the office [phone]) or go to [facility] if: You begin to have strong, frequent contractions Your water breaks.  Sometimes it is a big gush of fluid, sometimes it is just a trickle that keeps getting your panties wet or running down your legs You have vaginal bleeding.  It is normal to have a small amount of spotting if your cervix was checked.  You don't feel your baby moving like normal.  If you don't, get you something to eat and drink and lay down and focus on feeling your baby move.   If your baby is still not moving like normal, you should call the office or go to [facility].  Call the office [phone]) or go to [facility] hospital for these signs of pre-eclampsia: Severe headache that does not go away with Tylenol Visual changes- seeing spots, double, blurred vision Pain under your right breast or upper abdomen that does not go away with Tums or heartburn medicine Nausea and/or vomiting Severe swelling in your hands, feet, and face    [place] Pediatricians/Family Doctors [facility]): [address] Colette Ribas, [phone]           [facility]: [address], [phone]                 [facility]): [address], [phone]  [facility] Department: [address], [place], [phone]    [facility] Pediatricians/Family Doctors [facility]): [address], [phone] [facility]: [address] [place], [phone] [facility]: [address], [phone]  [facility] Doctors  [facility]): [phone] [facility]: [address], [phone]   [facility] Doctors [facility]: [address], [phone]  [facility] Doctors  [facility]: [phone], [phone]  Home Blood Pressure Monitoring for Patients   Your provider has recommended that you check your blood pressure (BP) at least once a week at home. If you do not have a blood pressure cuff at home, one will be provided for you. Contact your provider if you have not received your monitor within 1 week.   Helpful Tips for Accurate Home Blood Pressure Checks  Don't smoke, exercise, or drink caffeine 30 minutes before checking your BP Use the restroom before checking your BP (a full bladder can raise your pressure) Relax in a comfortable upright chair Feet on the ground Left arm resting comfortably on a flat surface at the level of your heart Legs uncrossed Back supported Sit quietly and don't talk Place the cuff on your bare arm Adjust snuggly, so that only two fingertips can fit between your  skin and the top of the cuff Check 2 readings separated by at least one minute Keep a log of your BP readings For a visual, please reference this diagram: http://ccnc.care/bpdiagram  Provider Name: [facility]     Phone: [phone]  Zone 1: ALL CLEAR  Continue to monitor your symptoms:  BP reading is less than 140 (top number) or less than 90 (bottom number)  No right upper stomach pain No headaches or  seeing spots No feeling nauseated or throwing up No swelling in face and hands  Zone 2: CAUTION Call your doctor's office for any of the following:  BP reading is greater than 140 (top number) or greater than 90 (bottom number)  Stomach pain under your ribs in the middle or right side Headaches or seeing spots Feeling nauseated or throwing up Swelling in face and hands  Zone 3: EMERGENCY  Seek immediate medical care if you have any of the following:  BP reading is greater than160 (top number) or greater than 110 (bottom number) Severe headaches not improving with Tylenol Serious difficulty catching your breath Any worsening symptoms from Zone 2   Second Trimester of Pregnancy The second trimester is from week 13 through week 28, months 4 through 6. The second trimester is often a time when you feel your best. Your body has also adjusted to being pregnant, and you begin to feel better physically. Usually, morning sickness has lessened or quit completely, you may have more energy, and you may have an increase in appetite. The second trimester is also a time when the fetus is growing rapidly. At the end of the sixth month, the fetus is about 9 inches long and weighs about 1 pounds. You will likely begin to feel the baby move (quickening) between 18 and 20 weeks of the pregnancy. BODY CHANGES Your body goes through many changes during pregnancy. The changes vary from woman to woman.  Your weight will continue to increase. You will notice your lower abdomen bulging out. You may begin to get stretch marks on your hips, abdomen, and breasts. You may develop headaches that can be relieved by medicines approved by your health care provider. You may urinate more often because the fetus is pressing on your bladder. You may develop or continue to have heartburn as a result of your pregnancy. You may develop constipation because certain hormones are causing the muscles that push waste through your  intestines to slow down. You may develop hemorrhoids or swollen, bulging veins (varicose veins). You may have back pain because of the weight gain and pregnancy hormones relaxing your joints between the bones in your pelvis and as a result of a shift in weight and the muscles that support your balance. Your breasts will continue to grow and be tender. Your gums may bleed and may be sensitive to brushing and flossing. Dark spots or blotches (chloasma, mask of pregnancy) may develop on your face. This will likely fade after the baby is born. A dark line from your belly button to the pubic area (linea nigra) may appear. This will likely fade after the baby is born. You may have changes in your hair. These can include thickening of your hair, rapid growth, and changes in texture. Some women also have hair loss during or after pregnancy, or hair that feels dry or thin. Your hair will most likely return to normal after your baby is born. WHAT TO EXPECT AT YOUR PRENATAL VISITS During a routine prenatal visit: You will  be weighed to make sure you and the fetus are growing normally. Your blood pressure will be taken. Your abdomen will be measured to track your baby's growth. The fetal heartbeat will be listened to. Any test results from the previous visit will be discussed. Your health care provider may ask you: How you are feeling. If you are feeling the baby move. If you have had any abnormal symptoms, such as leaking fluid, bleeding, severe headaches, or abdominal cramping. If you have any questions. Other tests that may be performed during your second trimester include: Blood tests that check for: Low iron levels (anemia). Gestational diabetes (between 24 and 28 weeks). Rh antibodies. Urine tests to check for infections, diabetes, or protein in the urine. An ultrasound to confirm the proper growth and development of the baby. An amniocentesis to check for possible genetic problems. Fetal  screens for spina bifida and Down syndrome. HOME CARE INSTRUCTIONS  Avoid all smoking, herbs, alcohol, and unprescribed drugs. These chemicals affect the formation and growth of the baby. Follow your health care provider's instructions regarding medicine use. There are medicines that are either safe or unsafe to take during pregnancy. Exercise only as directed by your health care provider. Experiencing uterine cramps is a good sign to stop exercising. Continue to eat regular, healthy meals. Wear a good support bra for breast tenderness. Do not use hot tubs, steam rooms, or saunas. Wear your seat belt at all times when driving. Avoid raw meat, uncooked cheese, cat litter boxes, and soil used by cats. These carry germs that can cause birth defects in the baby. Take your prenatal vitamins. Try taking a stool softener (if your health care provider approves) if you develop constipation. Eat more high-fiber foods, such as fresh vegetables or fruit and whole grains. Drink plenty of fluids to keep your urine clear or pale yellow. Take warm sitz baths to soothe any pain or discomfort caused by hemorrhoids. Use hemorrhoid cream if your health care provider approves. If you develop varicose veins, wear support hose. Elevate your feet for 15 minutes, 3-4 times a day. Limit salt in your diet. Avoid heavy lifting, wear low heel shoes, and practice good posture. Rest with your legs elevated if you have leg cramps or low back pain. Visit your dentist if you have not gone yet during your pregnancy. Use a soft toothbrush to brush your teeth and be gentle when you floss. A sexual relationship may be continued unless your health care provider directs you otherwise. Continue to go to all your prenatal visits as directed by your health care provider. SEEK MEDICAL CARE IF:  You have dizziness. You have mild pelvic cramps, pelvic pressure, or nagging pain in the abdominal area. You have persistent nausea, vomiting, or  diarrhea. You have a bad smelling vaginal discharge. You have pain with urination. SEEK IMMEDIATE MEDICAL CARE IF:  You have a fever. You are leaking fluid from your vagina. You have spotting or bleeding from your vagina. You have severe abdominal cramping or pain. You have rapid weight gain or loss. You have shortness of breath with chest pain. You notice sudden or extreme swelling of your face, hands, ankles, feet, or legs. You have not felt your baby move in over an hour. You have severe headaches that do not go away with medicine. You have vision changes. Document Released: 02/09/2001 Document Revised: 02/20/2013 Document Reviewed: 04/18/2012 [facility] Patient Information 2015 [place], [place]. This information is not intended to replace advice given to you by your  health care provider. Make sure you discuss any questions you have with your health care provider.

## 2023-02-01 NOTE — Progress Notes (Signed)
HIGH-RISK PREGNANCY VISIT Patient name: [name] MRN [id number]  Date of birth: 1985/11/10 Chief Complaint:   Routine Prenatal Visit [facility] has abscess left side jaw, didn't know what kind antibotic give. Vaginal itching)  History of Present Illness:   [name] is a 37 y.o. G30P5005 female at [redacted]w[redacted]d with an Estimated Date of Delivery: 05/30/23 being seen today for ongoing management of a high-risk pregnancy complicated by diabetes mellitus A2/BDM currently on metformin 500mg  BID .    Today she reports  only 1 fasting elevated (96), 4 pp elevated (highest 128). Went to dentist today, has abscess, states dentist wasn't sure what antibiotic was safe w/ pregnancy and knew she had appt w/ [place] today, so didn't send rx . Vulvar itching, finished yeast cream about a week ago.  Contractions: Not present.  .  Movement: Present. denies leaking of fluid.      12/01/2022   11:17 AM 11/16/2022    3:20 PM 09/30/2022    9:09 AM 08/26/2021    2:54 PM 01/08/2019    4:03 PM  Depression screen PHQ 2/9  Decreased Interest 0 0 0 0 0  Down, Depressed, Hopeless 0 0 0 0 0  PHQ - 2 Score 0 0 0 0 0  Altered sleeping  0 0 0   Tired, decreased energy  0 0 0   Change in appetite  0 0 0   Feeling bad or failure about yourself   0 0 0   Trouble concentrating  0 0 0   Moving slowly or fidgety/restless  0 0 0   Suicidal thoughts  0 0 0   PHQ-9 Score  0 0 0         11/16/2022    3:20 PM 09/30/2022    9:09 AM 08/26/2021    2:55 PM  GAD 7 : Generalized Anxiety Score  Nervous, Anxious, on Edge 0 0 0  Control/stop worrying 0 0 0  Worry too much - different things 0 0 0  Trouble relaxing 0 0 0  Restless 0 0 0  Easily annoyed or irritable 0 0 0  Afraid - awful might happen 0 0 0  Total GAD 7 Score 0 0 0     Review of Systems:   Pertinent items are noted in HPI Denies abnormal vaginal discharge w/ itching/odor/irritation, headaches, visual changes, shortness of breath, chest pain, abdominal  pain, severe nausea/vomiting, or problems with urination or bowel movements unless otherwise stated above. Pertinent History Reviewed:  Reviewed past medical,surgical, social, obstetrical and family history.  Reviewed problem list, medications and allergies. Physical Assessment:   Vitals:   02/01/23 1611  BP: 114/66  Pulse: 88  Weight: 204 lb (92.5 kg)  Body mass index is 32.93 kg/m.           Physical Examination:   General appearance: alert, well appearing, and in no distress  Mental status: alert, oriented to person, place, and time  Skin: warm & dry   Extremities: Edema: None    Cardiovascular: normal heart rate noted  Respiratory: normal respiratory effort, no distress  Abdomen: gravid, soft, non-tender  Pelvic:  spec exam- cx visually closed, normal appearing d/c          Fetal Status: Fetal Heart Rate (bpm): 153 Fundal Height: 24 cm Movement: Present    Fetal Surveillance Testing today: doppler   Chaperone: Peggy Dones    No results found for this or any previous visit (from the past 24  hour(s)).  Assessment & Plan:  High-risk pregnancy: [id number] at [redacted]w[redacted]d with an Estimated Date of Delivery: 05/30/23   1) A2/BDM, stable on metformin 500mg  BID  2) Dental abscess, saw dentist today (see above note), rx PCN, call dentist back to get f/u appt  3) Vulvar itching> CV swab, eat yogurt (starting antibiotic)  Meds:  Meds ordered this encounter  Medications   penicillin v potassium (VEETID) 500 MG tablet    Sig: Take 1 tablet (500 mg total) by mouth 4 (four) times daily. X 7 days    Dispense:  28 tablet    Refill:  0    Labs/procedures today: spec exam and CV swab  Treatment Plan:  .ASA [x]   [place] 28, 32, 36wks    2x/wk NST or weekly bpp @ 32wks     Deliver @ 39-39.6wks:______   Reviewed: Preterm labor symptoms and general obstetric precautions including but not limited to vaginal bleeding, contractions, leaking of fluid and fetal movement were reviewed in detail with the  patient.  All questions were answered. Does have home bp cuff. Office bp cuff given: not applicable. Check bp weekly, let us know if consistently >140 and/or >90.  Follow-up: Return in about 4 weeks (around 03/01/2023) for HROB, US:EFW, PN2 (minus GTT) MD in person.   No future appointments.  Orders Placed This Encounter  Procedures   US OB Follow Up   Cheral Marker CNM, [facility] 02/01/2023 4:45 PM

## 2023-02-01 NOTE — Addendum Note (Signed)
Addended by: Federico Flake A on: 02/01/2023 04:53 PM   Modules accepted: Orders

## 2023-02-04 LAB — CERVICOVAGINAL ANCILLARY ONLY
Bacterial Vaginitis (gardnerella): NEGATIVE
Candida Glabrata: NEGATIVE
Candida Vaginitis: NEGATIVE
Chlamydia: NEGATIVE
Comment: NEGATIVE
Comment: NEGATIVE
Comment: NEGATIVE
Comment: NEGATIVE
Comment: NEGATIVE
Comment: NORMAL
Neisseria Gonorrhea: NEGATIVE
Trichomonas: NEGATIVE

## 2023-02-11 ENCOUNTER — Encounter: Payer: Self-pay | Admitting: Women's Health

## 2023-02-11 ENCOUNTER — Other Ambulatory Visit: Payer: Self-pay | Admitting: Obstetrics & Gynecology

## 2023-02-11 MED ORDER — CLOTRIMAZOLE 1 % VA CREA: 1.0000 | TOPICAL_CREAM | Freq: Every day | VAGINAL | 1 refills | Status: AC

## 2023-02-11 MED ORDER — TERCONAZOLE 0.4 % VA CREA: 1.0000 | TOPICAL_CREAM | Freq: Every day | VAGINAL | 3 refills | Status: DC

## 2023-02-11 NOTE — Progress Notes (Signed)
Rx for Sonia Addison, DO Attending Obstetrician & Gynecologist, [facility] for [employer], [facility]

## 2023-02-25 ENCOUNTER — Other Ambulatory Visit: Payer: Self-pay | Admitting: Women's Health

## 2023-02-25 DIAGNOSIS — O0990 Supervision of high risk pregnancy, unspecified, unspecified trimester: Secondary | ICD-10-CM

## 2023-02-25 DIAGNOSIS — L292 Pruritus vulvae: Secondary | ICD-10-CM

## 2023-02-25 DIAGNOSIS — Z331 Pregnant state, incidental: Secondary | ICD-10-CM

## 2023-02-25 DIAGNOSIS — O24419 Gestational diabetes mellitus in pregnancy, unspecified control: Secondary | ICD-10-CM

## 2023-02-25 DIAGNOSIS — Z3A23 23 weeks gestation of pregnancy: Secondary | ICD-10-CM

## 2023-02-25 DIAGNOSIS — O0992 Supervision of high risk pregnancy, unspecified, second trimester: Secondary | ICD-10-CM

## 2023-02-25 DIAGNOSIS — Z1389 Encounter for screening for other disorder: Secondary | ICD-10-CM

## 2023-03-01 ENCOUNTER — Ambulatory Visit: Payer: Medicaid Other | Admitting: Obstetrics & Gynecology

## 2023-03-01 ENCOUNTER — Other Ambulatory Visit: Payer: Medicaid Other

## 2023-03-01 ENCOUNTER — Ambulatory Visit: Payer: Medicaid Other | Admitting: Radiology

## 2023-03-01 ENCOUNTER — Encounter: Payer: Medicaid Other | Admitting: Obstetrics & Gynecology

## 2023-03-01 ENCOUNTER — Encounter: Payer: Self-pay | Admitting: Obstetrics & Gynecology

## 2023-03-01 VITALS — BP 118/71 | HR 88 | Wt 206.0 lb

## 2023-03-01 DIAGNOSIS — O0990 Supervision of high risk pregnancy, unspecified, unspecified trimester: Secondary | ICD-10-CM | POA: Diagnosis not present

## 2023-03-01 DIAGNOSIS — Z3A27 27 weeks gestation of pregnancy: Secondary | ICD-10-CM

## 2023-03-01 DIAGNOSIS — O24419 Gestational diabetes mellitus in pregnancy, unspecified control: Secondary | ICD-10-CM

## 2023-03-01 DIAGNOSIS — O0992 Supervision of high risk pregnancy, unspecified, second trimester: Secondary | ICD-10-CM

## 2023-03-01 MED ORDER — OMEPRAZOLE 20 MG PO CPDR
20.0000 mg | DELAYED_RELEASE_CAPSULE | Freq: Every day | ORAL | 6 refills | Status: DC
Start: 2023-03-01 — End: 2023-07-04

## 2023-03-01 NOTE — Progress Notes (Signed)
 GA 27+1 weeks Single active female fetus,  cephalic,  FHR = 862 bpm,  posterior pl, gr1,  AFI = 12.7 cm  27%,  SVP = 3.75 cm    EFW 26%   AC 21% The kidneys appear normal.  No evidence of renal pelviectasis. CL = 4.4 cm,   closed    nl ov's, neg adnexal regions

## 2023-03-01 NOTE — Progress Notes (Signed)
 HIGH-RISK PREGNANCY VISIT Patient name: [name] MRN [id number]  Date of birth: 1985/11/19 Chief Complaint:   Routine Prenatal Visit  History of Present Illness:   [name] is a 37 y.o. G82P5005 female at [redacted]w[redacted]d with an Estimated Date of Delivery: 05/30/23 being seen today for ongoing management of a high-risk pregnancy complicated by A2DM on metformin  500 mg BID.    Today she reports no complaints. Contractions: Not present. Vag. Bleeding: None.  Movement: Present. denies leaking of fluid.      12/01/2022   11:17 AM 11/16/2022    3:20 PM 09/30/2022    9:09 AM 08/26/2021    2:54 PM 01/08/2019    4:03 PM  Depression screen PHQ 2/9  Decreased Interest 0 0 0 0 0  Down, Depressed, Hopeless 0 0 0 0 0  PHQ - 2 Score 0 0 0 0 0  Altered sleeping  0 0 0   Tired, decreased energy  0 0 0   Change in appetite  0 0 0   Feeling bad or failure about yourself   0 0 0   Trouble concentrating  0 0 0   Moving slowly or fidgety/restless  0 0 0   Suicidal thoughts  0 0 0   PHQ-9 Score  0 0 0         11/16/2022    3:20 PM 09/30/2022    9:09 AM 08/26/2021    2:55 PM  GAD 7 : Generalized Anxiety Score  Nervous, Anxious, on Edge 0 0 0  Control/stop worrying 0 0 0  Worry too much - different things 0 0 0  Trouble relaxing 0 0 0  Restless 0 0 0  Easily annoyed or irritable 0 0 0  Afraid - awful might happen 0 0 0  Total GAD 7 Score 0 0 0     Review of Systems:   Pertinent items are noted in HPI Denies abnormal vaginal discharge w/ itching/odor/irritation, headaches, visual changes, shortness of breath, chest pain, abdominal pain, severe nausea/vomiting, or problems with urination or bowel movements unless otherwise stated above. Pertinent History Reviewed:  Reviewed past medical,surgical, social, obstetrical and family history.  Reviewed problem list, medications and allergies. Physical Assessment:   Vitals:   03/01/23 1446  BP: 118/71  Pulse: 88  Weight: 206 lb (93.4 kg)   Body mass index is 33.25 kg/m.           Physical Examination:   General appearance: alert, well appearing, and in no distress  Mental status: alert, oriented to person, place, and time  Skin: warm & dry   Extremities:      Cardiovascular: normal heart rate noted  Respiratory: normal respiratory effort, no distress  Abdomen: gravid, soft, non-tender  Pelvic: Cervical exam deferred         Fetal Status:     Movement: Present    Fetal Surveillance Testing today: sonogram EFW 26%   Chaperone:     No results found for this or any previous visit (from the past 24 hours).  Assessment & Plan:  High-risk pregnancy: [id number] at [redacted]w[redacted]d with an Estimated Date of Delivery: 05/30/23      ICD-10-CM   1. Supervision of high risk pregnancy, antepartum  O09.90 CBC    RPR    HIV Antibody (routine testing w rflx)    Antibody screen    2. [redacted] weeks gestation of pregnancy  Z3A.27 CBC    RPR    HIV Antibody (  routine testing w rflx)    Antibody screen    3. Gestational diabetes mellitus, class A2  O24.419        Meds:  Meds ordered this encounter  Medications   omeprazole  (PRILOSEC) 20 MG capsule    Sig: Take 1 capsule (20 mg total) by mouth daily. 1 tablet a day    Dispense:  30 capsule    Refill:  6    Orders:  Orders Placed This Encounter  Procedures   CBC   RPR   HIV Antibody (routine testing w rflx)   Antibody screen     Labs/procedures today: U/S  Treatment Plan:  start surveillance at 32 weeks    Follow-up: Return in about 3 weeks (around 03/22/2023) for HROB.   Future Appointments  Date Time Provider Department Center  03/01/2023  3:10 PM Jayne Vonn DEL, MD [facility]    Orders Placed This Encounter  Procedures   CBC   RPR   HIV Antibody (routine testing w rflx)   Antibody screen   Vonn DEL Jayne  Attending Physician for the Center for [facility] 03/01/2023 3:08 PM

## 2023-03-02 LAB — CBC
Hematocrit: 35.3 % (ref 34.0–46.6)
Hemoglobin: 11.6 g/dL (ref 11.1–15.9)
MCH: 30.5 pg (ref 26.6–33.0)
MCHC: 32.9 g/dL (ref 31.5–35.7)
MCV: 93 fL (ref 79–97)
Platelets: 257 10*3/uL (ref 150–450)
RBC: 3.8 x10E6/uL (ref 3.77–5.28)
RDW: 13.1 % (ref 11.7–15.4)
WBC: 10.7 10*3/uL (ref 3.4–10.8)

## 2023-03-02 LAB — RPR: RPR Ser Ql: NONREACTIVE

## 2023-03-02 LAB — ANTIBODY SCREEN: Antibody Screen: NEGATIVE

## 2023-03-02 LAB — HIV ANTIBODY (ROUTINE TESTING W REFLEX): HIV Screen 4th Generation wRfx: NONREACTIVE
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
## 2023-03-07 DIAGNOSIS — H5213 Myopia, bilateral: Secondary | ICD-10-CM | POA: Diagnosis not present

## 2023-03-17 ENCOUNTER — Emergency Department (HOSPITAL_COMMUNITY): Payer: Medicaid Other

## 2023-03-17 ENCOUNTER — Other Ambulatory Visit: Payer: Self-pay

## 2023-03-17 ENCOUNTER — Inpatient Hospital Stay (HOSPITAL_COMMUNITY): Payer: Medicaid Other

## 2023-03-17 ENCOUNTER — Observation Stay (HOSPITAL_COMMUNITY)
Admission: EM | Admit: 2023-03-17 | Discharge: 2023-03-18 | Disposition: A | Discharge status: Home / Self Care | Payer: Medicaid Other | Attending: Obstetrics and Gynecology | Admitting: Obstetrics and Gynecology

## 2023-03-17 ENCOUNTER — Encounter (HOSPITAL_COMMUNITY): Payer: Self-pay | Admitting: Emergency Medicine

## 2023-03-17 ENCOUNTER — Emergency Department (HOSPITAL_COMMUNITY): Payer: No Typology Code available for payment source

## 2023-03-17 DIAGNOSIS — M542 Cervicalgia: Secondary | ICD-10-CM | POA: Diagnosis not present

## 2023-03-17 DIAGNOSIS — Z3A29 29 weeks gestation of pregnancy: Secondary | ICD-10-CM | POA: Diagnosis not present

## 2023-03-17 DIAGNOSIS — Z87891 Personal history of nicotine dependence: Secondary | ICD-10-CM | POA: Insufficient documentation

## 2023-03-17 DIAGNOSIS — R519 Headache, unspecified: Secondary | ICD-10-CM | POA: Diagnosis not present

## 2023-03-17 DIAGNOSIS — R1084 Generalized abdominal pain: Secondary | ICD-10-CM | POA: Diagnosis not present

## 2023-03-17 DIAGNOSIS — O9A213 Injury, poisoning and certain other consequences of external causes complicating pregnancy, third trimester: Secondary | ICD-10-CM | POA: Diagnosis not present

## 2023-03-17 DIAGNOSIS — O24424 Gestational diabetes mellitus in childbirth, insulin controlled: Secondary | ICD-10-CM

## 2023-03-17 DIAGNOSIS — Z79899 Other long term (current) drug therapy: Secondary | ICD-10-CM | POA: Insufficient documentation

## 2023-03-17 DIAGNOSIS — O24410 Gestational diabetes mellitus in pregnancy, diet controlled: Principal | ICD-10-CM | POA: Insufficient documentation

## 2023-03-17 DIAGNOSIS — R4182 Altered mental status, unspecified: Secondary | ICD-10-CM | POA: Diagnosis not present

## 2023-03-17 DIAGNOSIS — Z7901 Long term (current) use of anticoagulants: Secondary | ICD-10-CM | POA: Insufficient documentation

## 2023-03-17 DIAGNOSIS — S299XXA Unspecified injury of thorax, initial encounter: Secondary | ICD-10-CM | POA: Diagnosis not present

## 2023-03-17 LAB — CBC
HCT: 35 % — ABNORMAL LOW (ref 36.0–46.0)
Hemoglobin: 11.4 g/dL — ABNORMAL LOW (ref 12.0–15.0)
MCH: 30.3 pg (ref 26.0–34.0)
MCHC: 32.6 g/dL (ref 30.0–36.0)
MCV: 93.1 fL (ref 80.0–100.0)
Platelets: 224 10*3/uL (ref 150–400)
RBC: 3.76 MIL/uL — ABNORMAL LOW (ref 3.87–5.11)
RDW: 13.2 % (ref 11.5–15.5)
WBC: 11.1 10*3/uL — ABNORMAL HIGH (ref 4.0–10.5)
nRBC: 0 % (ref 0.0–0.2)

## 2023-03-17 LAB — KLEIHAUER-BETKE STAIN
Fetal Cells %: 0.45 %
Quantitation Fetal Hemoglobin: 0.0042 mL

## 2023-03-17 LAB — TYPE AND SCREEN
ABO/RH(D): A POS
Antibody Screen: NEGATIVE

## 2023-03-17 LAB — GLUCOSE, CAPILLARY
Glucose-Capillary: 120 mg/dL — ABNORMAL HIGH (ref 70–99)
Glucose-Capillary: 108 mg/dL — ABNORMAL HIGH (ref 70–99)

## 2023-03-17 MED ORDER — CYCLOBENZAPRINE HCL 10 MG PO TABS
5.0000 mg | ORAL_TABLET | Freq: Three times a day (TID) | ORAL | Status: DC | PRN
  Administered 2023-03-17: 5 mg via ORAL
  Filled 2023-03-17: qty 1

## 2023-03-17 MED ORDER — SODIUM CHLORIDE 0.9% FLUSH: 3.0000 mL | INTRAVENOUS | Status: DC | PRN

## 2023-03-17 MED ORDER — ACETAMINOPHEN 500 MG PO TABS
1000.0000 mg | ORAL_TABLET | Freq: Once | ORAL | Status: AC
  Administered 2023-03-17: 1000 mg via ORAL
  Filled 2023-03-17: qty 2

## 2023-03-17 MED ORDER — METFORMIN HCL 500 MG PO TABS
500.0000 mg | ORAL_TABLET | Freq: Two times a day (BID) | ORAL | Status: DC
  Administered 2023-03-17 – 2023-03-18 (×3): 500 mg via ORAL
  Filled 2023-03-17 (×3): qty 1

## 2023-03-17 MED ORDER — CALCIUM CARBONATE ANTACID 500 MG PO CHEW: 2.0000 | CHEWABLE_TABLET | ORAL | Status: DC | PRN

## 2023-03-17 MED ORDER — SODIUM CHLORIDE 0.9% FLUSH
3.0000 mL | Freq: Two times a day (BID) | INTRAVENOUS | Status: DC
  Administered 2023-03-17: 10 mL via INTRAVENOUS
  Administered 2023-03-17: 3 mL via INTRAVENOUS

## 2023-03-17 MED ORDER — PRENATAL MULTIVITAMIN CH
1.0000 | ORAL_TABLET | Freq: Every day | ORAL | Status: DC
  Administered 2023-03-17: 1 via ORAL
  Filled 2023-03-17: qty 1

## 2023-03-17 MED ORDER — ACETAMINOPHEN 325 MG PO TABS
650.0000 mg | ORAL_TABLET | ORAL | Status: DC | PRN
  Administered 2023-03-17 – 2023-03-18 (×2): 650 mg via ORAL
  Filled 2023-03-17 (×2): qty 2
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
## 2023-03-17 NOTE — ED Notes (Signed)
Patient aunt called name chandy wanting her to call

## 2023-03-17 NOTE — Progress Notes (Signed)
Dr Vergie Living made aware of patient and informed of scans being performed.  MD gives lab orders and says that he would like for pt to be observed for 23 hrs. once she is cleared medically.  Dr Vergie Living says he will call [facility] to make them aware.

## 2023-03-18 DIAGNOSIS — Z3A29 29 weeks gestation of pregnancy: Secondary | ICD-10-CM | POA: Diagnosis not present

## 2023-03-18 DIAGNOSIS — O24424 Gestational diabetes mellitus in childbirth, insulin controlled: Secondary | ICD-10-CM | POA: Diagnosis not present

## 2023-03-18 LAB — GLUCOSE, CAPILLARY: Glucose-Capillary: 77 mg/dL (ref 70–99)

## 2023-03-18 NOTE — Plan of Care (Signed)

## 2023-03-18 NOTE — Plan of Care (Signed)
  Problem: Education: Goal: Knowledge of General Education information will improve Description: Including pain rating scale, medication(s)/side effects and non-pharmacologic comfort measures Outcome: Adequate for Discharge   Problem: Health Behavior/Discharge Planning: Goal: Ability to manage health-related needs will improve Outcome: Adequate for Discharge   Problem: Clinical Measurements: Goal: Ability to maintain clinical measurements within normal limits will improve Outcome: Adequate for Discharge Goal: Will remain free from infection Outcome: Adequate for Discharge Goal: Diagnostic test results will improve Outcome: Adequate for Discharge Goal: Respiratory complications will improve Outcome: Adequate for Discharge Goal: Cardiovascular complication will be avoided Outcome: Adequate for Discharge   Problem: Activity: Goal: Risk for activity intolerance will decrease Outcome: Adequate for Discharge   Problem: Nutrition: Goal: Adequate nutrition will be maintained Outcome: Adequate for Discharge   Problem: Coping: Goal: Level of anxiety will decrease Outcome: Adequate for Discharge   Problem: Elimination: Goal: Will not experience complications related to bowel motility Outcome: Adequate for Discharge Goal: Will not experience complications related to urinary retention Outcome: Adequate for Discharge   Problem: Pain Managment: Goal: General experience of comfort will improve and/or be controlled Outcome: Adequate for Discharge   Problem: Safety: Goal: Ability to remain free from injury will improve Outcome: Adequate for Discharge   Problem: Skin Integrity: Goal: Risk for impaired skin integrity will decrease Outcome: Adequate for Discharge   Problem: Education: Goal: Ability to describe self-care measures that may prevent or decrease complications (Diabetes Survival Skills Education) will improve Outcome: Adequate for Discharge Goal: Individualized Educational  Video(s) Outcome: Adequate for Discharge   Problem: Coping: Goal: Ability to adjust to condition or change in health will improve Outcome: Adequate for Discharge   Problem: Fluid Volume: Goal: Ability to maintain a balanced intake and output will improve Outcome: Adequate for Discharge   Problem: Health Behavior/Discharge Planning: Goal: Ability to identify and utilize available resources and services will improve Outcome: Adequate for Discharge Goal: Ability to manage health-related needs will improve Outcome: Adequate for Discharge   Problem: Metabolic: Goal: Ability to maintain appropriate glucose levels will improve Outcome: Adequate for Discharge   Problem: Nutritional: Goal: Maintenance of adequate nutrition will improve Outcome: Adequate for Discharge Goal: Progress toward achieving an optimal weight will improve Outcome: Adequate for Discharge   Problem: Skin Integrity: Goal: Risk for impaired skin integrity will decrease Outcome: Adequate for Discharge   Problem: Tissue Perfusion: Goal: Adequacy of tissue perfusion will improve Outcome: Adequate for Discharge   Problem: Education: Goal: Knowledge of disease or condition will improve Outcome: Adequate for Discharge Goal: Knowledge of the prescribed therapeutic regimen will improve Outcome: Adequate for Discharge Goal: Individualized Educational Video(s) Outcome: Adequate for Discharge   Problem: Clinical Measurements: Goal: Complications related to the disease process, condition or treatment will be avoided or minimized Outcome: Adequate for Discharge

## 2023-03-18 NOTE — Progress Notes (Addendum)
   03/18/23 0945  Departure Condition  Departure Condition Good  Mobility at [facility]  Patient/Caregiver Teaching Teach Back Method Used;Discharge instructions reviewed;Follow-up care reviewed;Pain management discussed;Patient/caregiver verbalized understanding;Medications discussed  Departure Mode With family  Was procedural sedation performed on this patient during this visit? No   Patient alert and oriented x4, VS and pain stable at time of discharge.   Addendum: Second reviewer: Barbaraann Rondo, RN
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
## 2023-03-22 ENCOUNTER — Encounter: Payer: Medicaid Other | Admitting: Obstetrics & Gynecology

## 2023-03-23 ENCOUNTER — Ambulatory Visit (INDEPENDENT_AMBULATORY_CARE_PROVIDER_SITE_OTHER): Payer: Medicaid Other | Admitting: Obstetrics & Gynecology

## 2023-03-23 ENCOUNTER — Other Ambulatory Visit (HOSPITAL_COMMUNITY)
Admission: RE | Admit: 2023-03-23 | Discharge: 2023-03-23 | Disposition: A | Discharge status: Home / Self Care | Payer: Medicaid Other | Source: Ambulatory Visit | Attending: Obstetrics & Gynecology | Admitting: Obstetrics & Gynecology

## 2023-03-23 VITALS — BP 132/75 | HR 93 | Wt 205.2 lb

## 2023-03-23 DIAGNOSIS — O09893 Supervision of other high risk pregnancies, third trimester: Secondary | ICD-10-CM

## 2023-03-23 DIAGNOSIS — Z3A30 30 weeks gestation of pregnancy: Secondary | ICD-10-CM

## 2023-03-23 DIAGNOSIS — Z23 Encounter for immunization: Secondary | ICD-10-CM

## 2023-03-23 DIAGNOSIS — N898 Other specified noninflammatory disorders of vagina: Secondary | ICD-10-CM | POA: Diagnosis not present

## 2023-03-23 DIAGNOSIS — O0993 Supervision of high risk pregnancy, unspecified, third trimester: Secondary | ICD-10-CM

## 2023-03-23 DIAGNOSIS — O09899 Supervision of other high risk pregnancies, unspecified trimester: Secondary | ICD-10-CM | POA: Diagnosis not present

## 2023-03-23 DIAGNOSIS — O24419 Gestational diabetes mellitus in pregnancy, unspecified control: Secondary | ICD-10-CM

## 2023-03-23 DIAGNOSIS — O0990 Supervision of high risk pregnancy, unspecified, unspecified trimester: Secondary | ICD-10-CM

## 2023-03-23 NOTE — Progress Notes (Signed)
HIGH-RISK PREGNANCY VISIT Patient name: [name] MRN [id number]  Date of birth: 1985/12/09 Chief Complaint:   Routine Prenatal Visit  History of Present Illness:   [name] is a 38 y.o. 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  Denies odor.  Today she reports no complaints.   Contractions: Not present. Vag. Bleeding: None.  Movement: Present. denies leaking of fluid.      12/01/2022   11:17 AM 11/16/2022    3:20 PM 09/30/2022    9:09 AM 08/26/2021    2:54 PM 01/08/2019    4:03 PM  Depression screen PHQ 2/9  Decreased Interest 0 0 0 0 0  Down, Depressed, Hopeless 0 0 0 0 0  PHQ - 2 Score 0 0 0 0 0  Altered sleeping  0 0 0   Tired, decreased energy  0 0 0   Change in appetite  0 0 0   Feeling bad or failure about yourself   0 0 0   Trouble concentrating  0 0 0   Moving slowly or fidgety/restless  0 0 0   Suicidal thoughts  0 0 0   PHQ-9 Score  0 0 0      Current Outpatient Medications  Medication Instructions   Accu-Chek Softclix Lancets lancets Use as instructed to check blood sugar 4 times daily   aspirin EC 81 mg, Oral, Daily, Swallow whole.   Blood Glucose Monitoring Suppl (ACCU-CHEK GUIDE ME) w/Device KIT 1 each, Does not apply, 4 times daily   Doxylamine-Pyridoxine (DICLEGIS) 10-10 MG TBEC 2 tabs q hs, if sx persist add 1 tab q am on day 3, if sx persist add 1 tab q afternoon on day 4   glucose blood (ACCU-CHEK GUIDE) test strip Use as instructed to check blood sugar 4 times daily   metFORMIN (GLUCOPHAGE) 500 mg, Oral, 2 times daily with meals   omeprazole (PRILOSEC) 20 mg, Oral, Daily, 1 tablet a day    ondansetron (ZOFRAN) 4 mg, Oral, Every 6 hours   Prenatal Vit-Fe Fumarate-FA (PRENATAL VITAMINS) 28-0.8 MG TABS 1 tablet, Oral, Daily   prenatal vitamin w/FE, FA (PRENATAL 1 + 1) 27-1 MG TABS tablet 1 tablet, Oral, Daily   promethazine (PHENERGAN) 25 mg, Oral, Every 6 hours PRN     Review of Systems:   Pertinent items are noted in HPI Denies abnormal vaginal discharge w/ itching/odor/irritation, headaches, visual changes, shortness of breath, chest pain, abdominal pain, severe nausea/vomiting, or problems with urination or bowel movements unless otherwise stated above. Pertinent History Reviewed:  Reviewed past medical,surgical, social, obstetrical and family history.  Reviewed problem list, medications and allergies. Physical Assessment:   Vitals:   03/23/23 1559  BP: 132/75  Pulse: 93  Weight: 205 lb 3.2 oz (93.1 kg)  Body mass index is 35.22 kg/m.           Physical Examination:   General appearance: alert, well appearing, and in no distress  Mental status: normal mood, behavior, speech, dress, motor activity, and thought processes  Skin: warm & dry   Extremities:      Cardiovascular: normal heart rate noted  Respiratory:  normal respiratory effort, no distress  Abdomen: gravid, soft, non-tender  Pelvic: Cervical exam deferred         Fetal Status: Fetal Heart Rate (bpm): 155 Fundal Height: 32 cm Movement: Present    Fetal Surveillance Testing today: doppler   Chaperone: N/A    No results found for this or any previous visit (from the past 24 hours).   Assessment & Plan:  High-risk pregnancy: [id number] at [redacted]w[redacted]d with an Estimated Date of Delivery: 05/30/23   1) GDMA2 -per pt controlled with MTF -plan to schedule growth and start antepartum testing in 2 wks -discussed if well controlled IOL ~ 39wk  2) HSV Asymptomatic, reviewed suppression @ 36wk  Meds: No orders of the defined types were placed in this encounter.   Labs/procedures today: Tdap today  Treatment  Plan:  routine OB care and as outlined above  Reviewed: Preterm labor symptoms and general obstetric precautions including but not limited to vaginal bleeding, contractions, leaking of fluid and fetal movement were reviewed in detail with the patient.  All questions were answered. Pt has home bp cuff. Check bp weekly, let us know if >140/90.   Follow-up: Return for in 2 weeks start BPP/NST twice weekly and growth every 4wks .   Future Appointments  Date Time Provider Department Center  04/06/2023  3:00 PM [facility] - FTOBGYN [place] [facility] None  04/06/2023  3:50 PM [facility]FT[facility] NURSE [facility]  04/06/2023  4:10 PM Myna Hidalgo, DO [facility]  04/12/2023  3:00 PM [facility] - [facility]TO[facility]YN [place] [facility] None  04/12/2023  3:50 PM [facility]FT[facility] NURSE [facility]  04/12/2023  4:10 PM Eure, Amaryllis Dyke, MD [facility]TO[facility]YN    Orders Placed This Encounter  Procedures   US FETAL BPP WO NON STRESS   Tdap vaccine greater than or equal to 7yo IM    Myna Hidalgo, DO Attending Obstetrician & Gynecologist, [facility] for [employer], [facility]

## 2023-03-24 LAB — CERVICOVAGINAL ANCILLARY ONLY
Bacterial Vaginitis (gardnerella): NEGATIVE
Candida Glabrata: NEGATIVE
Candida Vaginitis: NEGATIVE
Comment: NEGATIVE
Comment: NEGATIVE
Comment: NEGATIVE

## 2023-03-26 ENCOUNTER — Encounter: Payer: Self-pay | Admitting: Obstetrics & Gynecology

## 2023-04-06 ENCOUNTER — Encounter: Payer: Self-pay | Admitting: Obstetrics & Gynecology

## 2023-04-06 ENCOUNTER — Ambulatory Visit: Payer: Medicaid Other | Admitting: Obstetrics & Gynecology

## 2023-04-06 ENCOUNTER — Other Ambulatory Visit (HOSPITAL_COMMUNITY)
Admission: RE | Admit: 2023-04-06 | Discharge: 2023-04-06 | Disposition: A | Discharge status: Home / Self Care | Payer: Medicaid Other | Source: Ambulatory Visit | Attending: Obstetrics & Gynecology | Admitting: Obstetrics & Gynecology

## 2023-04-06 ENCOUNTER — Other Ambulatory Visit: Payer: Medicaid Other

## 2023-04-06 ENCOUNTER — Ambulatory Visit: Payer: Medicaid Other

## 2023-04-06 VITALS — BP 135/76 | HR 93 | Wt 204.2 lb

## 2023-04-06 DIAGNOSIS — O0993 Supervision of high risk pregnancy, unspecified, third trimester: Secondary | ICD-10-CM

## 2023-04-06 DIAGNOSIS — Z3A32 32 weeks gestation of pregnancy: Secondary | ICD-10-CM | POA: Diagnosis not present

## 2023-04-06 DIAGNOSIS — O24419 Gestational diabetes mellitus in pregnancy, unspecified control: Secondary | ICD-10-CM

## 2023-04-06 DIAGNOSIS — K047 Periapical abscess without sinus: Secondary | ICD-10-CM | POA: Diagnosis not present

## 2023-04-06 DIAGNOSIS — Z113 Encounter for screening for infections with a predominantly sexual mode of transmission: Secondary | ICD-10-CM | POA: Diagnosis not present

## 2023-04-06 DIAGNOSIS — O0992 Supervision of high risk pregnancy, unspecified, second trimester: Secondary | ICD-10-CM

## 2023-04-06 NOTE — Progress Notes (Signed)
 HIGH-RISK PREGNANCY VISIT Patient name: [name] MRN [id number]  Date of birth: 01/05/1986 Chief Complaint:   Routine Prenatal Visit  History of Present Illness:   [name] is a 38 y.o. G57P5005 female at [redacted]w[redacted]d with an Estimated Date of Delivery: 05/30/23 being seen today for ongoing management of a high-risk pregnancy complicated by:  -GDMA2 Well controlled with MTF  -h/o HSV- asymptomatic  Today she reports no complaints.   Contractions: Not present. Vag. Bleeding: None.  Movement: Present. denies leaking of fluid.      12/01/2022   11:17 AM 11/16/2022    3:20 PM 09/30/2022    9:09 AM 08/26/2021    2:54 PM 01/08/2019    4:03 PM  Depression screen PHQ 2/9  Decreased Interest 0 0 0 0 0  Down, Depressed, Hopeless 0 0 0 0 0  PHQ - 2 Score 0 0 0 0 0  Altered sleeping  0 0 0   Tired, decreased energy  0 0 0   Change in appetite  0 0 0   Feeling bad or failure about yourself   0 0 0   Trouble concentrating  0 0 0   Moving slowly or fidgety/restless  0 0 0   Suicidal thoughts  0 0 0   PHQ-9 Score  0 0 0      Current Outpatient Medications  Medication Instructions   Accu-Chek Softclix Lancets lancets Use as instructed to check blood sugar 4 times daily   aspirin  EC 81 mg, Oral, Daily, Swallow whole.   Blood Glucose Monitoring Suppl (ACCU-CHEK GUIDE ME) w/Device KIT 1 each, Does not apply, 4 times daily   Doxylamine -Pyridoxine  (DICLEGIS ) 10-10 MG TBEC 2 tabs q hs, if sx persist add 1 tab q am on day 3, if sx persist add 1 tab q afternoon on day 4   glucose blood (ACCU-CHEK GUIDE) test strip Use as instructed to check blood sugar 4 times daily   metFORMIN  (GLUCOPHAGE ) 500 mg, Oral, 2 times daily with meals   omeprazole  (PRILOSEC) 20 mg, Oral, Daily, 1 tablet a day   ondansetron  (ZOFRAN ) 4 mg, Oral, Every 6 hours   Prenatal Vit-Fe Fumarate-FA (PRENATAL VITAMINS) 28-0.8 MG TABS 1 tablet, Oral, Daily   prenatal vitamin w/FE, FA (PRENATAL 1 + 1) 27-1 MG TABS tablet  1 tablet, Oral, Daily   promethazine  (PHENERGAN ) 25 mg, Oral, Every 6 hours PRN     Review of Systems:   Pertinent items are noted in HPI Denies abnormal vaginal discharge w/ itching/odor/irritation, headaches, visual changes, shortness of breath, chest pain, abdominal pain, severe nausea/vomiting, or problems with urination or bowel movements unless otherwise stated above. Pertinent History Reviewed:  Reviewed past medical,surgical, social, obstetrical and family history.  Reviewed problem list, medications and allergies. Physical Assessment:   Vitals:   04/06/23 1543  BP: 135/76  Pulse: 93  Weight: 204 lb 3.2 oz (92.6 kg)  Body mass index is 35.05 kg/m.           Physical Examination:   General appearance: alert, well appearing, and in no distress  Mental status: normal mood, behavior, speech, dress, motor activity, and thought processes  Skin: warm & dry   Extremities:      Cardiovascular: normal heart rate noted  Respiratory: normal respiratory effort, no distress  Abdomen: gravid, soft, non-tender  Pelvic: Cervical exam deferred         Fetal Status:     Movement: Present    Fetal Surveillance Testing  today: breech,cx 3.2 cm,posterior placenta gr 0,AFI 18 cm,BPP 8/8,EFW 1853 g 27%    Chaperone: N/A    No results found for this or any previous visit (from the past 24 hours).   Assessment & Plan:  High-risk pregnancy: G6P5005 at [redacted]w[redacted]d with an Estimated Date of Delivery: 05/30/23   1) GDMA2 Well controlled Normal growth today, BPP 8/8 Continue antepartum testing  2) HSV -plan to start prophylaxis ~ 34-35wk  Meds: No orders of the defined types were placed in this encounter.   Labs/procedures today: BPP, growth  Treatment Plan:  routine OB care and as outlined above  Reviewed: Preterm labor symptoms and general obstetric precautions including but not limited to vaginal bleeding, contractions, leaking of fluid and fetal movement were reviewed in detail with the  patient.  All questions were answered. Pt has home bp cuff. Check bp weekly, let us  know if >140/90.   Follow-up: No follow-ups on file.   Future Appointments  Date Time Provider Department Center  04/06/2023  4:10 PM Marilynn Nest, DO [facility]YN  04/12/2023  3:00 PM [facility] - FTOBGYN US  [facility] None  04/12/2023  3:50 PM [facility]FT[facility]GY[facility] NURSE [facility]  04/12/2023  4:10 PM Jayne Vonn DEL, MD [facility]TO[facility]  04/19/2023  8:30 AM [facility] NURSE [facility]  04/19/2023  9:15 AM [facility] - FTOBGYN US  [facility] None  04/19/2023 10:10 AM Jayne Vonn DEL, MD [facility] FTOBGYN  04/26/2023  3:00 PM [facility] - FT IMG 2 [facility] None  04/26/2023  3:50 PM [facility] NURSE [facility]  04/26/2023  4:10 PM Jayne Vonn DEL, MD [facility]TO[facility]  05/05/2023  3:00 PM [facility] - FTOBGYN US  [facility] None  05/05/2023  3:50 PM [facility] NURSE [facility]  05/05/2023  4:10 PM Devory Mckinzie, DO [facility]TO[facility]  05/12/2023  3:00 PM [facility] - FTOBGYN US  [facility] None  05/12/2023  3:50 PM [facility]FT[facility]GY[facility] NURSE [facility]  05/12/2023  4:10 PM Jayne Vonn DEL, MD [facility] FTO[facility]  05/19/2023  3:00 PM [facility] - FTOBGYN US  [facility] None  05/19/2023  3:50 PM [facility] NURSE [facility]  05/19/2023  4:10 PM Jayne Vonn DEL, MD [facility]  05/26/2023  3:00 PM [facility] - FTOBGYN US  [facility] None  05/26/2023  3:50 PM [facility]FT[facility] NURSE [facility]  05/26/2023  4:10 PM Eure, Vonn DEL, MD [facility] FTO[facility]YN    No orders of the defined types were placed in this encounter.   Tonji Elliff, DO Attending Obstetrician & Gynecologist, [facility] for [employer], [facility]

## 2023-04-06 NOTE — Progress Notes (Signed)
 US  32+2 wks,breech,cx 3.2 cm,posterior placenta gr 0,AFI 18 cm,BPP 8/8,EFW 1853 g 27%

## 2023-04-07 ENCOUNTER — Other Ambulatory Visit: Payer: Medicaid Other

## 2023-04-08 LAB — CERVICOVAGINAL ANCILLARY ONLY
Bacterial Vaginitis (gardnerella): NEGATIVE
Candida Glabrata: NEGATIVE
Candida Vaginitis: NEGATIVE
Chlamydia: NEGATIVE
Comment: NEGATIVE
Comment: NEGATIVE
Comment: NEGATIVE
Comment: NEGATIVE
Comment: NEGATIVE
Comment: NORMAL
Neisseria Gonorrhea: NEGATIVE
Trichomonas: NEGATIVE

## 2023-04-08 MED ORDER — AMOXICILLIN 500 MG PO CAPS: 500.0000 mg | ORAL_CAPSULE | Freq: Three times a day (TID) | ORAL | 0 refills | Status: AC

## 2023-04-08 NOTE — Addendum Note (Signed)
 Addended by: Christel Cousins on: 04/08/2023 08:28 AM   Modules accepted: Orders

## 2023-04-10 ENCOUNTER — Encounter: Payer: Self-pay | Admitting: Obstetrics & Gynecology

## 2023-04-12 ENCOUNTER — Other Ambulatory Visit: Payer: Medicaid Other

## 2023-04-12 ENCOUNTER — Ambulatory Visit (INDEPENDENT_AMBULATORY_CARE_PROVIDER_SITE_OTHER): Payer: Medicaid Other

## 2023-04-12 ENCOUNTER — Ambulatory Visit (INDEPENDENT_AMBULATORY_CARE_PROVIDER_SITE_OTHER): Payer: Medicaid Other | Admitting: Obstetrics & Gynecology

## 2023-04-12 ENCOUNTER — Encounter: Payer: Self-pay | Admitting: Obstetrics & Gynecology

## 2023-04-12 VITALS — BP 114/67 | HR 86 | Wt 205.0 lb

## 2023-04-12 DIAGNOSIS — Z3A33 33 weeks gestation of pregnancy: Secondary | ICD-10-CM | POA: Diagnosis not present

## 2023-04-12 DIAGNOSIS — O24419 Gestational diabetes mellitus in pregnancy, unspecified control: Secondary | ICD-10-CM

## 2023-04-12 DIAGNOSIS — O0993 Supervision of high risk pregnancy, unspecified, third trimester: Secondary | ICD-10-CM | POA: Diagnosis not present

## 2023-04-12 NOTE — Progress Notes (Signed)
[place] 33+1 wks,cephalic,FHR 150 bpm,posterior placenta gr 0,AFI 13.8 cm,BPP 8/8

## 2023-04-12 NOTE — Progress Notes (Signed)
HIGH-RISK PREGNANCY VISIT Patient name: [name] MRN [id number]  Date of birth: 01/05/1986 Chief Complaint:   Routine Prenatal Visit  History of Present Illness:   [name] is a 38 y.o. G83P5005 female at [redacted]w[redacted]d with an Estimated Date of Delivery: 05/30/23 being seen today for ongoing management of a high-risk pregnancy complicated by A2DM .    Today she reports no complaints. Contractions: Not present. Vag. Bleeding: None.  Movement: Present. denies leaking of fluid.      12/01/2022   11:17 AM 11/16/2022    3:20 PM 09/30/2022    9:09 AM 08/26/2021    2:54 PM 01/08/2019    4:03 PM  Depression screen PHQ 2/9  Decreased Interest 0 0 0 0 0  Down, Depressed, Hopeless 0 0 0 0 0  PHQ - 2 Score 0 0 0 0 0  Altered sleeping  0 0 0   Tired, decreased energy  0 0 0   Change in appetite  0 0 0   Feeling bad or failure about yourself   0 0 0   Trouble concentrating  0 0 0   Moving slowly or fidgety/restless  0 0 0   Suicidal thoughts  0 0 0   PHQ-9 Score  0 0 0         11/16/2022    3:20 PM 09/30/2022    9:09 AM 08/26/2021    2:55 PM  GAD 7 : Generalized Anxiety Score  Nervous, Anxious, on Edge 0 0 0  Control/stop worrying 0 0 0  Worry too much - different things 0 0 0  Trouble relaxing 0 0 0  Restless 0 0 0  Easily annoyed or irritable 0 0 0  Afraid - awful might happen 0 0 0  Total GAD 7 Score 0 0 0     Review of Systems:   Pertinent items are noted in HPI Denies abnormal vaginal discharge w/ itching/odor/irritation, headaches, visual changes, shortness of breath, chest pain, abdominal pain, severe nausea/vomiting, or problems with urination or bowel movements unless otherwise stated above. Pertinent History Reviewed:  Reviewed past medical,surgical, social, obstetrical and family history.  Reviewed problem list, medications and allergies. Physical Assessment:   Vitals:   04/12/23 1538  BP: 114/67  Pulse: 86  Weight: 205 lb (93 kg)  Body mass index is  35.19 kg/m.           Physical Examination:   General appearance: alert, well appearing, and in no distress  Mental status: alert, oriented to person, place, and time  Skin: warm & dry   Extremities:      Cardiovascular: normal heart rate noted  Respiratory: normal respiratory effort, no distress  Abdomen: gravid, soft, non-tender  Pelvic: Cervical exam deferred         Fetal Status:     Movement: Present    Fetal Surveillance Testing today: BPP 8/8   Chaperone: N/A    No results found for this or any previous visit (from the past 24 hours).  Assessment & Plan:  High-risk pregnancy: [id number] at [redacted]w[redacted]d with an Estimated Date of Delivery: 05/30/23      ICD-10-CM   1. Supervision of high risk pregnancy in third trimester  O09.93     2. Gestational diabetes mellitus, class A2-MTF 500mg  BID  O24.419          Meds: No orders of the defined types were placed in this encounter.   Orders: No orders of the defined  types were placed in this encounter.    Labs/procedures today: U/S  Treatment Plan:  per schedule    Follow-up: No follow-ups on file.   Future Appointments  Date Time Provider Department Center  04/15/2023  9:10 AM [facility] NURSE [facility]  04/18/2023  9:10 AM [facility] NURSE [facility]  04/19/2023  9:15 AM [facility] - FTOBGYN [place] [facility] None  04/21/2023 10:50 AM Lazaro Arms, MD [facility] FTO[facility]  04/26/2023  3:00 PM [facility] - FT IMG 2 [facility] None  04/26/2023  4:10 PM Lazaro Arms, MD [facility] FTO[facility]YN  04/29/2023  8:30 AM [facility]FT[facility]GY[facility] NURSE [facility]  05/03/2023  3:30 PM [facility] NURSE [facility]  05/06/2023  9:15 AM [facility] - FTOBGYN [place] [facility] None  05/06/2023 10:30 AM Myna Hidalgo, DO [facility]  05/10/2023  3:30 PM [facility] NURSE [facility]  05/13/2023  8:50 AM [facility] NURSE [facility]  05/13/2023  9:10 AM Lazaro Arms, MD [facility]  05/17/2023  3:00 PM [facility] - FT IMG 2 [facility] None  05/17/2023  3:50 PM Myna Hidalgo, DO  [facility] FTO[facility]  05/20/2023  8:50 AM [facility] NURSE [facility]  05/24/2023  8:50 AM [facility] NURSE [facility]  05/27/2023  9:15 AM [facility] - FTOBGYN [place] [facility] None  05/27/2023 10:10 AM Lazaro Arms, MD [facility]    No orders of the defined types were placed in this encounter.  Lazaro Arms  Attending Physician for the Center for [facility] 04/12/2023 4:23 PM

## 2023-04-15 ENCOUNTER — Ambulatory Visit: Payer: Medicaid Other | Admitting: *Deleted

## 2023-04-15 VITALS — BP 108/63 | HR 83

## 2023-04-15 DIAGNOSIS — O0993 Supervision of high risk pregnancy, unspecified, third trimester: Secondary | ICD-10-CM

## 2023-04-15 DIAGNOSIS — O24419 Gestational diabetes mellitus in pregnancy, unspecified control: Secondary | ICD-10-CM

## 2023-04-15 DIAGNOSIS — Z3A33 33 weeks gestation of pregnancy: Secondary | ICD-10-CM | POA: Diagnosis not present

## 2023-04-15 DIAGNOSIS — O0990 Supervision of high risk pregnancy, unspecified, unspecified trimester: Secondary | ICD-10-CM

## 2023-04-15 NOTE — Progress Notes (Signed)
   NURSE VISIT- NST  SUBJECTIVE:  [name] is a 38 y.o. G68P5005 female at [redacted]w[redacted]d, here for a NST for pregnancy complicated by Diabetes: A2/BDM}.  She reports active fetal movement, contractions: none, vaginal bleeding: none, membranes: intact.   OBJECTIVE:  BP 108/63   Pulse 83   LMP  (LMP Unknown)   Appears well, no apparent distress  No results found for this or any previous visit (from the past 24 hours).  NST: FHR baseline 125 bpm, Variability: moderate, Accelerations:present, Decelerations:  Absent= Cat 1/reactive Toco: none   ASSESSMENT: G6P5005 at 100w4d with Diabetes: A2/BDM} NST reactive  PLAN: EFM strip reviewed by Dr. Charlotta Newton   Recommendations: keep next appointment as scheduled    Jobe Marker  04/15/2023 10:20 AM

## 2023-04-16 ENCOUNTER — Other Ambulatory Visit: Payer: Self-pay | Admitting: Women's Health

## 2023-04-18 ENCOUNTER — Other Ambulatory Visit: Payer: Self-pay | Admitting: *Deleted

## 2023-04-18 ENCOUNTER — Ambulatory Visit: Payer: Medicaid Other | Admitting: *Deleted

## 2023-04-18 VITALS — BP 131/67 | HR 93 | Wt 208.6 lb

## 2023-04-18 DIAGNOSIS — O24414 Gestational diabetes mellitus in pregnancy, insulin controlled: Secondary | ICD-10-CM

## 2023-04-18 DIAGNOSIS — O24419 Gestational diabetes mellitus in pregnancy, unspecified control: Secondary | ICD-10-CM

## 2023-04-18 DIAGNOSIS — Z3A34 34 weeks gestation of pregnancy: Secondary | ICD-10-CM | POA: Diagnosis not present

## 2023-04-18 NOTE — Progress Notes (Signed)
   NURSE VISIT- NST  SUBJECTIVE:  [name] is a 38 y.o. G36P5005 female at [redacted]w[redacted]d, here for a NST for pregnancy complicated by Diabetes: A2DM}.  She reports active fetal movement, contractions: none, vaginal bleeding: none, membranes: intact.   OBJECTIVE:  BP 131/67   Pulse 93   Wt 208 lb 9.6 oz (94.6 kg)   LMP  (LMP Unknown)   BMI 35.81 kg/m   Appears well, no apparent distress  No results found for this or any previous visit (from the past 24 hours).  NST: FHR baseline 135 bpm, Variability: moderate, Accelerations:present, Decelerations:  Absent= Cat 1/reactive Toco: none   ASSESSMENT: G6P5005 at [redacted]w[redacted]d with Diabetes: A2DM} NST reactive  PLAN: EFM strip reviewed by Dr. Despina Hidden   Recommendations: keep next appointment as scheduled    [name]  04/18/2023 12:36 PM

## 2023-04-19 ENCOUNTER — Other Ambulatory Visit: Payer: Medicaid Other

## 2023-04-19 ENCOUNTER — Encounter: Payer: Medicaid Other | Admitting: Obstetrics & Gynecology

## 2023-04-20 ENCOUNTER — Other Ambulatory Visit: Payer: Medicaid Other | Admitting: Radiology

## 2023-04-20 ENCOUNTER — Other Ambulatory Visit: Payer: Medicaid Other

## 2023-04-21 ENCOUNTER — Other Ambulatory Visit: Payer: Medicaid Other

## 2023-04-21 ENCOUNTER — Encounter: Payer: Self-pay | Admitting: Obstetrics & Gynecology

## 2023-04-21 ENCOUNTER — Ambulatory Visit: Payer: Medicaid Other | Admitting: Obstetrics & Gynecology

## 2023-04-21 VITALS — BP 112/67 | HR 96 | Wt 208.0 lb

## 2023-04-21 DIAGNOSIS — Z3A34 34 weeks gestation of pregnancy: Secondary | ICD-10-CM | POA: Diagnosis not present

## 2023-04-21 DIAGNOSIS — O24414 Gestational diabetes mellitus in pregnancy, insulin controlled: Secondary | ICD-10-CM

## 2023-04-21 DIAGNOSIS — O0990 Supervision of high risk pregnancy, unspecified, unspecified trimester: Secondary | ICD-10-CM

## 2023-04-21 DIAGNOSIS — O0993 Supervision of high risk pregnancy, unspecified, third trimester: Secondary | ICD-10-CM | POA: Diagnosis not present

## 2023-04-21 DIAGNOSIS — O24419 Gestational diabetes mellitus in pregnancy, unspecified control: Secondary | ICD-10-CM

## 2023-04-21 NOTE — Progress Notes (Signed)
 HIGH-RISK PREGNANCY VISIT Patient name: [name] MRN [id number]  Date of birth: 1985/11/15 Chief Complaint:   Routine Prenatal Visit and Non-stress Test  History of Present Illness:   [name] is a 38 y.o. G105P5005 female at [redacted]w[redacted]d with an Estimated Date of Delivery: 05/30/23 being seen today for ongoing management of a high-risk pregnancy complicated by A2DM on metformin 500 twice daily with excellent CBg.    Today she reports no complaints. Contractions: Not present. Vag. Bleeding: None.  Movement: Present. denies leaking of fluid.      12/01/2022   11:17 AM 11/16/2022    3:20 PM 09/30/2022    9:09 AM 08/26/2021    2:54 PM 01/08/2019    4:03 PM  Depression screen PHQ 2/9  Decreased Interest 0 0 0 0 0  Down, Depressed, Hopeless 0 0 0 0 0  PHQ - 2 Score 0 0 0 0 0  Altered sleeping  0 0 0   Tired, decreased energy  0 0 0   Change in appetite  0 0 0   Feeling bad or failure about yourself   0 0 0   Trouble concentrating  0 0 0   Moving slowly or fidgety/restless  0 0 0   Suicidal thoughts  0 0 0   PHQ-9 Score  0 0 0         11/16/2022    3:20 PM 09/30/2022    9:09 AM 08/26/2021    2:55 PM  GAD 7 : Generalized Anxiety Score  Nervous, Anxious, on Edge 0 0 0  Control/stop worrying 0 0 0  Worry too much - different things 0 0 0  Trouble relaxing 0 0 0  Restless 0 0 0  Easily annoyed or irritable 0 0 0  Afraid - awful might happen 0 0 0  Total GAD 7 Score 0 0 0     Review of Systems:   Pertinent items are noted in HPI Denies abnormal vaginal discharge w/ itching/odor/irritation, headaches, visual changes, shortness of breath, chest pain, abdominal pain, severe nausea/vomiting, or problems with urination or bowel movements unless otherwise stated above. Pertinent History Reviewed:  Reviewed past medical,surgical, social, obstetrical and family history.  Reviewed problem list, medications and allergies. Physical Assessment:   Vitals:   04/21/23 1108  BP:  112/67  Pulse: 96  Weight: 208 lb (94.3 kg)  Body mass index is 35.7 kg/m.           Physical Examination:   General appearance: alert, well appearing, and in no distress  Mental status: alert, oriented to person, place, and time  Skin: warm & dry   Extremities: Edema: None    Cardiovascular: normal heart rate noted  Respiratory: normal respiratory effort, no distress  Abdomen: gravid, soft, non-tender  Pelvic: Cervical exam deferred         Fetal Status:     Movement: Present    Fetal Surveillance Testing today: reactive NST  [name] is at [redacted]w[redacted]d Estimated Date of Delivery: 05/30/23  NST being performed due to A2DM  Today the NST is Reactive  Fetal Monitoring:  Baseline: 140s bpm, Variability: Good {> 6 bpm), Accelerations: Reactive, and Decelerations: Absent   reactive  The accelerations are >15 bpm and more than 2 in 20 minutes  Final diagnosis:  Reactive NST  Lazaro Arms, MD     Chaperone: N/A    No results found for this or any previous visit (from the past  24 hours).  Assessment & Plan:  High-risk pregnancy: [id number] at [redacted]w[redacted]d with an Estimated Date of Delivery: 05/30/23      ICD-10-CM   1. Supervision of high risk pregnancy, antepartum  O09.90     2. Gestational diabetes mellitus, class A2-MTF 500mg  BID  O24.419         Meds: No orders of the defined types were placed in this encounter.   Orders: No orders of the defined types were placed in this encounter.    Labs/procedures today: NST  Treatment Plan:  keep scheduled    Follow-up: Return for keep scheduled.   Future Appointments  Date Time Provider Department Center  04/26/2023  3:00 PM [facility] - FT IMG 2 [facility] None  04/26/2023  4:10 PM Lazaro Arms, MD [facility]  04/29/2023  8:30 AM [facility]GY[facility] NURSE [facility]  05/03/2023  3:30 PM [facility] NURSE [facility]  05/06/2023  9:15 AM [facility] - FTOBGYN [place] [facility] None  05/06/2023 10:30 AM Myna Hidalgo, DO [facility]   05/10/2023  3:30 PM [facility] NURSE [facility]  05/13/2023  8:50 AM [facility] NURSE [facility]  05/13/2023  9:10 AM Lazaro Arms, MD [facility]  05/17/2023  3:00 PM [facility] - FT IMG 2 [facility]FT[facility] None  05/17/2023  3:50 PM Myna Hidalgo, DO [facility]  05/20/2023  8:50 AM [facility] NURSE [facility]  05/24/2023  8:50 AM [facility] NURSE [facility]  05/27/2023  9:15 AM [facility] - FTOBGYN [place] [facility]G None  05/27/2023 10:10 AM Lazaro Arms, MD [facility]    No orders of the defined types were placed in this encounter.  Lazaro Arms  Attending Physician for the Center for [facility] 04/21/2023 11:42 AM

## 2023-04-22 ENCOUNTER — Other Ambulatory Visit: Payer: Medicaid Other

## 2023-04-25 ENCOUNTER — Other Ambulatory Visit: Payer: Self-pay | Admitting: Obstetrics & Gynecology

## 2023-04-25 DIAGNOSIS — O09899 Supervision of other high risk pregnancies, unspecified trimester: Secondary | ICD-10-CM

## 2023-04-25 DIAGNOSIS — O09523 Supervision of elderly multigravida, third trimester: Secondary | ICD-10-CM

## 2023-04-25 DIAGNOSIS — O0990 Supervision of high risk pregnancy, unspecified, unspecified trimester: Secondary | ICD-10-CM

## 2023-04-25 DIAGNOSIS — Z3A30 30 weeks gestation of pregnancy: Secondary | ICD-10-CM

## 2023-04-25 DIAGNOSIS — N898 Other specified noninflammatory disorders of vagina: Secondary | ICD-10-CM

## 2023-04-25 DIAGNOSIS — O24419 Gestational diabetes mellitus in pregnancy, unspecified control: Secondary | ICD-10-CM

## 2023-04-26 ENCOUNTER — Ambulatory Visit: Payer: Medicaid Other | Admitting: Obstetrics & Gynecology

## 2023-04-26 ENCOUNTER — Ambulatory Visit (INDEPENDENT_AMBULATORY_CARE_PROVIDER_SITE_OTHER): Payer: Medicaid Other | Admitting: Radiology

## 2023-04-26 ENCOUNTER — Other Ambulatory Visit: Payer: Medicaid Other

## 2023-04-26 ENCOUNTER — Encounter: Payer: Self-pay | Admitting: Obstetrics & Gynecology

## 2023-04-26 VITALS — BP 136/76 | HR 96 | Wt 205.0 lb

## 2023-04-26 DIAGNOSIS — O0990 Supervision of high risk pregnancy, unspecified, unspecified trimester: Secondary | ICD-10-CM

## 2023-04-26 DIAGNOSIS — O0993 Supervision of high risk pregnancy, unspecified, third trimester: Secondary | ICD-10-CM | POA: Diagnosis not present

## 2023-04-26 DIAGNOSIS — O24419 Gestational diabetes mellitus in pregnancy, unspecified control: Secondary | ICD-10-CM

## 2023-04-26 DIAGNOSIS — O09523 Supervision of elderly multigravida, third trimester: Secondary | ICD-10-CM | POA: Diagnosis not present

## 2023-04-26 DIAGNOSIS — O24414 Gestational diabetes mellitus in pregnancy, insulin controlled: Secondary | ICD-10-CM | POA: Diagnosis not present

## 2023-04-26 DIAGNOSIS — Z3A33 33 weeks gestation of pregnancy: Secondary | ICD-10-CM

## 2023-04-26 NOTE — Progress Notes (Signed)
 HIGH-RISK PREGNANCY VISIT Patient name: [name] MRN [id number]  Date of birth: 09-Mar-1985 Chief Complaint:   Routine Prenatal Visit  History of Present Illness:   [name] is a 38 y.o. G3P5005 female at [redacted]w[redacted]d with an Estimated Date of Delivery: 05/30/23 being seen today for ongoing management of a high-risk pregnancy complicated by A2M on metformin 500 mg BID.    Today she reports no complaints. Contractions: Not present. Vag. Bleeding: None.  Movement: Present. denies leaking of fluid.      12/01/2022   11:17 AM 11/16/2022    3:20 PM 09/30/2022    9:09 AM 08/26/2021    2:54 PM 01/08/2019    4:03 PM  Depression screen PHQ 2/9  Decreased Interest 0 0 0 0 0  Down, Depressed, Hopeless 0 0 0 0 0  PHQ - 2 Score 0 0 0 0 0  Altered sleeping  0 0 0   Tired, decreased energy  0 0 0   Change in appetite  0 0 0   Feeling bad or failure about yourself   0 0 0   Trouble concentrating  0 0 0   Moving slowly or fidgety/restless  0 0 0   Suicidal thoughts  0 0 0   PHQ-9 Score  0 0 0         11/16/2022    3:20 PM 09/30/2022    9:09 AM 08/26/2021    2:55 PM  GAD 7 : Generalized Anxiety Score  Nervous, Anxious, on Edge 0 0 0  Control/stop worrying 0 0 0  Worry too much - different things 0 0 0  Trouble relaxing 0 0 0  Restless 0 0 0  Easily annoyed or irritable 0 0 0  Afraid - awful might happen 0 0 0  Total GAD 7 Score 0 0 0     Review of Systems:   Pertinent items are noted in HPI Denies abnormal vaginal discharge w/ itching/odor/irritation, headaches, visual changes, shortness of breath, chest pain, abdominal pain, severe nausea/vomiting, or problems with urination or bowel movements unless otherwise stated above. Pertinent History Reviewed:  Reviewed past medical,surgical, social, obstetrical and family history.  Reviewed problem list, medications and allergies. Physical Assessment:   Vitals:   04/26/23 1523  BP: 136/76  Pulse: 96  Weight: 205 lb (93 kg)   Body mass index is 35.19 kg/m.           Physical Examination:   General appearance: alert, well appearing, and in no distress  Mental status: alert, oriented to person, place, and time  Skin: warm & dry   Extremities:      Cardiovascular: normal heart rate noted  Respiratory: normal respiratory effort, no distress  Abdomen: gravid, soft, non-tender  Pelvic: Cervical exam deferred         Fetal Status:     Movement: Present    Fetal Surveillance Testing today: BPP 8/8   Chaperone: N/A    No results found for this or any previous visit (from the past 24 hours).  Assessment & Plan:  High-risk pregnancy: [id number] at [redacted]w[redacted]d with an Estimated Date of Delivery: 05/30/23      ICD-10-CM   1. Supervision of high risk pregnancy, antepartum  O09.90     2. Gestational diabetes mellitus, class A2-MTF 500mg  BID  O24.419          Meds: No orders of the defined types were placed in this encounter.   Orders: No orders of  the defined types were placed in this encounter.    Labs/procedures today: U/S  Treatment Plan:  keep scheduled    Follow-up: No follow-ups on file.   Future Appointments  Date Time Provider Department Center  04/26/2023  4:10 PM Lazaro Arms, MD [facility]  04/29/2023  8:30 AM [facility]FT[facility] NURSE [facility]  05/03/2023  3:30 PM [facility] NURSE [facility]  05/06/2023  9:15 AM [facility] - FTOBGYN [place] [facility]FT[facility] None  05/06/2023 10:30 AM Myna Hidalgo, DO [facility]  05/10/2023  3:30 PM [facility]FT[facility] NURSE [facility]  05/13/2023  8:50 AM [facility]FT[facility] NURSE [facility]  05/13/2023  9:10 AM Lazaro Arms, MD [facility]  05/17/2023  3:00 PM [facility] - FT IMG 2 [facility]FT[facility] None  05/17/2023  3:50 PM Myna Hidalgo, DO [facility]  05/20/2023  8:50 AM [facility]FT[facility] NURSE [facility]  05/24/2023  8:50 AM [facility] NURSE [facility]  05/27/2023  9:15 AM [facility] - FTO[facility]YN [place] [facility] None  05/27/2023 10:10 AM Lazaro Arms, MD [facility]    No orders of  the defined types were placed in this encounter.  Lazaro Arms  Attending Physician for the Center for [facility] 04/26/2023 3:37 PM

## 2023-04-26 NOTE — Progress Notes (Signed)
 [place]:  GA = 35+1 weeks Single active fetus, cephalic, FHR = 133 bpm  AFI = 14.4 cm,  52%,  MVP = 4.8 cm,   Posterior Right Lateral placenta, high, gr1  BPP = 8/8    Cervix appears long and closed

## 2023-04-29 ENCOUNTER — Ambulatory Visit: Payer: Medicaid Other | Admitting: *Deleted

## 2023-04-29 VITALS — BP 109/70 | HR 86

## 2023-04-29 DIAGNOSIS — O24414 Gestational diabetes mellitus in pregnancy, insulin controlled: Secondary | ICD-10-CM

## 2023-04-29 DIAGNOSIS — Z3A35 35 weeks gestation of pregnancy: Secondary | ICD-10-CM

## 2023-04-29 DIAGNOSIS — O0993 Supervision of high risk pregnancy, unspecified, third trimester: Secondary | ICD-10-CM

## 2023-04-29 DIAGNOSIS — O24419 Gestational diabetes mellitus in pregnancy, unspecified control: Secondary | ICD-10-CM

## 2023-04-29 DIAGNOSIS — O0990 Supervision of high risk pregnancy, unspecified, unspecified trimester: Secondary | ICD-10-CM

## 2023-04-29 NOTE — Progress Notes (Signed)
   NURSE VISIT- NST  SUBJECTIVE:  [name] is a 38 y.o. G59P5005 female at [redacted]w[redacted]d, here for a NST for pregnancy complicated by Diabetes: A2/BDM}.  She reports active fetal movement, contractions: none, vaginal bleeding: none, membranes: intact.   OBJECTIVE:  BP 109/70   Pulse 86   LMP  (LMP Unknown)   Appears well, no apparent distress  No results found for this or any previous visit (from the past 24 hours).  NST: FHR baseline 130 bpm, Variability: moderate, Accelerations:present, Decelerations:  Absent= Cat 1/reactive Toco: none   ASSESSMENT: [id number] at [redacted]w[redacted]d with Diabetes: A2/BDM} NST reactive  PLAN: EFM strip reviewed by Dr. Despina Hidden   Recommendations: keep next appointment as scheduled    Debbe Odea Helios Kohlmann  04/29/2023 10:00 AM

## 2023-05-03 ENCOUNTER — Ambulatory Visit (INDEPENDENT_AMBULATORY_CARE_PROVIDER_SITE_OTHER): Payer: Medicaid Other | Admitting: *Deleted

## 2023-05-03 ENCOUNTER — Other Ambulatory Visit: Payer: Medicaid Other | Admitting: Radiology

## 2023-05-03 VITALS — BP 119/68 | HR 96 | Wt 204.2 lb

## 2023-05-03 DIAGNOSIS — Z3A36 36 weeks gestation of pregnancy: Secondary | ICD-10-CM

## 2023-05-03 DIAGNOSIS — O24414 Gestational diabetes mellitus in pregnancy, insulin controlled: Secondary | ICD-10-CM | POA: Diagnosis not present

## 2023-05-03 DIAGNOSIS — O0993 Supervision of high risk pregnancy, unspecified, third trimester: Secondary | ICD-10-CM

## 2023-05-03 DIAGNOSIS — O24419 Gestational diabetes mellitus in pregnancy, unspecified control: Secondary | ICD-10-CM

## 2023-05-03 NOTE — Progress Notes (Signed)
   NURSE VISIT- NST  SUBJECTIVE:  [name] is a 38 y.o. G34P5005 female at [redacted]w[redacted]d, here for a NST for pregnancy complicated by Diabetes: A2/BDM}.  She reports active fetal movement, contractions: none, vaginal bleeding: none, membranes: intact.   OBJECTIVE:  BP 119/68   Pulse 96   Wt 204 lb 3.2 oz (92.6 kg)   LMP  (LMP Unknown)   BMI 35.05 kg/m   Appears well, no apparent distress  No results found for this or any previous visit (from the past 24 hours).  NST: FHR baseline 135 bpm, Variability: moderate, Accelerations:present, Decelerations:  Absent= Cat 1/reactive Toco: none   ASSESSMENT: [id number] at [redacted]w[redacted]d with Diabetes: A2/BDM} NST reactive  PLAN: EFM strip reviewed by [name], CNM, [facility]   Recommendations: keep next appointment as scheduled    [name]  05/03/2023 3:58 PM

## 2023-05-05 ENCOUNTER — Other Ambulatory Visit: Payer: Medicaid Other

## 2023-05-05 ENCOUNTER — Other Ambulatory Visit: Payer: Self-pay | Admitting: Obstetrics & Gynecology

## 2023-05-05 ENCOUNTER — Encounter: Payer: Medicaid Other | Admitting: Obstetrics & Gynecology

## 2023-05-05 DIAGNOSIS — O09523 Supervision of elderly multigravida, third trimester: Secondary | ICD-10-CM

## 2023-05-05 DIAGNOSIS — O24419 Gestational diabetes mellitus in pregnancy, unspecified control: Secondary | ICD-10-CM

## 2023-05-05 DIAGNOSIS — O0990 Supervision of high risk pregnancy, unspecified, unspecified trimester: Secondary | ICD-10-CM

## 2023-05-06 ENCOUNTER — Ambulatory Visit: Payer: Medicaid Other

## 2023-05-06 ENCOUNTER — Other Ambulatory Visit (HOSPITAL_COMMUNITY)
Admission: RE | Admit: 2023-05-06 | Discharge: 2023-05-06 | Disposition: A | Discharge status: Home / Self Care | Source: Ambulatory Visit | Attending: Obstetrics & Gynecology | Admitting: Obstetrics & Gynecology

## 2023-05-06 ENCOUNTER — Ambulatory Visit: Payer: Medicaid Other | Admitting: Obstetrics & Gynecology

## 2023-05-06 ENCOUNTER — Encounter: Payer: Self-pay | Admitting: Obstetrics & Gynecology

## 2023-05-06 VITALS — BP 128/70 | HR 84 | Wt 204.0 lb

## 2023-05-06 DIAGNOSIS — O0990 Supervision of high risk pregnancy, unspecified, unspecified trimester: Secondary | ICD-10-CM

## 2023-05-06 DIAGNOSIS — R768 Other specified abnormal immunological findings in serum: Secondary | ICD-10-CM | POA: Diagnosis not present

## 2023-05-06 DIAGNOSIS — O0993 Supervision of high risk pregnancy, unspecified, third trimester: Secondary | ICD-10-CM | POA: Diagnosis not present

## 2023-05-06 DIAGNOSIS — O09523 Supervision of elderly multigravida, third trimester: Secondary | ICD-10-CM

## 2023-05-06 DIAGNOSIS — Z3A36 36 weeks gestation of pregnancy: Secondary | ICD-10-CM | POA: Insufficient documentation

## 2023-05-06 DIAGNOSIS — O24419 Gestational diabetes mellitus in pregnancy, unspecified control: Secondary | ICD-10-CM

## 2023-05-06 MED ORDER — VALACYCLOVIR HCL 500 MG PO TABS: 500.0000 mg | ORAL_TABLET | Freq: Two times a day (BID) | ORAL | 1 refills | Status: DC

## 2023-05-06 NOTE — Progress Notes (Signed)
 [place] 36+4 wks,cephalic,BPP 8/8,FHR 164 bpm,posterior placenta gr 1,AFI 16 cm,EFW 2871 g 43%

## 2023-05-06 NOTE — Progress Notes (Signed)
 HIGH-RISK PREGNANCY VISIT Patient name: [name] MRN [id number]  Date of birth: 1986-02-10 Chief Complaint:   Routine Prenatal Visit  History of Present Illness:   [name] is a 38 y.o. G78P5005 female at [redacted]w[redacted]d with an Estimated Date of Delivery: 05/30/23 being seen today for ongoing management of a high-risk pregnancy complicated by:  -GDMA2 Well controlled with metformin  -HSV2 Asymptomatic   Today she reports no complaints.   Contractions: Not present. Vag. Bleeding: None.  Movement: Present. denies leaking of fluid.      12/01/2022   11:17 AM 11/16/2022    3:20 PM 09/30/2022    9:09 AM 08/26/2021    2:54 PM 01/08/2019    4:03 PM  Depression screen PHQ 2/9  Decreased Interest 0 0 0 0 0  Down, Depressed, Hopeless 0 0 0 0 0  PHQ - 2 Score 0 0 0 0 0  Altered sleeping  0 0 0   Tired, decreased energy  0 0 0   Change in appetite  0 0 0   Feeling bad or failure about yourself   0 0 0   Trouble concentrating  0 0 0   Moving slowly or fidgety/restless  0 0 0   Suicidal thoughts  0 0 0   PHQ-9 Score  0 0 0      Current Outpatient Medications  Medication Instructions   Accu-Chek Softclix Lancets lancets Use as instructed to check blood sugar 4 times daily   aspirin EC 81 mg, Oral, Daily, Swallow whole.   Blood Glucose Monitoring Suppl (ACCU-CHEK GUIDE ME) w/Device KIT 1 each, Does not apply, 4 times daily   Doxylamine-Pyridoxine (DICLEGIS) 10-10 MG TBEC 2 tabs q hs, if sx persist add 1 tab q am on day 3, if sx persist add 1 tab q afternoon on day 4   glucose blood (ACCU-CHEK GUIDE) test strip Use as instructed to check blood sugar 4 times daily   metFORMIN (GLUCOPHAGE) 500 MG tablet TAKE 1 TABLET(500 MG) BY MOUTH TWICE DAILY WITH A MEAL   omeprazole (PRILOSEC) 20 mg, Oral, Daily, 1 tablet a day   ondansetron (ZOFRAN) 4 mg, Oral, Every 6 hours   Prenatal Vit-Fe Fumarate-FA (PRENATAL VITAMINS) 28-0.8 MG TABS 1 tablet, Oral, Daily   promethazine (PHENERGAN) 25  mg, Oral, Every 6 hours PRN   valACYclovir (VALTREX) 500 mg, Oral, 2 times daily     Review of Systems:   Pertinent items are noted in HPI Denies abnormal vaginal discharge w/ itching/odor/irritation, headaches, visual changes, shortness of breath, chest pain, abdominal pain, severe nausea/vomiting, or problems with urination or bowel movements unless otherwise stated above. Pertinent History Reviewed:  Reviewed past medical,surgical, social, obstetrical and family history.  Reviewed problem list, medications and allergies. Physical Assessment:   Vitals:   05/06/23 1006  BP: 128/70  Pulse: 84  Weight: 204 lb (92.5 kg)  Body mass index is 35.02 kg/m.           Physical Examination:   General appearance: alert, well appearing, and in no distress  Mental status: normal mood, behavior, speech, dress, motor activity, and thought processes  Skin: warm & dry   Extremities: Edema: None    Cardiovascular: normal heart rate noted  Respiratory: normal respiratory effort, no distress  Abdomen: gravid, soft, non-tender  Pelvic:  closed/thick/-3   Vaginal swabs obtained       Fetal Status:     Movement: Present    Fetal Surveillance Testing today: ,cephalic,BPP  8/8,FHR 164 bpm,posterior placenta gr 1,AFI 16 cm,EFW 2871 g 43%    Chaperone: Latisha Cresenzo    No results found for this or any previous visit (from the past 24 hours).   Assessment & Plan:  High-risk pregnancy: [id number] at [redacted]w[redacted]d with an Estimated Date of Delivery: 05/30/23   1. [redacted] weeks gestation of pregnancy - Cervicovaginal ancillary only - Culture, beta strep (group b only)  2. Supervision of high risk pregnancy, antepartum (Primary) -routine OB care - Cervicovaginal ancillary only - Culture, beta strep (group b only)  3. HSV-2 seropositive - valACYclovir (VALTREX) 500 MG tablet; Take 1 tablet (500 mg total) by mouth 2 (two) times daily.  Dispense: 60 tablet; Refill: 1  4. Gestational diabetes mellitus, class  A2 BPP 8/8, continue antepartum testing -reviewed IOL @ 39wk  (around March 24)   Meds:  Meds ordered this encounter  Medications   valACYclovir (VALTREX) 500 MG tablet    Sig: Take 1 tablet (500 mg total) by mouth 2 (two) times daily.    Dispense:  60 tablet    Refill:  1    Labs/procedures today: BPP  Treatment Plan:  routine OB care and as outlined above  Reviewed: Preterm labor symptoms and general obstetric precautions including but not limited to vaginal bleeding, contractions, leaking of fluid and fetal movement were reviewed in detail with the patient.  All questions were answered. Pt has home bp cuff. Check bp weekly, let us know if >140/90.   Follow-up: Return for as scheduled twice weekly.   Future Appointments  Date Time Provider Department Center  05/06/2023 10:30 AM Myna Hidalgo, DO [facility]  05/10/2023  3:30 PM [facility]FT[facility] NURSE [facility]  05/13/2023  8:50 AM [facility] NURSE [facility]  05/13/2023  9:10 AM Lazaro Arms, MD [facility]  05/17/2023  3:00 PM [facility] - FT IMG 2 [facility]FT[facility]G None  05/17/2023  3:50 PM Myna Hidalgo, DO [facility]  05/20/2023  8:50 AM [facility] NURSE [facility]  05/24/2023  8:50 AM [facility] NURSE [facility]  05/27/2023  9:15 AM [facility] - FTO[facility]YN [place] [facility]FT[facility] None  05/27/2023 10:10 AM Lazaro Arms, MD [facility]    Orders Placed This Encounter  Procedures   Culture, beta strep (group b only)    Myna Hidalgo, DO Attending Obstetrician & Gynecologist, [facility] Practice Center for [employer], [facility]

## 2023-05-09 LAB — CERVICOVAGINAL ANCILLARY ONLY
Chlamydia: NEGATIVE
Comment: NEGATIVE
Comment: NORMAL
Neisseria Gonorrhea: NEGATIVE

## 2023-05-09 LAB — CULTURE, BETA STREP (GROUP B ONLY): Strep Gp B Culture: POSITIVE — AB

## 2023-05-10 ENCOUNTER — Ambulatory Visit: Payer: Medicaid Other | Admitting: *Deleted

## 2023-05-10 VITALS — BP 122/73 | HR 92 | Wt 205.0 lb

## 2023-05-10 DIAGNOSIS — Z3A37 37 weeks gestation of pregnancy: Secondary | ICD-10-CM

## 2023-05-10 DIAGNOSIS — O24419 Gestational diabetes mellitus in pregnancy, unspecified control: Secondary | ICD-10-CM

## 2023-05-10 NOTE — Progress Notes (Signed)
   NURSE VISIT- NST  SUBJECTIVE:  [name] is a 38 y.o. G48P5005 female at [redacted]w[redacted]d, here for a NST for pregnancy complicated by Diabetes: A2/BDM}.  She reports active fetal movement, contractions: none, vaginal bleeding: none, membranes: intact.   OBJECTIVE:  BP 122/73   Pulse 92   Wt 205 lb (93 kg)   LMP  (LMP Unknown)   BMI 35.19 kg/m   Appears well, no apparent distress  No results found for this or any previous visit (from the past 24 hours).  NST: FHR baseline 150 bpm, Variability: moderate, Accelerations:present, Decelerations:  Absent= Cat 1/reactive Toco: none   ASSESSMENT: [id number] at [redacted]w[redacted]d with Diabetes: A2/BDM} NST reactive  PLAN: EFM strip reviewed by Dr. Despina Hidden   Recommendations: keep next appointment as scheduled    [name]  05/10/2023 3:52 PM

## 2023-05-12 ENCOUNTER — Other Ambulatory Visit: Payer: Medicaid Other

## 2023-05-12 ENCOUNTER — Encounter: Payer: Medicaid Other | Admitting: Obstetrics & Gynecology

## 2023-05-13 ENCOUNTER — Ambulatory Visit: Payer: Medicaid Other | Admitting: Obstetrics & Gynecology

## 2023-05-13 ENCOUNTER — Other Ambulatory Visit: Payer: Medicaid Other

## 2023-05-13 VITALS — BP 116/66 | HR 93 | Wt 207.0 lb

## 2023-05-13 DIAGNOSIS — O0993 Supervision of high risk pregnancy, unspecified, third trimester: Secondary | ICD-10-CM

## 2023-05-13 DIAGNOSIS — Z3A37 37 weeks gestation of pregnancy: Secondary | ICD-10-CM | POA: Diagnosis not present

## 2023-05-13 DIAGNOSIS — O24419 Gestational diabetes mellitus in pregnancy, unspecified control: Secondary | ICD-10-CM | POA: Diagnosis not present

## 2023-05-13 DIAGNOSIS — O0990 Supervision of high risk pregnancy, unspecified, unspecified trimester: Secondary | ICD-10-CM

## 2023-05-13 NOTE — Progress Notes (Signed)
 HIGH-RISK PREGNANCY VISIT Patient name: [name] MRN [id number]  Date of birth: 1985-07-06 Chief Complaint:   Routine Prenatal Visit  History of Present Illness:   [name] is a 38 y.o. G35P5005 female at [redacted]w[redacted]d with an Estimated Date of Delivery: 05/30/23 being seen today for ongoing management of a high-risk pregnancy complicated by A2/B DM on MTF 500 BID.    Today she reports no complaints. Contractions: Not present. Vag. Bleeding: None.  Movement: Present. denies leaking of fluid.      12/01/2022   11:17 AM 11/16/2022    3:20 PM 09/30/2022    9:09 AM 08/26/2021    2:54 PM 01/08/2019    4:03 PM  Depression screen PHQ 2/9  Decreased Interest 0 0 0 0 0  Down, Depressed, Hopeless 0 0 0 0 0  PHQ - 2 Score 0 0 0 0 0  Altered sleeping  0 0 0   Tired, decreased energy  0 0 0   Change in appetite  0 0 0   Feeling bad or failure about yourself   0 0 0   Trouble concentrating  0 0 0   Moving slowly or fidgety/restless  0 0 0   Suicidal thoughts  0 0 0   PHQ-9 Score  0 0 0         11/16/2022    3:20 PM 09/30/2022    9:09 AM 08/26/2021    2:55 PM  GAD 7 : Generalized Anxiety Score  Nervous, Anxious, on Edge 0 0 0  Control/stop worrying 0 0 0  Worry too much - different things 0 0 0  Trouble relaxing 0 0 0  Restless 0 0 0  Easily annoyed or irritable 0 0 0  Afraid - awful might happen 0 0 0  Total GAD 7 Score 0 0 0     Review of Systems:   Pertinent items are noted in HPI Denies abnormal vaginal discharge w/ itching/odor/irritation, headaches, visual changes, shortness of breath, chest pain, abdominal pain, severe nausea/vomiting, or problems with urination or bowel movements unless otherwise stated above. Pertinent History Reviewed:  Reviewed past medical,surgical, social, obstetrical and family history.  Reviewed problem list, medications and allergies. Physical Assessment:   Vitals:   05/13/23 0902  BP: 116/66  Pulse: 93  Weight: 207 lb (93.9 kg)   Body mass index is 35.53 kg/m.           Physical Examination:   General appearance: alert, well appearing, and in no distress  Mental status: alert, oriented to person, place, and time  Skin: warm & dry   Extremities:      Cardiovascular: normal heart rate noted  Respiratory: normal respiratory effort, no distress  Abdomen: gravid, soft, non-tender  Pelvic: Cervical exam deferred         Fetal Status:     Movement: Present    Fetal Surveillance Testing today:   [name] is at [redacted]w[redacted]d Estimated Date of Delivery: 05/30/23  NST being performed due to A2/B DM  Today the NST is Reactive  Fetal Monitoring:  Baseline: 140 bpm, Variability: Good {> 6 bpm), Accelerations: Reactive, and Decelerations: Absent   reactive  The accelerations are >15 bpm and more than 2 in 20 minutes  Final diagnosis:  Reactive NST  Lazaro Arms, MD     Chaperone: N/A    No results found for this or any previous visit (from the past 24 hours).  Assessment & Plan:  High-risk pregnancy: [id number] at [redacted]w[redacted]d with an Estimated Date of Delivery: 05/30/23      ICD-10-CM   1. Supervision of high risk pregnancy, antepartum  O09.90     2. [redacted] weeks gestation of pregnancy  Z3A.37     3. Gestational diabetes mellitus, class A2  O24.419         Meds: No orders of the defined types were placed in this encounter.   Orders: No orders of the defined types were placed in this encounter.    Labs/procedures today: NST  Treatment Plan:  twice weekly surveillance, IOL 39 weeks    Follow-up: Return for keep scheduled.   Future Appointments  Date Time Provider Department Center  05/17/2023  3:00 PM [facility] IMG 2 [facility] None  05/17/2023  3:50 PM Myna Hidalgo, DO [facility]  05/20/2023  8:50 AM [facility]FT[facility]GYN NURSE [facility]  05/24/2023  8:50 AM [facility] NURSE [facility]  05/27/2023  9:15 AM [facility] - [facility]YN [place] [facility]FT[facility] None  05/27/2023 10:10 AM Lazaro Arms, MD [facility]    No  orders of the defined types were placed in this encounter.  Lazaro Arms  Attending Physician for the Center for [facility] 05/13/2023 9:35 AM

## 2023-05-15 ENCOUNTER — Encounter: Payer: Self-pay | Admitting: Obstetrics & Gynecology

## 2023-05-17 ENCOUNTER — Ambulatory Visit (INDEPENDENT_AMBULATORY_CARE_PROVIDER_SITE_OTHER): Payer: Medicaid Other | Admitting: Radiology

## 2023-05-17 ENCOUNTER — Ambulatory Visit: Payer: Medicaid Other | Admitting: Obstetrics & Gynecology

## 2023-05-17 ENCOUNTER — Encounter: Payer: Self-pay | Admitting: Obstetrics & Gynecology

## 2023-05-17 VITALS — BP 113/73 | HR 94 | Wt 205.0 lb

## 2023-05-17 DIAGNOSIS — O09523 Supervision of elderly multigravida, third trimester: Secondary | ICD-10-CM

## 2023-05-17 DIAGNOSIS — Z3A38 38 weeks gestation of pregnancy: Secondary | ICD-10-CM | POA: Diagnosis not present

## 2023-05-17 DIAGNOSIS — Z1389 Encounter for screening for other disorder: Secondary | ICD-10-CM | POA: Diagnosis not present

## 2023-05-17 DIAGNOSIS — O24419 Gestational diabetes mellitus in pregnancy, unspecified control: Secondary | ICD-10-CM

## 2023-05-17 DIAGNOSIS — Z331 Pregnant state, incidental: Secondary | ICD-10-CM

## 2023-05-17 DIAGNOSIS — O0990 Supervision of high risk pregnancy, unspecified, unspecified trimester: Secondary | ICD-10-CM

## 2023-05-17 DIAGNOSIS — O0993 Supervision of high risk pregnancy, unspecified, third trimester: Secondary | ICD-10-CM

## 2023-05-17 DIAGNOSIS — R768 Other specified abnormal immunological findings in serum: Secondary | ICD-10-CM

## 2023-05-17 LAB — POCT URINALYSIS DIPSTICK OB
Blood, UA: NEGATIVE
Glucose, UA: NEGATIVE
Leukocytes, UA: NEGATIVE
Nitrite, UA: NEGATIVE

## 2023-05-17 NOTE — Progress Notes (Signed)
 [place]:  GA = 38+1 weeks Single active female fetus, cephalic, FHR = 166 bpm,  posterior right lateral pl, high, gr1 AFI = 13.9 cm,  55%, MVP = 4 cm,   BPP = 8/8

## 2023-05-17 NOTE — Progress Notes (Signed)
 HIGH-RISK PREGNANCY VISIT Patient name: [name] MRN [id number]  Date of birth: December 30, 1985 Chief Complaint:   Routine Prenatal Visit and Pregnancy Ultrasound  History of Present Illness:   [name] is a 38 y.o. G58P5005 female at [redacted]w[redacted]d with an Estimated Date of Delivery: 05/30/23 being seen today for ongoing management of a high-risk pregnancy complicated by:  -Class B/A2  Well controlled with metformin, forgot log today -HSV asymptomatic on prophylaxis -h/o Chlamydia  Today she reports no complaints.   Contractions: Not present.  .  Movement: Present. denies leaking of fluid.      12/01/2022   11:17 AM 11/16/2022    3:20 PM 09/30/2022    9:09 AM 08/26/2021    2:54 PM 01/08/2019    4:03 PM  Depression screen PHQ 2/9  Decreased Interest 0 0 0 0 0  Down, Depressed, Hopeless 0 0 0 0 0  PHQ - 2 Score 0 0 0 0 0  Altered sleeping  0 0 0   Tired, decreased energy  0 0 0   Change in appetite  0 0 0   Feeling bad or failure about yourself   0 0 0   Trouble concentrating  0 0 0   Moving slowly or fidgety/restless  0 0 0   Suicidal thoughts  0 0 0   PHQ-9 Score  0 0 0      Current Outpatient Medications  Medication Instructions   Accu-Chek Softclix Lancets lancets Use as instructed to check blood sugar 4 times daily   aspirin EC 81 mg, Oral, Daily, Swallow whole.   Blood Glucose Monitoring Suppl (ACCU-CHEK GUIDE ME) w/Device KIT 1 each, Does not apply, 4 times daily   Doxylamine-Pyridoxine (DICLEGIS) 10-10 MG TBEC 2 tabs q hs, if sx persist add 1 tab q am on day 3, if sx persist add 1 tab q afternoon on day 4   glucose blood (ACCU-CHEK GUIDE) test strip Use as instructed to check blood sugar 4 times daily   metFORMIN (GLUCOPHAGE) 500 MG tablet TAKE 1 TABLET(500 MG) BY MOUTH TWICE DAILY WITH A MEAL   omeprazole (PRILOSEC) 20 mg, Oral, Daily, 1 tablet a day   ondansetron (ZOFRAN) 4 mg, Oral, Every 6 hours   Prenatal Vit-Fe Fumarate-FA (PRENATAL VITAMINS) 28-0.8 MG  TABS 1 tablet, Oral, Daily   promethazine (PHENERGAN) 25 mg, Oral, Every 6 hours PRN   valACYclovir (VALTREX) 500 mg, Oral, 2 times daily     Review of Systems:   Pertinent items are noted in HPI Denies abnormal vaginal discharge w/ itching/odor/irritation, headaches, visual changes, shortness of breath, chest pain, abdominal pain, severe nausea/vomiting, or problems with urination or bowel movements unless otherwise stated above. Pertinent History Reviewed:  Reviewed past medical,surgical, social, obstetrical and family history.  Reviewed problem list, medications and allergies. Physical Assessment:   Vitals:   05/17/23 1531  BP: 113/73  Pulse: 94  Weight: 205 lb (93 kg)  Body mass index is 35.19 kg/m.           Physical Examination:   General appearance: alert, well appearing, and in no distress  Mental status: normal mood, behavior, speech, dress, motor activity, and thought processes  Skin: warm & dry   Extremities: Edema: None    Cardiovascular: normal heart rate noted  Respiratory: normal respiratory effort, no distress  Abdomen: gravid, soft, non-tender  Pelvic: Cervical exam deferred         Fetal Status:     Movement: Present  Fetal Surveillance Testing today:  Single active female fetus, cephalic, FHR = 166 bpm,  posterior right lateral pl, high, gr1 AFI = 13.9 cm,  55%, MVP = 4 cm,   BPP = 8/8  Chaperone: N/A    Results for orders placed or performed in visit on 05/17/23 (from the past 24 hours)  POC Urinalysis Dipstick OB   Collection Time: 05/17/23  3:41 PM  Result Value Ref Range   Color, UA     Clarity, UA     Glucose, UA Negative Negative   Bilirubin, UA     Ketones, UA trace    Spec Grav, UA     Blood, UA negative    pH, UA     POC,PROTEIN,UA Trace Negative, Trace, Small (1+), Moderate (2+), Large (3+), 4+   Urobilinogen, UA     Nitrite, UA negative    Leukocytes, UA Negative Negative   Appearance     Odor       Assessment & Plan:   High-risk pregnancy: [id number] at [redacted]w[redacted]d with an Estimated Date of Delivery: 05/30/23   1. Gestational diabetes mellitus, class A2 No change to metformin BPP 8/8 today Scheduled for IOL next Monday Orders placed  2. HSV-2 seropositive Asymptomatic, on valtrex  3. Supervision of high risk pregnancy, antepartum (Primary)   4. [redacted] weeks gestation of pregnancy   5. Pregnant state, incidental  - POC Urinalysis Dipstick OB  6. Screening for genitourinary condition  - POC Urinalysis Dipstick OB   Meds: No orders of the defined types were placed in this encounter.   Labs/procedures today: BPP  Treatment Plan:  routine OB care, IOL scheduled for Monday  Reviewed: Term labor symptoms and general obstetric precautions including but not limited to vaginal bleeding, contractions, leaking of fluid and fetal movement were reviewed in detail with the patient.  All questions were answered. Pt has home bp cuff. Check bp weekly, let us know if >140/90.   Follow-up: Return for IOL 3/24.   Future Appointments  Date Time Provider Department Center  05/20/2023  8:50 AM [facility] NURSE [facility]  05/23/2023  6:30 AM [facility] SCHED ROOM [facility] None  05/24/2023  8:50 AM [facility] NURSE [facility]  05/27/2023  9:15 AM [facility] [place] [facility] None  05/27/2023 10:10 AM Eure, Amaryllis Dyke, MD [facility]    Orders Placed This Encounter  Procedures   POC Urinalysis Dipstick OB    Myna Hidalgo, DO Attending Obstetrician & Gynecologist, [facility] for [employer], [facility]

## 2023-05-18 ENCOUNTER — Telehealth (HOSPITAL_COMMUNITY): Payer: Self-pay | Admitting: *Deleted

## 2023-05-18 ENCOUNTER — Other Ambulatory Visit: Payer: Self-pay | Admitting: Advanced Practice Midwife

## 2023-05-18 ENCOUNTER — Encounter (HOSPITAL_COMMUNITY): Payer: Self-pay | Admitting: *Deleted

## 2023-05-18 NOTE — Telephone Encounter (Signed)
 Preadmission screen

## 2023-05-19 ENCOUNTER — Encounter: Payer: Medicaid Other | Admitting: Obstetrics & Gynecology

## 2023-05-19 ENCOUNTER — Other Ambulatory Visit: Payer: Medicaid Other

## 2023-05-20 ENCOUNTER — Other Ambulatory Visit: Payer: Medicaid Other

## 2023-05-23 ENCOUNTER — Encounter (HOSPITAL_COMMUNITY): Payer: Self-pay | Admitting: Obstetrics & Gynecology

## 2023-05-23 ENCOUNTER — Inpatient Hospital Stay (HOSPITAL_COMMUNITY)

## 2023-05-23 ENCOUNTER — Other Ambulatory Visit: Payer: Self-pay

## 2023-05-23 ENCOUNTER — Inpatient Hospital Stay (HOSPITAL_COMMUNITY)
Admission: RE | Admit: 2023-05-23 | Discharge: 2023-05-25 | DRG: 806 | Disposition: A | Discharge status: Home / Self Care | Attending: Family Medicine | Admitting: Family Medicine

## 2023-05-23 DIAGNOSIS — Z3A39 39 weeks gestation of pregnancy: Secondary | ICD-10-CM | POA: Diagnosis not present

## 2023-05-23 DIAGNOSIS — O0990 Supervision of high risk pregnancy, unspecified, unspecified trimester: Secondary | ICD-10-CM

## 2023-05-23 DIAGNOSIS — O99824 Streptococcus B carrier state complicating childbirth: Secondary | ICD-10-CM | POA: Diagnosis present

## 2023-05-23 DIAGNOSIS — O24919 Unspecified diabetes mellitus in pregnancy, unspecified trimester: Principal | ICD-10-CM | POA: Diagnosis present

## 2023-05-23 DIAGNOSIS — O99344 Other mental disorders complicating childbirth: Secondary | ICD-10-CM | POA: Diagnosis not present

## 2023-05-23 DIAGNOSIS — O9832 Other infections with a predominantly sexual mode of transmission complicating childbirth: Secondary | ICD-10-CM | POA: Diagnosis present

## 2023-05-23 DIAGNOSIS — A6000 Herpesviral infection of urogenital system, unspecified: Secondary | ICD-10-CM | POA: Diagnosis not present

## 2023-05-23 DIAGNOSIS — O24425 Gestational diabetes mellitus in childbirth, controlled by oral hypoglycemic drugs: Principal | ICD-10-CM | POA: Diagnosis present

## 2023-05-23 DIAGNOSIS — O99820 Streptococcus B carrier state complicating pregnancy: Secondary | ICD-10-CM | POA: Diagnosis not present

## 2023-05-23 DIAGNOSIS — Z87891 Personal history of nicotine dependence: Secondary | ICD-10-CM | POA: Diagnosis not present

## 2023-05-23 DIAGNOSIS — O24424 Gestational diabetes mellitus in childbirth, insulin controlled: Secondary | ICD-10-CM | POA: Diagnosis not present

## 2023-05-23 DIAGNOSIS — O24419 Gestational diabetes mellitus in pregnancy, unspecified control: Secondary | ICD-10-CM

## 2023-05-23 LAB — GLUCOSE, CAPILLARY
Glucose-Capillary: 117 mg/dL — ABNORMAL HIGH (ref 70–99)
Glucose-Capillary: 88 mg/dL (ref 70–99)
Glucose-Capillary: 80 mg/dL (ref 70–99)

## 2023-05-23 LAB — CBC
HCT: 35.2 % — ABNORMAL LOW (ref 36.0–46.0)
Hemoglobin: 11.6 g/dL — ABNORMAL LOW (ref 12.0–15.0)
MCH: 30.5 pg (ref 26.0–34.0)
MCHC: 33 g/dL (ref 30.0–36.0)
MCV: 92.6 fL (ref 80.0–100.0)
Platelets: 220 10*3/uL (ref 150–400)
RBC: 3.8 MIL/uL — ABNORMAL LOW (ref 3.87–5.11)
RDW: 14.1 % (ref 11.5–15.5)
WBC: 11.6 10*3/uL — ABNORMAL HIGH (ref 4.0–10.5)
nRBC: 0 % (ref 0.0–0.2)

## 2023-05-23 LAB — TYPE AND SCREEN
ABO/RH(D): A POS
Antibody Screen: NEGATIVE

## 2023-05-23 LAB — RPR: RPR Ser Ql: NONREACTIVE

## 2023-05-23 MED ORDER — TETANUS-DIPHTH-ACELL PERTUSSIS 5-2.5-18.5 LF-MCG/0.5 IM SUSY: 0.5000 mL | PREFILLED_SYRINGE | Freq: Once | INTRAMUSCULAR | Status: DC

## 2023-05-23 MED ORDER — ZOLPIDEM TARTRATE 5 MG PO TABS: 5.0000 mg | ORAL_TABLET | Freq: Every evening | ORAL | Status: DC | PRN

## 2023-05-23 MED ORDER — LIDOCAINE HCL (PF) 1 % IJ SOLN: 30.0000 mL | INTRAMUSCULAR | Status: DC | PRN

## 2023-05-23 MED ORDER — PRENATAL MULTIVITAMIN CH
1.0000 | ORAL_TABLET | Freq: Every day | ORAL | Status: DC
  Administered 2023-05-24 – 2023-05-25 (×2): 1 via ORAL
  Filled 2023-05-23 (×2): qty 1

## 2023-05-23 MED ORDER — ONDANSETRON HCL 4 MG PO TABS: 4.0000 mg | ORAL_TABLET | ORAL | Status: DC | PRN

## 2023-05-23 MED ORDER — ONDANSETRON HCL 4 MG/2ML IJ SOLN: 4.0000 mg | Freq: Four times a day (QID) | INTRAMUSCULAR | Status: DC | PRN

## 2023-05-23 MED ORDER — BENZOCAINE-MENTHOL 20-0.5 % EX AERO: 1.0000 | INHALATION_SPRAY | CUTANEOUS | Status: DC | PRN

## 2023-05-23 MED ORDER — DIBUCAINE (PERIANAL) 1 % EX OINT: 1.0000 | TOPICAL_OINTMENT | CUTANEOUS | Status: DC | PRN

## 2023-05-23 MED ORDER — OXYCODONE-ACETAMINOPHEN 5-325 MG PO TABS
2.0000 | ORAL_TABLET | ORAL | Status: DC | PRN
  Administered 2023-05-23: 2 via ORAL
  Filled 2023-05-23: qty 2

## 2023-05-23 MED ORDER — IBUPROFEN 600 MG PO TABS
600.0000 mg | ORAL_TABLET | Freq: Four times a day (QID) | ORAL | Status: DC
  Administered 2023-05-23 – 2023-05-25 (×7): 600 mg via ORAL
  Filled 2023-05-23 (×8): qty 1

## 2023-05-23 MED ORDER — PANTOPRAZOLE SODIUM 40 MG PO TBEC
40.0000 mg | DELAYED_RELEASE_TABLET | Freq: Every day | ORAL | Status: DC
  Filled 2023-05-23: qty 1

## 2023-05-23 MED ORDER — SODIUM CHLORIDE 0.9 % IV SOLN
5.0000 10*6.[IU] | Freq: Once | INTRAVENOUS | Status: AC
  Administered 2023-05-23: 5 10*6.[IU] via INTRAVENOUS
  Filled 2023-05-23: qty 5

## 2023-05-23 MED ORDER — SENNOSIDES-DOCUSATE SODIUM 8.6-50 MG PO TABS
2.0000 | ORAL_TABLET | Freq: Every day | ORAL | Status: DC
  Administered 2023-05-24 – 2023-05-25 (×2): 2 via ORAL
  Filled 2023-05-23 (×2): qty 2

## 2023-05-23 MED ORDER — OXYTOCIN BOLUS FROM INFUSION
333.0000 mL | Freq: Once | INTRAVENOUS | Status: AC
  Administered 2023-05-23: 333 mL via INTRAVENOUS

## 2023-05-23 MED ORDER — ACETAMINOPHEN 325 MG PO TABS: 650.0000 mg | ORAL_TABLET | ORAL | Status: DC | PRN

## 2023-05-23 MED ORDER — OXYTOCIN-SODIUM CHLORIDE 30-0.9 UT/500ML-% IV SOLN
1.0000 m[IU]/min | INTRAVENOUS | Status: DC
  Administered 2023-05-23: 2 m[IU]/min via INTRAVENOUS

## 2023-05-23 MED ORDER — WITCH HAZEL-GLYCERIN EX PADS: 1.0000 | MEDICATED_PAD | CUTANEOUS | Status: DC | PRN

## 2023-05-23 MED ORDER — DIPHENHYDRAMINE HCL 25 MG PO CAPS: 25.0000 mg | ORAL_CAPSULE | Freq: Four times a day (QID) | ORAL | Status: DC | PRN

## 2023-05-23 MED ORDER — LACTATED RINGERS IV SOLN: INTRAVENOUS | Status: DC

## 2023-05-23 MED ORDER — PENICILLIN G POT IN DEXTROSE 60000 UNIT/ML IV SOLN
3.0000 10*6.[IU] | INTRAVENOUS | Status: DC
  Administered 2023-05-23: 3 10*6.[IU] via INTRAVENOUS
  Filled 2023-05-23 (×2): qty 50

## 2023-05-23 MED ORDER — LACTATED RINGERS IV SOLN: 500.0000 mL | INTRAVENOUS | Status: DC | PRN

## 2023-05-23 MED ORDER — TERBUTALINE SULFATE 1 MG/ML IJ SOLN: 0.2500 mg | Freq: Once | INTRAMUSCULAR | Status: DC | PRN

## 2023-05-23 MED ORDER — MISOPROSTOL 50MCG HALF TABLET
50.0000 ug | ORAL_TABLET | Freq: Once | ORAL | Status: AC
  Administered 2023-05-23: 50 ug via ORAL
  Filled 2023-05-23: qty 1

## 2023-05-23 MED ORDER — OXYCODONE-ACETAMINOPHEN 5-325 MG PO TABS: 1.0000 | ORAL_TABLET | ORAL | Status: DC | PRN

## 2023-05-23 MED ORDER — OXYCODONE HCL 5 MG PO TABS
5.0000 mg | ORAL_TABLET | ORAL | Status: DC | PRN
  Administered 2023-05-24 (×3): 5 mg via ORAL
  Filled 2023-05-23 (×3): qty 1

## 2023-05-23 MED ORDER — SIMETHICONE 80 MG PO CHEW: 80.0000 mg | CHEWABLE_TABLET | ORAL | Status: DC | PRN

## 2023-05-23 MED ORDER — COCONUT OIL OIL: 1.0000 | TOPICAL_OIL | Status: DC | PRN

## 2023-05-23 MED ORDER — OXYTOCIN-SODIUM CHLORIDE 30-0.9 UT/500ML-% IV SOLN
2.5000 [IU]/h | INTRAVENOUS | Status: DC
  Administered 2023-05-23: 2.5 [IU]/h via INTRAVENOUS
  Filled 2023-05-23: qty 500

## 2023-05-23 MED ORDER — OXYCODONE HCL 5 MG PO TABS: 10.0000 mg | ORAL_TABLET | ORAL | Status: DC | PRN

## 2023-05-23 MED ORDER — SOD CITRATE-CITRIC ACID 500-334 MG/5ML PO SOLN: 30.0000 mL | ORAL | Status: DC | PRN

## 2023-05-23 MED ORDER — FENTANYL CITRATE (PF) 100 MCG/2ML IJ SOLN
50.0000 ug | INTRAMUSCULAR | Status: DC | PRN
  Administered 2023-05-23 (×3): 100 ug via INTRAVENOUS
  Filled 2023-05-23 (×4): qty 2

## 2023-05-23 MED ORDER — ONDANSETRON HCL 4 MG/2ML IJ SOLN: 4.0000 mg | INTRAMUSCULAR | Status: DC | PRN
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
## 2023-05-23 NOTE — Progress Notes (Signed)
 [name] is a 38 y.o. G6P5005 at [redacted]w[redacted]d by ultrasound admitted for induction of labor due to Gestational diabetes. Hx of HVS on Valtrex.   Subjective: Introductions exchanged. Patient doing well. Excited, but states that she believes that this baby will come faster than her previous.   Objective: BP 123/76   Pulse (!) 102   Temp 98.5 F (36.9 C) (Oral)   Resp 16   Ht 5\' 4"  (1.626 m)   Wt 92.5 kg   LMP  (LMP Unknown)   BMI 35.02 kg/m  No intake/output data recorded. No intake/output data recorded.  FHT:  FHR: 125 bpm, variability: moderate,  accelerations:  Present,  decelerations:  Absent UC:   irregular, every 2-7 minutes SVE:   Dilation: 1 Effacement (%): 80 Station: -2 Exam by:: Huntley Dec CNM  Labs: Lab Results  Component Value Date   WBC 11.6 (H) 05/23/2023   HGB 11.6 (L) 05/23/2023   HCT 35.2 (L) 05/23/2023   MCV 92.6 05/23/2023   PLT 220 05/23/2023   CBG (last 3)  Recent Labs    05/23/23 0815  GLUCAP 117*    Assessment / Plan: Induction of labor due to gestational diabetes.   Labor:  FB in place. Once expelled, plan for Pit and AROM if needed.  CBG: stable at this time  Fetal Wellbeing:  Category I Pain Control:  Labor support without medications I/D:   GBS positive on PCN  Anticipated MOD:  NSVD at this time.   Claudette Head, CNM 05/23/2023, 9:05 AM

## 2023-05-23 NOTE — Progress Notes (Signed)
 [name] is a 38 y.o. G6P5005 at [redacted]w[redacted]d by ultrasound admitted for induction of labor due to Gestational diabetes.  Subjective: Patient doing well. Feeling more intense contractions.   Objective: BP (!) 117/91   Pulse 90   Temp 98.2 F (36.8 C) (Oral)   Resp 18   Ht 5\' 4"  (1.626 m)   Wt 92.5 kg   LMP  (LMP Unknown)   BMI 35.02 kg/m  No intake/output data recorded. No intake/output data recorded.  FHT:  FHR: 135 bpm, variability: moderate,  accelerations:  Present,  decelerations:  Absent UC:   regular, every 3-7 minutes SVE:   Dilation: 6 Effacement (%): 70 Station: -3 Exam by:: S. Oklesh  Labs: Lab Results  Component Value Date   WBC 11.6 (H) 05/23/2023   HGB 11.6 (L) 05/23/2023   HCT 35.2 (L) 05/23/2023   MCV 92.6 05/23/2023   PLT 220 05/23/2023  CBG (last 3)  Recent Labs    05/23/23 0815 05/23/23 1233 05/23/23 1620  GLUCAP 117* 88 80   CNM to patient bedside to discuss AROM. Reviewed risks and benefits with patient. Patient desires to proceed with AROM. FHT Cat 1 prior to AROM. SVE 4/80/-2/-3 S. Suzie Portela CNM. Fetal head well applied to cervix, AROM successful on first attempt with the use of amnihook. Clear fluid noted. Patient and fetus tolerated well and FHT remained Cat 1 following AROM.    Assessment / Plan: Induction of labor due to gestational diabetes,  progressing well s/p FB expulsion.   Labor:  AROM and progress to Pit 2x2 as needed Fetal Wellbeing:  Category I Pain Control:  IV pain meds I/D:   GBS Positive < PCN  Anticipated MOD:  NSVD  Claudette Head, CNM 05/23/2023, 4:40 PM

## 2023-05-23 NOTE — Lactation Note (Signed)
 This note was copied from a baby's chart. Lactation Consultation Note  Patient Name: [name] Date: 05/23/2023 Age:39 hours Reason for consult: Initial assessment  P6- MOB is formula feeding only. Please let LC team know if MOB is needing LC services at any time.  Feeding Mother's Current Feeding Choice: Formula  Consult Status Consult Status: Complete Date: 05/23/23    Dema Severin BS, IBCLC 05/23/2023, 6:59 PM

## 2023-05-24 ENCOUNTER — Other Ambulatory Visit: Payer: Medicaid Other

## 2023-05-24 ENCOUNTER — Other Ambulatory Visit: Payer: Medicaid Other | Admitting: Radiology

## 2023-05-24 MED ORDER — ACETAMINOPHEN 500 MG PO TABS
1000.0000 mg | ORAL_TABLET | Freq: Four times a day (QID) | ORAL | Status: DC
  Administered 2023-05-24 – 2023-05-25 (×4): 1000 mg via ORAL
  Filled 2023-05-24 (×4): qty 2

## 2023-05-24 NOTE — Social Work (Signed)
 MOB was referred for history of depression/anxiety.  * Referral screened out by Clinical Social Worker because none of the following criteria appear to apply:  ~ History of anxiety/depression during this pregnancy, or of post-partum depression following prior delivery.  ~ Diagnosis of anxiety and/or depression within last 3 years OR * MOB's symptoms currently being treated with medication and/or therapy.  Per chart review MOB was diagnosed in 2015, per OB records, no MH concerns noted during this pregnancy. Edinburgh =0  Please contact the Clinical Social Worker if needs arise, or by MOB request.  Wende Neighbors, LCSWA Clinical Social Worker [phone]

## 2023-05-24 NOTE — Progress Notes (Signed)
 POSTPARTUM PROGRESS NOTE  Subjective: [name] is a 38 y.o. [id number] s/p SVD at [redacted]w[redacted]d.  She reports she doing well. No acute events overnight. She denies any problems with ambulating, voiding or po intake. Denies nausea or vomiting. She has passed flatus. Pain is well controlled.  Lochia is appropriate.  Objective: Blood pressure 109/63, pulse 88, temperature 98.9 F (37.2 C), temperature source Oral, resp. rate 18, height 5\' 4"  (1.626 m), weight 92.5 kg, SpO2 99%, unknown if currently breastfeeding.  Physical Exam:  General: alert, cooperative and no distress Chest: no respiratory distress Abdomen: soft, non-tender  Uterine Fundus: firm and at level of umbilicus Extremities: No calf swelling or tenderness  no edema  Recent Labs    05/23/23 0723  HGB 11.6*  HCT 35.2*    Assessment/Plan: [name] is a 38 y.o. [id number] s/p SVD at [redacted]w[redacted]d for IOL for A2GDM. Marland Kitchen  Routine Postpartum Care: Doing well, pain well-controlled.  -- Continue routine care, lactation support  -- Contraception: COCs -- Feeding: Bottle feeding --A2GDM: discontinue metformin   Dispo: Plan for discharge tomorrow.  Denton Ar, MD Faculty Practice, [facility] 05/24/2023 6:24 AM

## 2023-05-25 MED ORDER — ACETAMINOPHEN 500 MG PO TABS: 1000.0000 mg | ORAL_TABLET | Freq: Four times a day (QID) | ORAL | 0 refills | Status: DC | PRN

## 2023-05-25 MED ORDER — NORETHINDRONE 0.35 MG PO TABS: 1.0000 | ORAL_TABLET | Freq: Every day | ORAL | 3 refills | Status: DC

## 2023-05-25 MED ORDER — IBUPROFEN 600 MG PO TABS: 600.0000 mg | ORAL_TABLET | Freq: Four times a day (QID) | ORAL | 0 refills | Status: AC | PRN

## 2023-05-25 MED ORDER — SENNOSIDES-DOCUSATE SODIUM 8.6-50 MG PO TABS: 2.0000 | ORAL_TABLET | Freq: Every evening | ORAL | 0 refills | Status: DC | PRN

## 2023-05-26 ENCOUNTER — Encounter: Payer: Medicaid Other | Admitting: Obstetrics & Gynecology

## 2023-05-26 ENCOUNTER — Other Ambulatory Visit: Payer: Medicaid Other

## 2023-05-27 ENCOUNTER — Other Ambulatory Visit: Payer: Medicaid Other

## 2023-05-27 ENCOUNTER — Encounter: Payer: Medicaid Other | Admitting: Obstetrics & Gynecology

## 2023-06-04 ENCOUNTER — Telehealth (HOSPITAL_COMMUNITY): Payer: Self-pay

## 2023-06-04 NOTE — Telephone Encounter (Signed)
 06/04/2023 1245  Name: [name] MRN: [id number] DOB: 1985-09-29  Reason for Call:  Transition of Care Hospital Discharge Call  Contact Status: Patient Contact Status: Complete  Language assistant needed:          Follow-Up Questions: Do You Have Any Concerns About Your Health As You Heal From Delivery?: No Do You Have Any Concerns About Your Infants Health?: No  Edinburgh Postnatal Depression Scale:  In the Past 7 Days: I have been able to laugh and see the funny side of things.: As much as I always could I have looked forward with enjoyment to things.: As much as I ever did I have blamed myself unnecessarily when things went wrong.: No, never I have been anxious or worried for no good reason.: No, not at all I have felt scared or panicky for no good reason.: No, not at all Things have been getting on top of me.: No, I have been coping as well as ever I have been so unhappy that I have had difficulty sleeping.: Not at all I have felt sad or miserable.: No, not at all I have been so unhappy that I have been crying.: No, never The thought of harming myself has occurred to me.: Never Edinburgh Postnatal Depression Scale Total: 0  PHQ2-9 Depression Scale:     Discharge Follow-up: Edinburgh score requires follow up?: No Patient was advised of the following resources:: Breastfeeding Support Group, Support Group  Post-discharge interventions: NA  Signature  Signe Colt

## 2023-06-29 ENCOUNTER — Other Ambulatory Visit

## 2023-06-29 ENCOUNTER — Ambulatory Visit: Admitting: Advanced Practice Midwife

## 2023-06-29 DIAGNOSIS — Z8632 Personal history of gestational diabetes: Secondary | ICD-10-CM

## 2023-06-29 DIAGNOSIS — Z131 Encounter for screening for diabetes mellitus: Secondary | ICD-10-CM

## 2023-07-04 ENCOUNTER — Encounter: Payer: Self-pay | Admitting: Advanced Practice Midwife

## 2023-07-04 ENCOUNTER — Other Ambulatory Visit (HOSPITAL_COMMUNITY)
Admission: RE | Admit: 2023-07-04 | Discharge: 2023-07-04 | Disposition: A | Discharge status: Home / Self Care | Source: Ambulatory Visit | Attending: Advanced Practice Midwife | Admitting: Advanced Practice Midwife

## 2023-07-04 ENCOUNTER — Ambulatory Visit: Admitting: Advanced Practice Midwife

## 2023-07-04 DIAGNOSIS — N898 Other specified noninflammatory disorders of vagina: Secondary | ICD-10-CM | POA: Insufficient documentation

## 2023-07-04 DIAGNOSIS — Z8632 Personal history of gestational diabetes: Secondary | ICD-10-CM | POA: Diagnosis not present

## 2023-07-04 MED ORDER — NORETHIN ACE-ETH ESTRAD-FE 1-20 MG-MCG PO TABS: 1.0000 | ORAL_TABLET | Freq: Every day | ORAL | 11 refills | Status: AC

## 2023-07-04 NOTE — Progress Notes (Signed)
 POSTPARTUM VISIT Patient name: [name] MRN [id number]  Date of birth: 1985/03/07 Chief Complaint:   Postpartum Care  History of Present Illness:   [name] is a 38 y.o. [id number] African American female being seen today for a postpartum visit. She is 6 weeks postpartum following a spontaneous vaginal delivery at 39.0 gestational weeks. IOL: yes, for diabetes mellitus A2DM . Anesthesia: none.  Laceration: none.  Complications: none. Inpatient contraception: no.   Pregnancy complicated by A2GDM; hx HSV; AMA . Tobacco use: former . Substance use disorder: no. Last pap smear: June 2023 and results were NILM w/ HRHPV negative. Next pap smear due: June 2028 No LMP recorded (lmp unknown).  Postpartum course has been uncomplicated. Bleeding none. Bowel function is normal. Bladder function is normal. Urinary incontinence? no, fecal incontinence? no Patient is sexually active. Last sexual activity:  3d ago . Desired contraception: COCs. Patient does not want a pregnancy in the future.  Desired family size is 6 children.   Upstream - 07/04/23 1553       Pregnancy Intention Screening   Does the patient want to become pregnant in the next year? No    Does the patient's partner want to become pregnant in the next year? No    Would the patient like to discuss contraceptive options today? No      Contraception Wrap Up   Current Method Oral Contraceptive    End Method Oral Contraceptive    Contraception Counseling Provided Yes            The pregnancy intention screening data noted above was reviewed. Potential methods of contraception were discussed. The patient elected to proceed with Oral Contraceptive.  Edinburgh Postpartum Depression Screening: negative  Edinburgh Postnatal Depression Scale - 07/04/23 1450       Edinburgh Postnatal Depression Scale:  In the Past 7 Days   I have been able to laugh and see the funny side of things. 0    I have looked forward with  enjoyment to things. 0    I have blamed myself unnecessarily when things went wrong. 0    I have been anxious or worried for no good reason. 0    I have felt scared or panicky for no good reason. 0    Things have been getting on top of me. 0    I have been so unhappy that I have had difficulty sleeping. 0    I have felt sad or miserable. 0    I have been so unhappy that I have been crying. 0    The thought of harming myself has occurred to me. 0    Edinburgh Postnatal Depression Scale Total 0                11/16/2022    3:20 PM 09/30/2022    9:09 AM 08/26/2021    2:55 PM  GAD 7 : Generalized Anxiety Score  Nervous, Anxious, on Edge 0 0 0  Control/stop worrying 0 0 0  Worry too much - different things 0 0 0  Trouble relaxing 0 0 0  Restless 0 0 0  Easily annoyed or irritable 0 0 0  Afraid - awful might happen 0 0 0  Total GAD 7 Score 0 0 0     Baby's course has been uncomplicated. Baby is feeding by bottle. Infant has a pediatrician/family doctor? Yes.  Childcare strategy if returning to work/school: n/a-stay at home mom.  Pt has material needs  met for her and baby: Yes.   Review of Systems:   Pertinent items are noted in HPI Denies Abnormal vaginal discharge w/ itching/odor/irritation, headaches, visual changes, shortness of breath, chest pain, abdominal pain, severe nausea/vomiting, or problems with urination or bowel movements. Pertinent History Reviewed:  Reviewed past medical,surgical, obstetrical and family history.  Reviewed problem list, medications and allergies. OB History  Gravida Para Term Preterm AB Living  6 6 6   6   SAB IAB Ectopic Multiple Live Births     0 6    # Outcome Date GA Lbr Len/2nd Weight Sex Type Anes PTL Lv  6 Term 05/23/23 [redacted]w[redacted]d 07:30 6 lb 8.4 oz (2.96 kg) F Vag-Spont None  LIV  5 Term 04/21/17 [redacted]w[redacted]d 15:20 / 00:10 7 lb 3.5 oz (3.275 kg) M Vag-Spont None  LIV     Complications: Gestational diabetes  4 Term 01/16/14 [redacted]w[redacted]d 05:55 / 00:08 7 lb 5  oz (3.317 kg) F Vag-Spont None N LIV     Birth Comments: Neg Hep B, HIV, RPR, +GBS Observed for 48 hrs b/o GBS Hx of maternal THC use, but neg urine and meconiium drug screen on baby No jaundice, no feeding or respiratory problems  3 Term 11/26/09 [redacted]w[redacted]d  8 lb (3.629 kg) M Vag-Spont None N LIV  2 Term 09/09/05 [redacted]w[redacted]d  8 lb (3.629 kg) F Vag-Spont None N LIV  1 Term 04/08/04 [redacted]w[redacted]d  8 lb (3.629 kg) M Vag-Spont None N LIV   Physical Assessment:   Vitals:   07/04/23 1445  BP: 115/68  Pulse: 63  Weight: 194 lb 6.4 oz (88.2 kg)  Height: 5\' 4"  (1.626 m)  Body mass index is 33.37 kg/m.       Physical Examination:   General appearance: alert, well appearing, and in no distress  Mental status: alert, oriented to person, place, and time  Skin: warm & dry   Cardiovascular: normal heart rate noted   Respiratory: normal respiratory effort, no distress   Breasts: deferred, no complaints   Abdomen: soft, non-tender   Pelvic: examination not indicated. Thin prep pap obtained: No  Rectal: not examined  Extremities: Edema: none         No results found for this or any previous visit (from the past 24 hours).  Assessment & Plan:  1) Postpartum exam 2) Six wks s/p spontaneous vaginal delivery 3) bottle feeding 4) Depression screening: negative 5) Contraception management: rx Junel to start ASAP (used condoms w sex on 5/2); condoms x first pack; f/u in 3 months prn 6) A2GDM: declines postpartum GTT; rev'd making PCP aware due to needing routine DM screening; reviewed following pre-diabetic diet as well as reg exercise 7) Vag odor: CV swab for yeast/BV  Essential components of care per ACOG recommendations:  1.  Mood and well being:  If positive depression screen, discussed and plan developed.  If using tobacco we discussed reduction/cessation and risk of relapse If current substance abuse, we discussed and referral to local resources was offered.   2. Infant care and feeding:  If  breastfeeding, discussed returning to work, pumping, breastfeeding-associated pain, guidance regarding return to fertility while lactating if not using another method. If needed, patient was provided with a letter to be allowed to pump q 2-3hrs to support lactation in a private location with access to a refrigerator to store breastmilk.   Recommended that all caregivers be immunized for flu, pertussis and other preventable communicable diseases If pt does not have material needs met  for her/baby, referred to local resources for help obtaining these.  3. Sexuality, contraception and birth spacing Provided guidance regarding sexuality, management of dyspareunia, and resumption of intercourse Discussed avoiding interpregnancy interval <10mths and recommended birth spacing of 18 months  4. Sleep and fatigue Discussed coping options for fatigue and sleep disruption Encouraged family/partner/community support of 4 hrs of uninterrupted sleep to help with mood and fatigue  5. Physical recovery  If pt had a C/S, assessed incisional pain and providing guidance on normal vs prolonged recovery If pt had a laceration, perineal healing and pain reviewed.  If urinary or fecal incontinence, discussed management and referred to PT or uro/gyn if indicated  Patient is safe to resume physical activity. Discussed attainment of healthy weight.  6.  Chronic disease management Discussed pregnancy complications if any, and their implications for future childbearing and long-term maternal health. Review recommendations for prevention of recurrent pregnancy complications, such as 17 hydroxyprogesterone caproate to reduce risk for recurrent PTB not applicable, or aspirin  to reduce risk of preeclampsia not applicable. Pt had GDM: yes. If yes, 2hr GTT scheduled: declines. Reviewed medications and non-pregnant dosing including consideration of whether pt is breastfeeding using a reliable resource such as LactMed: not  applicable Referred for f/u w/ PCP or subspecialist providers as indicated: no  7. Health maintenance Mammogram at 40yo or earlier if indicated Pap smears as indicated  Meds:  Meds ordered this encounter  Medications   norethindrone -ethinyl estradiol -FE (JUNEL FE 1/20) 1-20 MG-MCG tablet    Sig: Take 1 tablet by mouth daily.    Dispense:  28 tablet    Refill:  11    Supervising Provider:   Keene Pastures [[id number]]    Follow-up: Return for 3 month med f/u; 46yr physical.   No orders of the defined types were placed in this encounter.   Jolayne Natter CNM 07/04/2023 3:53 PM

## 2023-07-06 ENCOUNTER — Encounter: Payer: Self-pay | Admitting: Advanced Practice Midwife

## 2023-07-06 ENCOUNTER — Other Ambulatory Visit: Payer: Self-pay | Admitting: Advanced Practice Midwife

## 2023-07-06 DIAGNOSIS — B9689 Other specified bacterial agents as the cause of diseases classified elsewhere: Secondary | ICD-10-CM

## 2023-07-06 LAB — CERVICOVAGINAL ANCILLARY ONLY
Bacterial Vaginitis (gardnerella): POSITIVE — AB
Candida Glabrata: NEGATIVE
Candida Vaginitis: NEGATIVE
Comment: NEGATIVE
Comment: NEGATIVE
Comment: NEGATIVE

## 2023-07-06 MED ORDER — METRONIDAZOLE 500 MG PO TABS: 500.0000 mg | ORAL_TABLET | Freq: Two times a day (BID) | ORAL | 0 refills | Status: DC

## 2023-08-24 ENCOUNTER — Other Ambulatory Visit: Payer: Self-pay | Admitting: Adult Health

## 2023-08-24 MED ORDER — METRONIDAZOLE 500 MG PO TABS: 500.0000 mg | ORAL_TABLET | Freq: Two times a day (BID) | ORAL | 0 refills | Status: DC

## 2023-08-24 MED ORDER — FLUCONAZOLE 150 MG PO TABS: ORAL_TABLET | ORAL | 1 refills | Status: AC

## 2023-08-24 NOTE — Progress Notes (Signed)
Rx diflucan and flagyl

## 2023-10-24 ENCOUNTER — Ambulatory Visit
Admission: EM | Admit: 2023-10-24 | Discharge: 2023-10-24 | Disposition: A | Discharge status: Home / Self Care | Attending: Nurse Practitioner | Admitting: Nurse Practitioner

## 2023-10-24 DIAGNOSIS — G8929 Other chronic pain: Secondary | ICD-10-CM | POA: Diagnosis not present

## 2023-10-24 DIAGNOSIS — M545 Low back pain, unspecified: Secondary | ICD-10-CM | POA: Diagnosis not present

## 2023-10-24 DIAGNOSIS — R109 Unspecified abdominal pain: Secondary | ICD-10-CM | POA: Diagnosis not present

## 2023-10-24 DIAGNOSIS — Z113 Encounter for screening for infections with a predominantly sexual mode of transmission: Secondary | ICD-10-CM | POA: Diagnosis not present

## 2023-10-24 LAB — POCT URINE DIPSTICK
Bilirubin, UA: NEGATIVE
Blood, UA: NEGATIVE
Glucose, UA: NEGATIVE mg/dL
Ketones, POC UA: NEGATIVE mg/dL
Nitrite, UA: NEGATIVE
Protein Ur, POC: NEGATIVE mg/dL
Spec Grav, UA: 1.025 (ref 1.010–1.025)
Urobilinogen, UA: 0.2 U/dL
pH, UA: 6.5 (ref 5.0–8.0)

## 2023-10-24 MED ORDER — KETOROLAC TROMETHAMINE 30 MG/ML IJ SOLN
30.0000 mg | Freq: Once | INTRAMUSCULAR | Status: AC
  Administered 2023-10-24: 30 mg via INTRAMUSCULAR

## 2023-10-24 MED ORDER — DEXAMETHASONE SODIUM PHOSPHATE 10 MG/ML IJ SOLN
10.0000 mg | INTRAMUSCULAR | Status: AC
  Administered 2023-10-24: 10 mg via INTRAMUSCULAR

## 2023-10-24 MED ORDER — PREDNISONE 20 MG PO TABS: 40.0000 mg | ORAL_TABLET | Freq: Every day | ORAL | 0 refills | Status: AC

## 2023-10-24 NOTE — ED Triage Notes (Signed)
 Patient presents to the office for lower back and abdominal pain x 1 week. Denies any fever or vomiting.  Patient would like her urine check and STI testing.

## 2023-10-24 NOTE — Discharge Instructions (Addendum)
 For your back pain: You were given injections of Decadron  10 mg and Toradol  30 mg.  Do not take any additional NSAIDs today such as ibuprofen , Aleve , Motrin , Advil , or naproxen .  Recommend Tylenol  for breakthrough pain or discomfort. A urine culture has been ordered.  You will be contacted if the pending test results are abnormal.  You also have access to your results via MyChart. Take medication as prescribed.  Continue to take Tylenol  while you are taking the prednisone . Try to remain as active as possible. Gentle range of motion and stretching exercises to help with back spasm and pain. May apply ice or heat as needed.  Ice is recommended for pain or swelling, heat for spasm or stiffness.  Apply for 20 minutes, remove for 1 hour, then repeat. Go to the emergency department immediately if you develop weakness in your legs or feet, inability to walk, loss of bowel or bladder function, difficulty urinating or passing a bowel movement, or other concerns. As discussed, if symptoms fail to improve, recommend follow-up with your primary care physician.  Also recommend discussing referral to orthopedics for further evaluation.  For your STI testing: You will be contacted if the pending test results are abnormal.  You also have access to your results via MyChart. Increase condom use with each sexual encounter. If your test results are positive, you will need to notify all partners.  If treatment is necessary, you will need to refrain from sexual intercourse for an additional 7 days after completing treatment.   Follow-up as needed.

## 2023-10-24 NOTE — ED Provider Notes (Signed)
 [facility]    CSN: [id number] Arrival date & time: 10/24/23  0859      History   Chief Complaint Chief Complaint  Patient presents with   Back Pain   Abdominal Pain    HPI [name] is a 37 y.o. female.   The history is provided by the patient.   Patient presents for complaints of low back pain.  Patient reports history of same.  She states I think my back has gone out.  Patient states this is a chronic condition for her.  Patient states the pain is in the lower midsection of her back.  She complains of pain with certain movement, rates pain 9/10 at present.  Patient states so far, she has been taking over-the-counter medications for her symptoms.  She denies injury, trauma, numbness, tingling, radiation of pain, loss of bowel or bladder function, or saddle anesthesia.  Patient is also requesting STI testing.  She denies vaginal discharge, vaginal odor, or vaginal itching.  Past Medical History:  Diagnosis Date   Anxiety    Chlamydia 07/13/2021   07/13/21 treated with doxycycline  100 mg 1 bid x 7 days, POC  10/28/22 treated with Azithromycin  1 gm   POC 11/16/22 -/-     Depression    Gestational diabetes    History of gestational diabetes 02/15/2017   A2DM   2hr pp GTT normal     HSV infection    Irregular bleeding 01/17/2013   S/P cholecystectomy 06/19/2013   June 2014     Trichimoniasis 07/13/2021   07/13/21 treated with flagyl , POC.   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    Past Surgical History:  Procedure Laterality Date   CHOLECYSTECTOMY N/A 09/05/2012   Procedure: LAPAROSCOPIC CHOLECYSTECTOMY;  Surgeon: Donnice Bury, MD;  Location: [facility] OR;   Service: General;  Laterality: N/A;   ERCP N/A 09/04/2012   Procedure: ENDOSCOPIC RETROGRADE CHOLANGIOPANCREATOGRAPHY (ERCP);  Surgeon: Toribio SHAUNNA Cedar, MD;  Location: [facility] OR;  Service: Endoscopy;  Laterality: N/A;   PILONIDAL CYST EXCISION N/A 01/04/2018   Procedure: CYST EXCISION PILONIDAL SIMPLE;  Surgeon: Mavis Anes, MD;  Location: [facility] ORS;  Service: General;  Laterality: N/A;  low back   WISDOM TOOTH EXTRACTION      OB History     Gravida  6   Para  6   Term  6   Preterm      AB      Living  6      SAB      IAB      Ectopic      Multiple  0   Live Births  6            Home Medications    Prior to Admission medications   Medication Sig Start Date End Date Taking? Authorizing Provider  predniSONE  (DELTASONE ) 20 MG tablet Take 2 tablets (40 mg total) by mouth daily with breakfast for 5 days. 10/24/23 10/29/23 Yes Leath-Warren, Etta JINNY, NP  fluconazole  (DIFLUCAN ) 150 MG tablet Take 1 now and 1 in 3 days if needed 08/24/23   Signa Delon LABOR, NP  ibuprofen  (ADVIL ) 600 MG tablet Take 1  tablet (600 mg total) by mouth every 6 (six) hours as needed. 05/25/23   Erik Kieth BROCKS, MD  metroNIDAZOLE  (FLAGYL ) 500 MG tablet Take 1 tablet (500 mg total) by mouth 2 (two) times daily. 08/24/23   Signa Delon LABOR, NP  norethindrone -ethinyl estradiol -FE (JUNEL FE 1/20) 1-20 MG-MCG tablet Take 1 tablet by mouth daily. 07/04/23   Loreli Suzen BIRCH, CNM    Family History Family History  Problem Relation Age of Onset   CAD Maternal Grandmother    Diabetes Maternal Grandmother    Hypertension Mother    Diabetes Mother    Breast cancer Maternal Aunt    Hypertension Maternal Grandfather    Stroke Maternal Grandfather     Social History Social History   Tobacco Use   Smoking status: Former    Current packs/day: 0.00    Types: Cigarettes, Cigars    Start date: 08/30/2002    Quit date: 08/29/2012    Years since quitting: 11.1   Smokeless tobacco: Never   Tobacco  comments:    1-2 cigars daily  Vaping Use   Vaping status: Never Used  Substance Use Topics   Alcohol use: No   Drug use: Never     Allergies   Bactrim  [sulfamethoxazole -trimethoprim ]   Review of Systems Review of Systems Per HPI  Physical Exam Triage Vital Signs ED Triage Vitals [10/24/23 0943]  Encounter Vitals Group     BP 116/70     Girls Systolic BP Percentile      Girls Diastolic BP Percentile      Boys Systolic BP Percentile      Boys Diastolic BP Percentile      Pulse Rate 63     Resp 18     Temp 98 F (36.7 C)     Temp Source Oral     SpO2 98 %     Weight      Height      Head Circumference      Peak Flow      Pain Score      Pain Loc      Pain Education      Exclude from Growth Chart    No data found.  Updated Vital Signs BP 116/70 (BP Location: Left Arm)   Pulse 63   Temp 98 F (36.7 C) (Oral)   Resp 18   LMP 10/03/2023 (Approximate)   SpO2 98%   Visual Acuity Right Eye Distance:   Left Eye Distance:   Bilateral Distance:    Right Eye Near:   Left Eye Near:    Bilateral Near:     Physical Exam Vitals and nursing note reviewed.  Constitutional:      General: She is not in acute distress.    Appearance: She is well-developed.  HENT:     Head: Normocephalic.  Eyes:     Extraocular Movements: Extraocular movements intact.     Pupils: Pupils are equal, round, and reactive to light.  Cardiovascular:     Rate and Rhythm: Normal rate and regular rhythm.     Pulses: Normal pulses.     Heart sounds: Normal heart sounds.  Pulmonary:     Effort: Pulmonary effort is normal. No respiratory distress.     Breath sounds: Normal breath sounds. No stridor. No wheezing, rhonchi or rales.  Abdominal:     General: Bowel sounds are normal.     Palpations: Abdomen is soft.     Tenderness: There is no abdominal tenderness. There  is no right CVA tenderness or left CVA tenderness.  Musculoskeletal:     Cervical back: Normal range of motion.      Lumbar back: Tenderness present. No swelling, deformity or spasms. Decreased range of motion. Negative right straight leg raise test and negative left straight leg raise test.       Back:  Skin:    General: Skin is warm and dry.  Neurological:     General: No focal deficit present.     Mental Status: She is alert and oriented to person, place, and time.  Psychiatric:        Mood and Affect: Mood normal.        Behavior: Behavior normal.      UC Treatments / Results  Labs (all labs ordered are listed, but only abnormal results are displayed) Labs Reviewed  POCT URINE DIPSTICK - Abnormal; Notable for the following components:      Result Value   Leukocytes, UA Small (1+) (*)    All other components within normal limits  URINE CULTURE  CERVICOVAGINAL ANCILLARY ONLY    EKG   Radiology No results found.  Procedures Procedures (including critical care time)  Medications Ordered in UC Medications  ketorolac  (TORADOL ) 30 MG/ML injection 30 mg (has no administration in time range)  dexamethasone  (DECADRON ) injection 10 mg (has no administration in time range)    Initial Impression / Assessment and Plan / UC Course  I have reviewed the triage vital signs and the nursing notes.  Pertinent labs & imaging results that were available during my care of the patient were reviewed by me and considered in my medical decision making (see chart for details).  Decadron  10 mg IM and Toradol  30 mg IM administered for low back pain.  Patient reports that she is not breast-feeding.  Urinalysis was positive for leukocytes, urine culture is pending along with cytology swab.  Will start patient on prednisone  40 mg for the next 5 days for her back.  Supportive care recommendations were provided and discussed with the patient to include continuing over-the-counter Tylenol , the use of ice or heat, and gentle stretching exercises, with regard to her STI testing, patient advised to refrain from sexual  intercourse until her test results are received.  Patient also advised she will need to notify all partners if her test results were positive and to refrain from sexual intercourse for an additional 7 days after treatment if it is warranted.  Patient was given strict ER follow-up precautions, also recommended follow-up with orthopedic for chronic back pain.  Patient was in agreement with this plan of care and verbalizes understanding.  All questions were answered.  Patient stable for discharge.  Final Clinical Impressions(s) / UC Diagnoses   Final diagnoses:  Abdominal pain, unspecified abdominal location  Chronic bilateral low back pain without sciatica  Screening for STD (sexually transmitted disease)     Discharge Instructions      For your back pain: You were given injections of Decadron  10 mg and Toradol  30 mg.  Do not take any additional NSAIDs today such as ibuprofen , Aleve , Motrin , Advil , or naproxen .  Recommend Tylenol  for breakthrough pain or discomfort. A urine culture has been ordered.  You will be contacted if the pending test results are abnormal.  You also have access to your results via MyChart. Take medication as prescribed.  Continue to take Tylenol  while you are taking the prednisone . Try to remain as active as possible. Gentle range of motion and  stretching exercises to help with back spasm and pain. May apply ice or heat as needed.  Ice is recommended for pain or swelling, heat for spasm or stiffness.  Apply for 20 minutes, remove for 1 hour, then repeat. Go to the emergency department immediately if you develop weakness in your legs or feet, inability to walk, loss of bowel or bladder function, difficulty urinating or passing a bowel movement, or other concerns. As discussed, if symptoms fail to improve, recommend follow-up with your primary care physician.  Also recommend discussing referral to orthopedics for further evaluation.  For your STI testing: You will be  contacted if the pending test results are abnormal.  You also have access to your results via MyChart. Increase condom use with each sexual encounter. If your test results are positive, you will need to notify all partners.  If treatment is necessary, you will need to refrain from sexual intercourse for an additional 7 days after completing treatment.   Follow-up as needed.       ED Prescriptions     Medication Sig Dispense Auth. Provider   predniSONE  (DELTASONE ) 20 MG tablet Take 2 tablets (40 mg total) by mouth daily with breakfast for 5 days. 10 tablet Leath-Warren, Etta PARAS, NP      PDMP not reviewed this encounter.   Gilmer Etta PARAS, NP 10/24/23 [phone]

## 2023-10-25 LAB — CERVICOVAGINAL ANCILLARY ONLY
Bacterial Vaginitis (gardnerella): NEGATIVE
Candida Glabrata: NEGATIVE
Candida Vaginitis: NEGATIVE
Chlamydia: NEGATIVE
Comment: NEGATIVE
Comment: NEGATIVE
Comment: NEGATIVE
Comment: NEGATIVE
Comment: NEGATIVE
Comment: NORMAL
Neisseria Gonorrhea: NEGATIVE
Trichomonas: NEGATIVE

## 2023-10-26 LAB — URINE CULTURE

## 2023-10-27 ENCOUNTER — Ambulatory Visit (HOSPITAL_COMMUNITY): Payer: Self-pay

## 2024-02-01 ENCOUNTER — Other Ambulatory Visit (HOSPITAL_COMMUNITY)
Admission: RE | Admit: 2024-02-01 | Discharge: 2024-02-01 | Disposition: A | Discharge status: Home / Self Care | Source: Ambulatory Visit | Attending: Obstetrics & Gynecology | Admitting: Obstetrics & Gynecology

## 2024-02-01 ENCOUNTER — Ambulatory Visit: Admitting: *Deleted

## 2024-02-01 DIAGNOSIS — Z113 Encounter for screening for infections with a predominantly sexual mode of transmission: Secondary | ICD-10-CM | POA: Insufficient documentation

## 2024-02-01 NOTE — Progress Notes (Signed)
   NURSE VISIT- VAGINITIS/STD/POC  SUBJECTIVE:  [name] is a 38 y.o. [id number] GYN patientfemale here for a vaginal swab for STD screen.  She reports the following symptoms: none for 0 days. Denies abnormal vaginal bleeding, significant pelvic pain, fever, or UTI symptoms.  OBJECTIVE:  There were no vitals taken for this visit.  Appears well, in no apparent distress  ASSESSMENT: Vaginal swab for STD screen  PLAN: Self-collected vaginal probe for Gonorrhea, Chlamydia, Trichomonas, Bacterial Vaginosis, Yeast sent to lab Treatment: to be determined once results are received Follow-up as needed if symptoms persist/worsen, or new symptoms develop  Alan LITTIE Fischer  02/01/2024 3:57 PM

## 2024-02-02 LAB — CERVICOVAGINAL ANCILLARY ONLY
Bacterial Vaginitis (gardnerella): POSITIVE — AB
Candida Glabrata: NEGATIVE
Candida Vaginitis: NEGATIVE
Chlamydia: NEGATIVE
Comment: NEGATIVE
Comment: NEGATIVE
Comment: NEGATIVE
Comment: NEGATIVE
Comment: NEGATIVE
Comment: NORMAL
Neisseria Gonorrhea: NEGATIVE
Trichomonas: NEGATIVE

## 2024-02-03 ENCOUNTER — Ambulatory Visit: Payer: Self-pay | Admitting: Adult Health

## 2024-02-03 MED ORDER — METRONIDAZOLE 500 MG PO TABS: 500.0000 mg | ORAL_TABLET | Freq: Two times a day (BID) | ORAL | 0 refills | Status: AC

## 2024-02-17 ENCOUNTER — Other Ambulatory Visit: Payer: Self-pay | Admitting: Obstetrics & Gynecology

## 2024-02-17 DIAGNOSIS — N760 Acute vaginitis: Secondary | ICD-10-CM

## 2024-02-17 MED ORDER — FLUCONAZOLE 150 MG PO TABS: ORAL_TABLET | ORAL | 1 refills | Status: AC

## 2024-02-17 NOTE — Progress Notes (Signed)
 Rx for diflucan  Myna Hidalgo, DO Attending Obstetrician & Gynecologist, [facility] for [facility], [facility]

## 2024-03-10 ENCOUNTER — Emergency Department (HOSPITAL_COMMUNITY)
Admission: EM | Admit: 2024-03-10 | Discharge: 2024-03-11 | Disposition: A | Attending: Emergency Medicine | Admitting: Emergency Medicine

## 2024-03-10 ENCOUNTER — Other Ambulatory Visit: Payer: Self-pay

## 2024-03-10 ENCOUNTER — Encounter (HOSPITAL_COMMUNITY): Payer: Self-pay | Admitting: Emergency Medicine

## 2024-03-10 DIAGNOSIS — M545 Low back pain, unspecified: Secondary | ICD-10-CM | POA: Diagnosis present

## 2024-03-10 DIAGNOSIS — G8929 Other chronic pain: Secondary | ICD-10-CM | POA: Insufficient documentation

## 2024-03-10 DIAGNOSIS — N3000 Acute cystitis without hematuria: Secondary | ICD-10-CM | POA: Diagnosis not present

## 2024-03-10 LAB — URINALYSIS, ROUTINE W REFLEX MICROSCOPIC
Bilirubin Urine: NEGATIVE
Glucose, UA: NEGATIVE mg/dL
Hgb urine dipstick: NEGATIVE
Ketones, ur: NEGATIVE mg/dL
Nitrite: NEGATIVE
Protein, ur: NEGATIVE mg/dL
Specific Gravity, Urine: 1.021 (ref 1.005–1.030)
pH: 7 (ref 5.0–8.0)

## 2024-03-10 NOTE — ED Triage Notes (Signed)
 Pt here with c/o lower abdominal pain that radiates to lower back x 3-4 days. Pt denies any burning or pain with urination but states urinating has been more difficult.

## 2024-03-11 LAB — WET PREP, GENITAL
Clue Cells Wet Prep HPF POC: NONE SEEN
Sperm: NONE SEEN
Trich, Wet Prep: NONE SEEN
WBC, Wet Prep HPF POC: >=10 — AB
Yeast Wet Prep HPF POC: NONE SEEN

## 2024-03-11 MED ORDER — METHOCARBAMOL 500 MG PO TABS: 500.0000 mg | ORAL_TABLET | Freq: Two times a day (BID) | ORAL | 0 refills | Status: AC

## 2024-03-11 MED ORDER — IBUPROFEN 400 MG PO TABS
600.0000 mg | ORAL_TABLET | Freq: Once | ORAL | Status: AC
  Administered 2024-03-11: 600 mg via ORAL
  Filled 2024-03-11: qty 2

## 2024-03-11 MED ORDER — CEPHALEXIN 500 MG PO CAPS: 500.0000 mg | ORAL_CAPSULE | Freq: Two times a day (BID) | ORAL | 0 refills | Status: AC

## 2024-03-11 MED ORDER — CEPHALEXIN 500 MG PO CAPS
500.0000 mg | ORAL_CAPSULE | Freq: Once | ORAL | Status: AC
  Administered 2024-03-11: 500 mg via ORAL
  Filled 2024-03-11: qty 1

## 2024-03-11 MED ORDER — NAPROXEN 500 MG PO TABS: 500.0000 mg | ORAL_TABLET | Freq: Two times a day (BID) | ORAL | 0 refills | Status: AC

## 2024-03-11 NOTE — ED Provider Notes (Signed)
 "  [place] EMERGENCY DEPARTMENT AT [facility]  Provider Note  CSN: [id number] Arrival date & time: 03/10/24 2251  History Chief Complaint  Patient presents with   Abdominal Pain   Back Pain    [name] is a 39 y.o. female with history of chronic back pain reports she has had increased pain the last few days. She has been told she needs an MRI in the past, but has not had a chance to get that done yet. She also reports some lower abdominal discomfort. She was recently treated for BV at [facility] office and reports she thinks it may have returned. Not concerned for STI. Having some mild vaginal discharge now. She denies dysuria or urgency but reports she feels like she has to force herself to urinate. No fever, no vomiting.    Home Medications Prior to Admission medications  Medication Sig Start Date End Date Taking? Authorizing Provider  cephALEXin  (KEFLEX ) 500 MG capsule Take 1 capsule (500 mg total) by mouth 2 (two) times daily for 7 days. 03/11/24 03/18/24 Yes Roselyn Carlin NOVAK, MD  methocarbamol  (ROBAXIN ) 500 MG tablet Take 1 tablet (500 mg total) by mouth 2 (two) times daily. 03/11/24  Yes Roselyn Carlin NOVAK, MD  naproxen  (NAPROSYN ) 500 MG tablet Take 1 tablet (500 mg total) by mouth 2 (two) times daily. 03/11/24  Yes Roselyn Carlin NOVAK, MD  fluconazole  (DIFLUCAN ) 150 MG tablet Take 1 now and 1 in 3 days if needed 08/24/23   Signa Delon LABOR, NP  fluconazole  (DIFLUCAN ) 150 MG tablet Take one tablet then repeat in 3 days if needed 02/17/24   Ozan, Jennifer, DO  ibuprofen  (ADVIL ) 600 MG tablet Take 1 tablet (600 mg total) by mouth every 6 (six) hours as needed. 05/25/23   Erik Kieth BROCKS, MD  metroNIDAZOLE  (FLAGYL ) 500 MG tablet Take 1 tablet (500 mg total) by mouth 2 (two) times daily. 02/03/24   Signa Delon LABOR, NP  norethindrone -ethinyl estradiol -FE (JUNEL FE 1/20) 1-20 MG-MCG tablet Take 1 tablet by mouth daily. 07/04/23   Loreli Suzen BIRCH, CNM     Allergies     Bactrim  [sulfamethoxazole -trimethoprim ]   Review of Systems   Review of Systems Please see HPI for pertinent positives and negatives  Physical Exam BP 135/64 (BP Location: Left Arm)   Pulse 76   Temp 98.3 F (36.8 C) (Oral)   Resp 18   Ht 5' 4 (1.626 m)   Wt 93 kg   LMP 03/07/2024 (Exact Date)   SpO2 98%   BMI 35.19 kg/m   Physical Exam Vitals and nursing note reviewed.  Constitutional:      Appearance: Normal appearance.  HENT:     Head: Normocephalic and atraumatic.     Nose: Nose normal.     Mouth/Throat:     Mouth: Mucous membranes are moist.  Eyes:     Extraocular Movements: Extraocular movements intact.     Conjunctiva/sclera: Conjunctivae normal.  Cardiovascular:     Rate and Rhythm: Normal rate.  Pulmonary:     Effort: Pulmonary effort is normal.     Breath sounds: Normal breath sounds.  Abdominal:     General: Abdomen is flat.     Palpations: Abdomen is soft.     Tenderness: There is no abdominal tenderness.  Musculoskeletal:        General: No swelling. Normal range of motion.     Cervical back: Neck supple.  Skin:    General: Skin is warm and  dry.  Neurological:     General: No focal deficit present.     Mental Status: She is alert.  Psychiatric:        Mood and Affect: Mood normal.     ED Results / Procedures / Treatments   EKG None  Procedures Procedures  Medications Ordered in the ED Medications  cephALEXin  (KEFLEX ) capsule 500 mg (has no administration in time range)  ibuprofen  (ADVIL ) tablet 600 mg (has no administration in time range)    Initial Impression and Plan  Patient here with two unrelated complaints. Her back pain is MSK and chronic. No red flags or concern for acute cord compression. Will treat with NSAIDs, muscle relaxer and PCP follow up. As for her pelvic pain, UA is equivocal for UTI, will check pelvic swabs and treat accordingly.   ED Course   Clinical Course as of 03/11/24 0045  [place] Mar 11, 2024  0040 Wet  prep with WBC but no clue cells or yeast. Will treat for cystitis, recommend PCP/Gyn follow up if not improving.  [CS]    Clinical Course User Index [CS] Roselyn Carlin NOVAK, MD     MDM Rules/Calculators/A&P Medical Decision Making Problems Addressed: Acute cystitis without hematuria: acute illness or injury Chronic low back pain without sciatica, unspecified back pain laterality: chronic illness or injury with exacerbation, progression, or side effects of treatment  Amount and/or Complexity of Data Reviewed Labs: ordered. Decision-making details documented in ED Course.  Risk Prescription drug management.     Final Clinical Impression(s) / ED Diagnoses Final diagnoses:  Chronic low back pain without sciatica, unspecified back pain laterality  Acute cystitis without hematuria    Rx / DC Orders ED Discharge Orders          Ordered    cephALEXin  (KEFLEX ) 500 MG capsule  2 times daily        03/11/24 0044    naproxen  (NAPROSYN ) 500 MG tablet  2 times daily        03/11/24 0044    methocarbamol  (ROBAXIN ) 500 MG tablet  2 times daily        03/11/24 0044             Roselyn Carlin NOVAK, MD 03/11/24 0045  "

## 2024-03-12 LAB — GC/CHLAMYDIA PROBE AMP (~~LOC~~) NOT AT ARMC
Chlamydia: NEGATIVE
Comment: NEGATIVE
Comment: NORMAL
Neisseria Gonorrhea: NEGATIVE

## 2024-04-05 ENCOUNTER — Ambulatory Visit

## 2024-04-05 DIAGNOSIS — R82998 Other abnormal findings in urine: Secondary | ICD-10-CM

## 2024-04-05 DIAGNOSIS — Z87440 Personal history of urinary (tract) infections: Secondary | ICD-10-CM

## 2024-04-05 DIAGNOSIS — Z113 Encounter for screening for infections with a predominantly sexual mode of transmission: Secondary | ICD-10-CM

## 2024-04-05 DIAGNOSIS — Z3202 Encounter for pregnancy test, result negative: Secondary | ICD-10-CM

## 2024-04-05 DIAGNOSIS — N898 Other specified noninflammatory disorders of vagina: Secondary | ICD-10-CM

## 2024-04-05 LAB — POCT URINALYSIS DIPSTICK OB
Blood, UA: NEGATIVE
Glucose, UA: NEGATIVE
Ketones, UA: NEGATIVE
Nitrite, UA: NEGATIVE
POC,PROTEIN,UA: NEGATIVE

## 2024-04-05 LAB — POCT URINE PREGNANCY: Preg Test, Ur: NEGATIVE

## 2024-04-05 NOTE — Progress Notes (Signed)
" ° °  NURSE VISIT- VAGINITIS/STD/POC  SUBJECTIVE:  [name] is a 39 y.o. [id number] GYN patientfemale here for a vaginal swab for vaginitis screening.  She reports the following symptoms: discharge described as white for after taking antibiotics for UTI . Denies abnormal vaginal bleeding, significant pelvic pain, fever, or UTI symptoms. Patient wanted pregnancy test as well.  OBJECTIVE:  LMP 03/07/2024 (Exact Date)   Appears well, in no apparent distress  ASSESSMENT: Vaginal swab for vaginitis screening Urine dipped and negative for everything only had small leukocytes. Patient did not want to send urine wanted to see what vaginal swab said first.  Pregnancy test negative.  PLAN: Self-collected vaginal probe for Gonorrhea, Chlamydia, Trichomonas, Bacterial Vaginosis, Yeast sent to lab Treatment: to be determined once results are received Follow-up as needed if symptoms persist/worsen, or new symptoms develop  Aleck FORBES Blase  04/05/2024 3:36 PM  "
